# Patient Record
Sex: Male | Born: 1940 | Race: White | Hispanic: No | Smoking: Former smoker
Health system: Southern US, Community
[De-identification: ages and names within clinical notes are randomized; demographics above are authoritative.]

## PROBLEM LIST (undated history)

## (undated) DIAGNOSIS — N183 Chronic kidney disease, stage 3 unspecified: Secondary | ICD-10-CM

## (undated) DIAGNOSIS — I10 Essential (primary) hypertension: Secondary | ICD-10-CM

## (undated) DIAGNOSIS — G4730 Sleep apnea, unspecified: Secondary | ICD-10-CM

## (undated) DIAGNOSIS — R42 Dizziness and giddiness: Secondary | ICD-10-CM

## (undated) DIAGNOSIS — K219 Gastro-esophageal reflux disease without esophagitis: Secondary | ICD-10-CM

## (undated) DIAGNOSIS — E785 Hyperlipidemia, unspecified: Secondary | ICD-10-CM

## (undated) DIAGNOSIS — I82409 Acute embolism and thrombosis of unspecified deep veins of unspecified lower extremity: Secondary | ICD-10-CM

## (undated) DIAGNOSIS — E669 Obesity, unspecified: Secondary | ICD-10-CM

## (undated) DIAGNOSIS — N200 Calculus of kidney: Secondary | ICD-10-CM

## (undated) DIAGNOSIS — R011 Cardiac murmur, unspecified: Secondary | ICD-10-CM

## (undated) DIAGNOSIS — E119 Type 2 diabetes mellitus without complications: Secondary | ICD-10-CM

## (undated) DIAGNOSIS — M720 Palmar fascial fibromatosis [Dupuytren]: Secondary | ICD-10-CM

## (undated) DIAGNOSIS — I6529 Occlusion and stenosis of unspecified carotid artery: Secondary | ICD-10-CM

## (undated) DIAGNOSIS — I2699 Other pulmonary embolism without acute cor pulmonale: Secondary | ICD-10-CM

## (undated) DIAGNOSIS — T4145XA Adverse effect of unspecified anesthetic, initial encounter: Secondary | ICD-10-CM

## (undated) DIAGNOSIS — M1990 Unspecified osteoarthritis, unspecified site: Secondary | ICD-10-CM

## (undated) DIAGNOSIS — I2510 Atherosclerotic heart disease of native coronary artery without angina pectoris: Secondary | ICD-10-CM

## (undated) DIAGNOSIS — T8859XA Other complications of anesthesia, initial encounter: Secondary | ICD-10-CM

## (undated) DIAGNOSIS — I5022 Chronic systolic (congestive) heart failure: Secondary | ICD-10-CM

## (undated) DIAGNOSIS — Z95810 Presence of automatic (implantable) cardiac defibrillator: Secondary | ICD-10-CM

## (undated) DIAGNOSIS — I255 Ischemic cardiomyopathy: Secondary | ICD-10-CM

## (undated) HISTORY — DX: Hyperlipidemia, unspecified: E78.5

## (undated) HISTORY — DX: Essential (primary) hypertension: I10

## (undated) HISTORY — PX: CARDIAC CATHETERIZATION: SHX172

## (undated) HISTORY — DX: Occlusion and stenosis of unspecified carotid artery: I65.29

## (undated) HISTORY — PX: EYE SURGERY: SHX253

## (undated) HISTORY — DX: Obesity, unspecified: E66.9

## (undated) HISTORY — PX: LITHOTRIPSY: SUR834

## (undated) HISTORY — DX: Calculus of kidney: N20.0

## (undated) HISTORY — DX: Sleep apnea, unspecified: G47.30

## (undated) HISTORY — PX: CYSTOSCOPY W/ STONE MANIPULATION: SHX1427

## (undated) HISTORY — PX: CORONARY ARTERY BYPASS GRAFT: SHX141

## (undated) HISTORY — DX: Dizziness and giddiness: R42

## (undated) HISTORY — PX: TONSILLECTOMY: SUR1361

## (undated) HISTORY — DX: Palmar fascial fibromatosis (dupuytren): M72.0

## (undated) HISTORY — DX: Atherosclerotic heart disease of native coronary artery without angina pectoris: I25.10

---

## 2003-09-26 HISTORY — PX: CATARACT EXTRACTION W/ INTRAOCULAR LENS  IMPLANT, BILATERAL: SHX1307

## 2003-09-26 HISTORY — PX: CARDIAC DEFIBRILLATOR PLACEMENT: SHX171

## 2004-07-27 ENCOUNTER — Ambulatory Visit: Payer: Self-pay | Admitting: Internal Medicine

## 2004-08-02 ENCOUNTER — Ambulatory Visit: Payer: Self-pay | Admitting: Internal Medicine

## 2004-08-10 ENCOUNTER — Ambulatory Visit: Payer: Self-pay | Admitting: Internal Medicine

## 2004-08-24 ENCOUNTER — Ambulatory Visit: Payer: Self-pay | Admitting: Internal Medicine

## 2004-08-31 ENCOUNTER — Ambulatory Visit: Payer: Self-pay | Admitting: Internal Medicine

## 2004-09-08 ENCOUNTER — Ambulatory Visit: Payer: Self-pay

## 2004-09-16 ENCOUNTER — Ambulatory Visit: Payer: Self-pay | Admitting: Internal Medicine

## 2004-09-27 ENCOUNTER — Ambulatory Visit: Payer: Self-pay | Admitting: Internal Medicine

## 2004-09-28 ENCOUNTER — Ambulatory Visit: Payer: Self-pay | Admitting: Internal Medicine

## 2004-09-29 ENCOUNTER — Ambulatory Visit: Payer: Self-pay | Admitting: Internal Medicine

## 2004-10-28 ENCOUNTER — Ambulatory Visit: Payer: Self-pay | Admitting: Internal Medicine

## 2004-11-14 ENCOUNTER — Ambulatory Visit: Payer: Self-pay | Admitting: Gastroenterology

## 2004-11-21 ENCOUNTER — Ambulatory Visit: Payer: Self-pay | Admitting: Internal Medicine

## 2004-11-25 ENCOUNTER — Ambulatory Visit (HOSPITAL_COMMUNITY): Admission: RE | Admit: 2004-11-25 | Discharge: 2004-11-25 | Payer: Self-pay | Admitting: Gastroenterology

## 2004-11-25 ENCOUNTER — Ambulatory Visit: Payer: Self-pay | Admitting: Gastroenterology

## 2004-12-05 ENCOUNTER — Ambulatory Visit: Payer: Self-pay | Admitting: Internal Medicine

## 2004-12-12 ENCOUNTER — Ambulatory Visit: Payer: Self-pay | Admitting: Family Medicine

## 2004-12-16 ENCOUNTER — Ambulatory Visit: Payer: Self-pay | Admitting: Gastroenterology

## 2004-12-19 ENCOUNTER — Ambulatory Visit: Payer: Self-pay | Admitting: Internal Medicine

## 2005-01-16 ENCOUNTER — Ambulatory Visit: Payer: Self-pay | Admitting: Internal Medicine

## 2005-01-27 ENCOUNTER — Ambulatory Visit: Payer: Self-pay | Admitting: Internal Medicine

## 2005-02-03 ENCOUNTER — Ambulatory Visit: Payer: Self-pay | Admitting: Family Medicine

## 2005-02-09 ENCOUNTER — Ambulatory Visit: Payer: Self-pay | Admitting: Internal Medicine

## 2005-03-13 ENCOUNTER — Ambulatory Visit: Payer: Self-pay | Admitting: Internal Medicine

## 2005-03-15 ENCOUNTER — Ambulatory Visit: Payer: Self-pay | Admitting: Internal Medicine

## 2005-03-21 ENCOUNTER — Ambulatory Visit: Payer: Self-pay | Admitting: Internal Medicine

## 2005-04-28 ENCOUNTER — Ambulatory Visit: Payer: Self-pay | Admitting: Internal Medicine

## 2005-05-16 ENCOUNTER — Ambulatory Visit: Payer: Self-pay | Admitting: Internal Medicine

## 2005-05-19 ENCOUNTER — Ambulatory Visit: Payer: Self-pay | Admitting: Internal Medicine

## 2005-06-16 ENCOUNTER — Ambulatory Visit: Payer: Self-pay | Admitting: Internal Medicine

## 2005-06-26 ENCOUNTER — Ambulatory Visit: Payer: Self-pay

## 2005-07-04 ENCOUNTER — Ambulatory Visit: Payer: Self-pay | Admitting: Internal Medicine

## 2005-07-12 ENCOUNTER — Ambulatory Visit: Payer: Self-pay | Admitting: Internal Medicine

## 2005-08-10 ENCOUNTER — Ambulatory Visit: Payer: Self-pay | Admitting: Internal Medicine

## 2005-08-29 ENCOUNTER — Ambulatory Visit: Payer: Self-pay | Admitting: Internal Medicine

## 2005-09-05 ENCOUNTER — Ambulatory Visit: Payer: Self-pay | Admitting: Internal Medicine

## 2005-09-28 ENCOUNTER — Ambulatory Visit: Payer: Self-pay | Admitting: Internal Medicine

## 2005-10-26 ENCOUNTER — Ambulatory Visit: Payer: Self-pay | Admitting: Internal Medicine

## 2005-11-07 ENCOUNTER — Ambulatory Visit: Payer: Self-pay | Admitting: Internal Medicine

## 2005-11-27 ENCOUNTER — Ambulatory Visit: Payer: Self-pay | Admitting: Internal Medicine

## 2005-12-14 ENCOUNTER — Ambulatory Visit: Payer: Self-pay | Admitting: Family Medicine

## 2005-12-15 ENCOUNTER — Ambulatory Visit: Payer: Self-pay | Admitting: Family Medicine

## 2005-12-18 ENCOUNTER — Ambulatory Visit: Payer: Self-pay | Admitting: Cardiology

## 2005-12-25 ENCOUNTER — Ambulatory Visit: Payer: Self-pay | Admitting: Internal Medicine

## 2005-12-28 ENCOUNTER — Ambulatory Visit: Payer: Self-pay

## 2005-12-28 ENCOUNTER — Encounter: Payer: Self-pay | Admitting: Internal Medicine

## 2006-01-01 ENCOUNTER — Ambulatory Visit: Payer: Self-pay | Admitting: Internal Medicine

## 2006-01-08 ENCOUNTER — Ambulatory Visit: Payer: Self-pay | Admitting: Internal Medicine

## 2006-01-12 ENCOUNTER — Ambulatory Visit: Payer: Self-pay | Admitting: Internal Medicine

## 2006-01-30 ENCOUNTER — Ambulatory Visit (HOSPITAL_COMMUNITY): Admission: RE | Admit: 2006-01-30 | Discharge: 2006-01-30 | Payer: Self-pay | Admitting: Neurology

## 2006-02-05 ENCOUNTER — Ambulatory Visit: Payer: Self-pay | Admitting: Internal Medicine

## 2006-02-08 ENCOUNTER — Encounter: Payer: Self-pay | Admitting: Internal Medicine

## 2006-02-13 ENCOUNTER — Ambulatory Visit (HOSPITAL_COMMUNITY): Admission: RE | Admit: 2006-02-13 | Discharge: 2006-02-13 | Payer: Self-pay | Admitting: Neurology

## 2006-02-20 ENCOUNTER — Ambulatory Visit: Payer: Self-pay | Admitting: Internal Medicine

## 2006-03-21 ENCOUNTER — Ambulatory Visit: Payer: Self-pay | Admitting: Internal Medicine

## 2006-04-03 ENCOUNTER — Ambulatory Visit: Payer: Self-pay | Admitting: Internal Medicine

## 2006-04-19 ENCOUNTER — Ambulatory Visit: Payer: Self-pay | Admitting: Internal Medicine

## 2006-04-27 ENCOUNTER — Ambulatory Visit: Payer: Self-pay | Admitting: Internal Medicine

## 2006-05-31 ENCOUNTER — Ambulatory Visit: Payer: Self-pay | Admitting: Internal Medicine

## 2006-07-05 ENCOUNTER — Encounter: Payer: Self-pay | Admitting: Internal Medicine

## 2006-08-02 ENCOUNTER — Ambulatory Visit: Payer: Self-pay | Admitting: Internal Medicine

## 2006-08-20 ENCOUNTER — Ambulatory Visit: Payer: Self-pay | Admitting: Internal Medicine

## 2006-08-28 ENCOUNTER — Ambulatory Visit: Payer: Self-pay | Admitting: Internal Medicine

## 2006-08-28 ENCOUNTER — Encounter: Payer: Self-pay | Admitting: Internal Medicine

## 2006-08-28 ENCOUNTER — Ambulatory Visit: Payer: Self-pay

## 2006-09-14 ENCOUNTER — Ambulatory Visit: Payer: Self-pay | Admitting: Internal Medicine

## 2006-10-03 DIAGNOSIS — I2699 Other pulmonary embolism without acute cor pulmonale: Secondary | ICD-10-CM

## 2006-10-03 DIAGNOSIS — M720 Palmar fascial fibromatosis [Dupuytren]: Secondary | ICD-10-CM | POA: Insufficient documentation

## 2006-10-03 DIAGNOSIS — Z87442 Personal history of urinary calculi: Secondary | ICD-10-CM | POA: Insufficient documentation

## 2006-10-04 ENCOUNTER — Ambulatory Visit: Payer: Self-pay | Admitting: Internal Medicine

## 2006-10-22 ENCOUNTER — Ambulatory Visit: Payer: Self-pay | Admitting: Internal Medicine

## 2006-12-31 ENCOUNTER — Ambulatory Visit: Payer: Self-pay | Admitting: Internal Medicine

## 2007-01-03 ENCOUNTER — Ambulatory Visit: Payer: Self-pay | Admitting: Internal Medicine

## 2007-01-11 ENCOUNTER — Ambulatory Visit: Payer: Self-pay | Admitting: Internal Medicine

## 2007-01-11 LAB — CONVERTED CEMR LAB: INR: 2

## 2007-02-06 ENCOUNTER — Ambulatory Visit: Payer: Self-pay | Admitting: Internal Medicine

## 2007-02-08 ENCOUNTER — Ambulatory Visit: Payer: Self-pay | Admitting: Internal Medicine

## 2007-02-08 LAB — CONVERTED CEMR LAB: Prothrombin Time: 27.6 s — ABNORMAL HIGH (ref 11.6–15.2)

## 2007-02-14 ENCOUNTER — Ambulatory Visit: Payer: Self-pay | Admitting: Internal Medicine

## 2007-02-14 ENCOUNTER — Ambulatory Visit: Payer: Self-pay

## 2007-02-14 LAB — CONVERTED CEMR LAB
AST: 19 units/L (ref 0–37)
Albumin: 3.7 g/dL (ref 3.5–5.2)
Alkaline Phosphatase: 52 units/L (ref 39–117)
BUN: 26 mg/dL — ABNORMAL HIGH (ref 6–23)
CO2: 28 meq/L (ref 19–32)
Calcium: 9.6 mg/dL (ref 8.4–10.5)
Chloride: 107 meq/L (ref 96–112)
Potassium: 4.4 meq/L (ref 3.5–5.1)
Sodium: 142 meq/L (ref 135–145)
Total Bilirubin: 0.9 mg/dL (ref 0.3–1.2)
Total CHOL/HDL Ratio: 4.2
Triglycerides: 190 mg/dL — ABNORMAL HIGH (ref 0–149)

## 2007-03-21 ENCOUNTER — Ambulatory Visit: Payer: Self-pay | Admitting: Internal Medicine

## 2007-03-21 LAB — CONVERTED CEMR LAB: INR: 4.5

## 2007-03-25 ENCOUNTER — Encounter: Payer: Self-pay | Admitting: Internal Medicine

## 2007-03-26 ENCOUNTER — Ambulatory Visit: Payer: Self-pay | Admitting: Internal Medicine

## 2007-04-04 ENCOUNTER — Ambulatory Visit: Payer: Self-pay | Admitting: Internal Medicine

## 2007-04-04 LAB — CONVERTED CEMR LAB: INR: 2.8

## 2007-04-23 ENCOUNTER — Ambulatory Visit: Payer: Self-pay | Admitting: Internal Medicine

## 2007-04-25 ENCOUNTER — Ambulatory Visit: Payer: Self-pay | Admitting: Family Medicine

## 2007-06-04 ENCOUNTER — Ambulatory Visit: Payer: Self-pay | Admitting: Internal Medicine

## 2007-06-04 LAB — CONVERTED CEMR LAB: INR: 1.9

## 2007-06-06 ENCOUNTER — Encounter: Payer: Self-pay | Admitting: Internal Medicine

## 2007-07-04 ENCOUNTER — Telehealth (INDEPENDENT_AMBULATORY_CARE_PROVIDER_SITE_OTHER): Payer: Self-pay | Admitting: *Deleted

## 2007-07-04 ENCOUNTER — Ambulatory Visit: Payer: Self-pay | Admitting: Internal Medicine

## 2007-07-16 ENCOUNTER — Ambulatory Visit: Payer: Self-pay | Admitting: Internal Medicine

## 2007-07-17 ENCOUNTER — Encounter: Payer: Self-pay | Admitting: Internal Medicine

## 2007-07-30 ENCOUNTER — Ambulatory Visit: Payer: Self-pay | Admitting: Internal Medicine

## 2007-07-30 LAB — CONVERTED CEMR LAB
ALT: 18 units/L (ref 0–53)
Albumin: 3.9 g/dL (ref 3.5–5.2)
GFR calc non Af Amer: 38 mL/min
HDL: 35.6 mg/dL — ABNORMAL LOW (ref >39.0)
LDL Cholesterol: 47 mg/dL (ref 0–99)
Potassium: 4.3 meq/L (ref 3.5–5.1)
Sodium: 141 meq/L (ref 135–145)
Triglycerides: 149 mg/dL (ref 0–149)
VLDL: 30 mg/dL (ref 0–40)

## 2007-07-31 ENCOUNTER — Ambulatory Visit: Payer: Self-pay | Admitting: Internal Medicine

## 2007-08-12 ENCOUNTER — Ambulatory Visit: Payer: Self-pay | Admitting: Internal Medicine

## 2007-08-23 ENCOUNTER — Telehealth (INDEPENDENT_AMBULATORY_CARE_PROVIDER_SITE_OTHER): Payer: Self-pay | Admitting: *Deleted

## 2007-09-09 ENCOUNTER — Ambulatory Visit: Payer: Self-pay | Admitting: Internal Medicine

## 2007-10-03 ENCOUNTER — Ambulatory Visit: Payer: Self-pay | Admitting: Internal Medicine

## 2007-10-22 ENCOUNTER — Ambulatory Visit: Payer: Self-pay | Admitting: Internal Medicine

## 2007-10-30 ENCOUNTER — Encounter: Payer: Self-pay | Admitting: Internal Medicine

## 2007-11-04 ENCOUNTER — Encounter: Payer: Self-pay | Admitting: Internal Medicine

## 2007-12-02 ENCOUNTER — Ambulatory Visit: Payer: Self-pay | Admitting: Internal Medicine

## 2007-12-02 LAB — CONVERTED CEMR LAB
Albumin: 3.5 g/dL (ref 3.5–5.2)
Alkaline Phosphatase: 35 units/L — ABNORMAL LOW (ref 39–117)
BUN: 23 mg/dL (ref 6–23)
Bilirubin, Direct: 0.1 mg/dL (ref 0.0–0.3)
Cholesterol: 126 mg/dL (ref 0–200)
GFR calc Af Amer: 52 mL/min
HDL: 34.7 mg/dL — ABNORMAL LOW (ref >39.0)
Potassium: 4.5 meq/L (ref 3.5–5.1)
Triglycerides: 200 mg/dL — ABNORMAL HIGH (ref 0–149)
VLDL: 40 mg/dL (ref 0–40)

## 2007-12-03 ENCOUNTER — Ambulatory Visit: Payer: Self-pay | Admitting: Internal Medicine

## 2007-12-13 ENCOUNTER — Telehealth (INDEPENDENT_AMBULATORY_CARE_PROVIDER_SITE_OTHER): Payer: Self-pay | Admitting: *Deleted

## 2007-12-26 ENCOUNTER — Ambulatory Visit: Payer: Self-pay | Admitting: Internal Medicine

## 2007-12-26 DIAGNOSIS — R519 Headache, unspecified: Secondary | ICD-10-CM | POA: Insufficient documentation

## 2007-12-26 DIAGNOSIS — E119 Type 2 diabetes mellitus without complications: Secondary | ICD-10-CM

## 2007-12-26 DIAGNOSIS — K219 Gastro-esophageal reflux disease without esophagitis: Secondary | ICD-10-CM

## 2007-12-26 DIAGNOSIS — N259 Disorder resulting from impaired renal tubular function, unspecified: Secondary | ICD-10-CM | POA: Insufficient documentation

## 2007-12-26 DIAGNOSIS — R42 Dizziness and giddiness: Secondary | ICD-10-CM

## 2007-12-26 DIAGNOSIS — R51 Headache: Secondary | ICD-10-CM

## 2007-12-31 ENCOUNTER — Telehealth (INDEPENDENT_AMBULATORY_CARE_PROVIDER_SITE_OTHER): Payer: Self-pay | Admitting: *Deleted

## 2007-12-31 LAB — CONVERTED CEMR LAB: Hgb A1c MFr Bld: 7.3 % — ABNORMAL HIGH (ref 4.6–6.0)

## 2008-01-02 ENCOUNTER — Ambulatory Visit: Payer: Self-pay | Admitting: Internal Medicine

## 2008-01-06 ENCOUNTER — Ambulatory Visit: Payer: Self-pay | Admitting: Internal Medicine

## 2008-01-06 LAB — CONVERTED CEMR LAB: INR: 4

## 2008-01-16 ENCOUNTER — Telehealth (INDEPENDENT_AMBULATORY_CARE_PROVIDER_SITE_OTHER): Payer: Self-pay | Admitting: *Deleted

## 2008-02-06 ENCOUNTER — Ambulatory Visit: Payer: Self-pay | Admitting: Internal Medicine

## 2008-02-10 ENCOUNTER — Ambulatory Visit: Payer: Self-pay | Admitting: Internal Medicine

## 2008-02-10 LAB — CONVERTED CEMR LAB: INR: 3.3

## 2008-02-11 ENCOUNTER — Encounter: Payer: Self-pay | Admitting: Internal Medicine

## 2008-02-25 ENCOUNTER — Ambulatory Visit: Payer: Self-pay | Admitting: Internal Medicine

## 2008-02-25 LAB — CONVERTED CEMR LAB: INR: 2.1

## 2008-03-03 ENCOUNTER — Encounter: Admission: RE | Admit: 2008-03-03 | Discharge: 2008-03-03 | Payer: Self-pay | Admitting: Internal Medicine

## 2008-03-05 ENCOUNTER — Encounter: Payer: Self-pay | Admitting: Internal Medicine

## 2008-03-24 ENCOUNTER — Ambulatory Visit: Payer: Self-pay | Admitting: Internal Medicine

## 2008-03-24 LAB — CONVERTED CEMR LAB: INR: 1.9

## 2008-03-31 ENCOUNTER — Ambulatory Visit: Payer: Self-pay | Admitting: Internal Medicine

## 2008-04-07 ENCOUNTER — Ambulatory Visit: Payer: Self-pay | Admitting: Internal Medicine

## 2008-04-07 LAB — CONVERTED CEMR LAB: INR: 18.1

## 2008-05-05 ENCOUNTER — Ambulatory Visit: Payer: Self-pay | Admitting: Internal Medicine

## 2008-05-05 LAB — CONVERTED CEMR LAB: INR: 2.9

## 2008-06-08 ENCOUNTER — Ambulatory Visit: Payer: Self-pay | Admitting: Internal Medicine

## 2008-06-08 LAB — CONVERTED CEMR LAB
INR: 20.8
Prothrombin Time: 3 s

## 2008-06-25 ENCOUNTER — Ambulatory Visit: Payer: Self-pay | Admitting: Internal Medicine

## 2008-07-17 ENCOUNTER — Ambulatory Visit: Payer: Self-pay | Admitting: Internal Medicine

## 2008-07-17 LAB — CONVERTED CEMR LAB: Prothrombin Time: 19.7 s

## 2008-08-05 ENCOUNTER — Ambulatory Visit: Payer: Self-pay | Admitting: Internal Medicine

## 2008-08-11 ENCOUNTER — Ambulatory Visit: Payer: Self-pay

## 2008-08-11 ENCOUNTER — Encounter: Payer: Self-pay | Admitting: Internal Medicine

## 2008-08-18 ENCOUNTER — Telehealth: Payer: Self-pay | Admitting: Internal Medicine

## 2008-08-21 ENCOUNTER — Ambulatory Visit: Payer: Self-pay | Admitting: Internal Medicine

## 2008-08-24 ENCOUNTER — Ambulatory Visit: Payer: Self-pay | Admitting: Internal Medicine

## 2008-08-24 LAB — CONVERTED CEMR LAB: Prothrombin Time: 18 s

## 2008-08-28 ENCOUNTER — Ambulatory Visit: Payer: Self-pay

## 2008-08-28 ENCOUNTER — Ambulatory Visit: Payer: Self-pay | Admitting: Internal Medicine

## 2008-08-28 ENCOUNTER — Encounter: Payer: Self-pay | Admitting: Internal Medicine

## 2008-08-28 LAB — CONVERTED CEMR LAB
AST: 21 units/L (ref 0–37)
Albumin: 3.7 g/dL (ref 3.5–5.2)
BUN: 26 mg/dL — ABNORMAL HIGH (ref 6–23)
Calcium: 9.2 mg/dL (ref 8.4–10.5)
Cholesterol: 131 mg/dL (ref 0–200)
Creatinine, Ser: 1.7 mg/dL — ABNORMAL HIGH (ref 0.4–1.5)
Eosinophils Absolute: 0.2 10*3/uL (ref 0.0–0.7)
Eosinophils Relative: 4.6 % (ref 0.0–5.0)
GFR calc Af Amer: 52 mL/min
GFR calc non Af Amer: 43 mL/min
HCT: 40 % (ref 39.0–52.0)
MCV: 91.7 fL (ref 78.0–100.0)
Monocytes Absolute: 0.5 10*3/uL (ref 0.1–1.0)
Monocytes Relative: 9.7 % (ref 3.0–12.0)
Neutro Abs: 3.2 10*3/uL (ref 1.4–7.7)
RBC: 4.37 M/uL (ref 4.22–5.81)
Total Bilirubin: 0.8 mg/dL (ref 0.3–1.2)
WBC: 5.1 10*3/uL (ref 4.5–10.5)

## 2008-10-01 ENCOUNTER — Ambulatory Visit: Payer: Self-pay | Admitting: Internal Medicine

## 2008-10-06 ENCOUNTER — Ambulatory Visit: Payer: Self-pay | Admitting: Internal Medicine

## 2008-10-06 LAB — CONVERTED CEMR LAB: Prothrombin Time: 22 s

## 2008-10-20 ENCOUNTER — Ambulatory Visit: Payer: Self-pay | Admitting: Internal Medicine

## 2008-10-20 LAB — CONVERTED CEMR LAB: Prothrombin Time: 21.8 s

## 2008-11-10 ENCOUNTER — Ambulatory Visit: Payer: Self-pay | Admitting: Internal Medicine

## 2008-11-10 LAB — CONVERTED CEMR LAB: Prothrombin Time: 18.2 s

## 2008-11-17 ENCOUNTER — Ambulatory Visit: Payer: Self-pay | Admitting: Internal Medicine

## 2008-11-17 ENCOUNTER — Encounter: Payer: Self-pay | Admitting: Internal Medicine

## 2008-11-17 DIAGNOSIS — I25810 Atherosclerosis of coronary artery bypass graft(s) without angina pectoris: Secondary | ICD-10-CM

## 2008-11-17 DIAGNOSIS — I5022 Chronic systolic (congestive) heart failure: Secondary | ICD-10-CM

## 2008-11-17 DIAGNOSIS — E785 Hyperlipidemia, unspecified: Secondary | ICD-10-CM | POA: Insufficient documentation

## 2008-11-24 ENCOUNTER — Ambulatory Visit: Payer: Self-pay | Admitting: Internal Medicine

## 2008-11-24 LAB — CONVERTED CEMR LAB
ALT: 38 units/L (ref 0–53)
Alkaline Phosphatase: 33 units/L — ABNORMAL LOW (ref 39–117)
Bilirubin, Direct: 0.1 mg/dL (ref 0.0–0.3)
CO2: 26 meq/L (ref 19–32)
Chloride: 107 meq/L (ref 96–112)
GFR calc Af Amer: 56 mL/min
Pro B Natriuretic peptide (BNP): 82 pg/mL (ref 0.0–100.0)
Sodium: 140 meq/L (ref 135–145)
Total Protein: 7.1 g/dL (ref 6.0–8.3)

## 2008-12-01 ENCOUNTER — Ambulatory Visit: Payer: Self-pay | Admitting: Internal Medicine

## 2008-12-01 LAB — CONVERTED CEMR LAB
BUN: 38 mg/dL — ABNORMAL HIGH (ref 6–23)
Calcium: 9.4 mg/dL (ref 8.4–10.5)
Creatinine, Ser: 1.6 mg/dL — ABNORMAL HIGH (ref 0.4–1.5)
Glucose, Bld: 194 mg/dL — ABNORMAL HIGH (ref 70–99)
Sodium: 138 meq/L (ref 135–145)

## 2008-12-15 ENCOUNTER — Ambulatory Visit: Payer: Self-pay | Admitting: Internal Medicine

## 2008-12-15 LAB — CONVERTED CEMR LAB
BUN: 29 mg/dL — ABNORMAL HIGH (ref 6–23)
Calcium: 9.6 mg/dL (ref 8.4–10.5)
Creatinine, Ser: 1.6 mg/dL — ABNORMAL HIGH (ref 0.4–1.5)
Glucose, Bld: 164 mg/dL — ABNORMAL HIGH (ref 70–99)
Pro B Natriuretic peptide (BNP): 153 pg/mL — ABNORMAL HIGH (ref 0.0–100.0)
Sodium: 141 meq/L (ref 135–145)

## 2008-12-16 ENCOUNTER — Ambulatory Visit: Payer: Self-pay | Admitting: Internal Medicine

## 2008-12-22 ENCOUNTER — Encounter: Payer: Self-pay | Admitting: Internal Medicine

## 2008-12-31 ENCOUNTER — Ambulatory Visit: Payer: Self-pay | Admitting: Internal Medicine

## 2009-01-27 ENCOUNTER — Ambulatory Visit: Payer: Self-pay | Admitting: Internal Medicine

## 2009-01-27 LAB — CONVERTED CEMR LAB: INR: 2.7

## 2009-02-10 ENCOUNTER — Encounter: Payer: Self-pay | Admitting: Internal Medicine

## 2009-02-16 ENCOUNTER — Ambulatory Visit: Payer: Self-pay | Admitting: Internal Medicine

## 2009-02-16 ENCOUNTER — Encounter: Payer: Self-pay | Admitting: Internal Medicine

## 2009-02-18 ENCOUNTER — Ambulatory Visit: Payer: Self-pay | Admitting: Internal Medicine

## 2009-02-18 ENCOUNTER — Encounter: Payer: Self-pay | Admitting: Internal Medicine

## 2009-02-24 ENCOUNTER — Encounter: Payer: Self-pay | Admitting: Internal Medicine

## 2009-03-18 ENCOUNTER — Ambulatory Visit: Payer: Self-pay | Admitting: Family Medicine

## 2009-03-23 ENCOUNTER — Ambulatory Visit: Payer: Self-pay | Admitting: Internal Medicine

## 2009-03-24 ENCOUNTER — Ambulatory Visit: Payer: Self-pay | Admitting: Internal Medicine

## 2009-03-24 LAB — CONVERTED CEMR LAB
INR: 2.4
Prothrombin Time: 18.8 s

## 2009-04-23 ENCOUNTER — Ambulatory Visit: Payer: Self-pay | Admitting: Internal Medicine

## 2009-04-23 LAB — CONVERTED CEMR LAB: Prothrombin Time: 33.1 s

## 2009-04-26 ENCOUNTER — Ambulatory Visit: Payer: Self-pay | Admitting: Internal Medicine

## 2009-04-26 LAB — CONVERTED CEMR LAB
INR: 3.3
Prothrombin Time: 21.8 s

## 2009-05-10 ENCOUNTER — Ambulatory Visit: Payer: Self-pay | Admitting: Internal Medicine

## 2009-05-13 ENCOUNTER — Telehealth: Payer: Self-pay | Admitting: Internal Medicine

## 2009-05-20 ENCOUNTER — Ambulatory Visit: Payer: Self-pay | Admitting: Internal Medicine

## 2009-05-20 LAB — CONVERTED CEMR LAB
INR: 2.3
Prothrombin Time: 18.4 s

## 2009-06-02 ENCOUNTER — Ambulatory Visit: Payer: Self-pay | Admitting: Family Medicine

## 2009-06-02 LAB — CONVERTED CEMR LAB: INR: 2.6

## 2009-06-21 ENCOUNTER — Ambulatory Visit: Payer: Self-pay | Admitting: Internal Medicine

## 2009-06-21 LAB — CONVERTED CEMR LAB: INR: 1.6

## 2009-06-24 ENCOUNTER — Ambulatory Visit: Payer: Self-pay | Admitting: Internal Medicine

## 2009-06-28 ENCOUNTER — Ambulatory Visit: Payer: Self-pay | Admitting: Internal Medicine

## 2009-07-20 ENCOUNTER — Ambulatory Visit: Payer: Self-pay | Admitting: Internal Medicine

## 2009-07-20 LAB — CONVERTED CEMR LAB
INR: 1.7
Prothrombin Time: 16.2 s

## 2009-08-10 ENCOUNTER — Ambulatory Visit: Payer: Self-pay | Admitting: Internal Medicine

## 2009-08-10 LAB — CONVERTED CEMR LAB: Prothrombin Time: 15.6 s

## 2009-08-13 ENCOUNTER — Encounter (INDEPENDENT_AMBULATORY_CARE_PROVIDER_SITE_OTHER): Payer: Self-pay | Admitting: *Deleted

## 2009-08-31 ENCOUNTER — Ambulatory Visit: Payer: Self-pay | Admitting: Internal Medicine

## 2009-08-31 LAB — CONVERTED CEMR LAB
INR: 2
Prothrombin Time: 17.3 s

## 2009-09-16 ENCOUNTER — Telehealth: Payer: Self-pay | Admitting: Internal Medicine

## 2009-09-21 ENCOUNTER — Ambulatory Visit: Payer: Self-pay | Admitting: Internal Medicine

## 2009-09-21 LAB — CONVERTED CEMR LAB: INR: 1.7

## 2009-09-23 ENCOUNTER — Ambulatory Visit: Payer: Self-pay | Admitting: Internal Medicine

## 2009-10-04 ENCOUNTER — Ambulatory Visit: Payer: Self-pay | Admitting: Internal Medicine

## 2009-10-11 ENCOUNTER — Telehealth (INDEPENDENT_AMBULATORY_CARE_PROVIDER_SITE_OTHER): Payer: Self-pay | Admitting: *Deleted

## 2009-10-12 ENCOUNTER — Ambulatory Visit: Payer: Self-pay | Admitting: Internal Medicine

## 2009-10-13 ENCOUNTER — Telehealth (INDEPENDENT_AMBULATORY_CARE_PROVIDER_SITE_OTHER): Payer: Self-pay | Admitting: *Deleted

## 2009-10-18 ENCOUNTER — Ambulatory Visit: Payer: Self-pay | Admitting: Internal Medicine

## 2009-10-18 LAB — CONVERTED CEMR LAB
INR: 16.5
Prothrombin Time: 1.8 s

## 2009-10-22 ENCOUNTER — Encounter: Payer: Self-pay | Admitting: Internal Medicine

## 2009-10-22 ENCOUNTER — Ambulatory Visit: Payer: Self-pay | Admitting: Internal Medicine

## 2009-10-22 ENCOUNTER — Ambulatory Visit (HOSPITAL_COMMUNITY): Admission: RE | Admit: 2009-10-22 | Discharge: 2009-10-22 | Payer: Self-pay | Admitting: Internal Medicine

## 2009-10-22 ENCOUNTER — Ambulatory Visit: Payer: Self-pay

## 2009-10-26 LAB — CONVERTED CEMR LAB
CO2: 28 meq/L (ref 19–32)
Chloride: 108 meq/L (ref 96–112)
Potassium: 5.3 meq/L — ABNORMAL HIGH (ref 3.5–5.1)
Pro B Natriuretic peptide (BNP): 84 pg/mL (ref 0.0–100.0)

## 2009-11-01 ENCOUNTER — Ambulatory Visit: Payer: Self-pay | Admitting: Internal Medicine

## 2009-11-04 LAB — CONVERTED CEMR LAB
INR: 2.02 — ABNORMAL HIGH (ref <1.50)
Prothrombin Time: 22.7 s — ABNORMAL HIGH (ref 11.6–15.2)

## 2009-11-05 ENCOUNTER — Telehealth (INDEPENDENT_AMBULATORY_CARE_PROVIDER_SITE_OTHER): Payer: Self-pay | Admitting: *Deleted

## 2009-11-08 ENCOUNTER — Ambulatory Visit: Payer: Self-pay | Admitting: Internal Medicine

## 2009-11-10 ENCOUNTER — Encounter: Payer: Self-pay | Admitting: Internal Medicine

## 2009-11-11 ENCOUNTER — Ambulatory Visit: Payer: Self-pay | Admitting: Diagnostic Radiology

## 2009-11-11 ENCOUNTER — Ambulatory Visit: Payer: Self-pay | Admitting: Internal Medicine

## 2009-11-11 ENCOUNTER — Ambulatory Visit (HOSPITAL_BASED_OUTPATIENT_CLINIC_OR_DEPARTMENT_OTHER): Admission: RE | Admit: 2009-11-11 | Discharge: 2009-11-11 | Payer: Self-pay | Admitting: Certified Registered"

## 2009-11-19 LAB — CONVERTED CEMR LAB
GFR calc non Af Amer: 37.47 mL/min (ref >60)
Potassium: 5 meq/L (ref 3.5–5.1)
Sodium: 138 meq/L (ref 135–145)

## 2009-11-26 ENCOUNTER — Telehealth: Payer: Self-pay | Admitting: Internal Medicine

## 2009-11-26 ENCOUNTER — Ambulatory Visit: Payer: Self-pay | Admitting: Internal Medicine

## 2009-11-30 LAB — CONVERTED CEMR LAB
Basophils Absolute: 0 10*3/uL (ref 0.0–0.1)
Basophils Relative: 0.3 % (ref 0.0–3.0)
Eosinophils Absolute: 0.2 10*3/uL (ref 0.0–0.7)
Eosinophils Relative: 2.1 % (ref 0.0–5.0)
HCT: 40.9 % (ref 39.0–52.0)
Hemoglobin: 14 g/dL (ref 13.0–17.0)
Hgb A1c MFr Bld: 6.9 % — ABNORMAL HIGH (ref 4.6–6.5)
INR: 1.6 — ABNORMAL HIGH (ref 0.8–1.0)
Lymphocytes Relative: 16.3 % (ref 12.0–46.0)
Lymphs Abs: 1.2 10*3/uL (ref 0.7–4.0)
MCHC: 34.1 g/dL (ref 30.0–36.0)
MCV: 97.9 fL (ref 78.0–100.0)
Monocytes Absolute: 0.7 10*3/uL (ref 0.1–1.0)
Monocytes Relative: 9.7 % (ref 3.0–12.0)
Neutro Abs: 5.4 10*3/uL (ref 1.4–7.7)
Neutrophils Relative %: 71.6 % (ref 43.0–77.0)
PSA: 1.56 ng/mL (ref 0.10–4.00)
Platelets: 121 10*3/uL — ABNORMAL LOW (ref 150.0–400.0)
Prothrombin Time: 16.8 s — ABNORMAL HIGH (ref 9.1–11.7)
RBC: 4.18 M/uL — ABNORMAL LOW (ref 4.22–5.81)
RDW: 13.7 % (ref 11.5–14.6)
TSH: 1.06 microintl units/mL (ref 0.35–5.50)
WBC: 7.5 10*3/uL (ref 4.5–10.5)

## 2009-12-14 ENCOUNTER — Ambulatory Visit: Payer: Self-pay | Admitting: Internal Medicine

## 2009-12-23 ENCOUNTER — Ambulatory Visit: Payer: Self-pay | Admitting: Internal Medicine

## 2009-12-27 ENCOUNTER — Encounter: Payer: Self-pay | Admitting: Internal Medicine

## 2010-01-03 ENCOUNTER — Ambulatory Visit: Payer: Self-pay | Admitting: Internal Medicine

## 2010-01-31 ENCOUNTER — Ambulatory Visit: Payer: Self-pay | Admitting: Internal Medicine

## 2010-01-31 LAB — CONVERTED CEMR LAB: INR: 2.5

## 2010-02-07 ENCOUNTER — Ambulatory Visit: Payer: Self-pay | Admitting: Internal Medicine

## 2010-02-09 LAB — CONVERTED CEMR LAB: Microalb, Ur: 0.7 mg/dL (ref 0.0–1.9)

## 2010-02-16 ENCOUNTER — Encounter: Payer: Self-pay | Admitting: Internal Medicine

## 2010-03-07 ENCOUNTER — Ambulatory Visit: Payer: Self-pay | Admitting: Internal Medicine

## 2010-03-07 LAB — CONVERTED CEMR LAB: INR: 2.6

## 2010-03-16 ENCOUNTER — Encounter: Payer: Self-pay | Admitting: Internal Medicine

## 2010-03-22 ENCOUNTER — Ambulatory Visit: Payer: Self-pay | Admitting: Internal Medicine

## 2010-03-22 DIAGNOSIS — R5383 Other fatigue: Secondary | ICD-10-CM

## 2010-03-22 DIAGNOSIS — G4733 Obstructive sleep apnea (adult) (pediatric): Secondary | ICD-10-CM | POA: Insufficient documentation

## 2010-03-22 DIAGNOSIS — R5381 Other malaise: Secondary | ICD-10-CM | POA: Insufficient documentation

## 2010-03-31 ENCOUNTER — Encounter: Payer: Self-pay | Admitting: Internal Medicine

## 2010-03-31 DIAGNOSIS — G4730 Sleep apnea, unspecified: Secondary | ICD-10-CM

## 2010-03-31 HISTORY — DX: Sleep apnea, unspecified: G47.30

## 2010-04-04 ENCOUNTER — Ambulatory Visit: Payer: Self-pay | Admitting: Internal Medicine

## 2010-04-12 ENCOUNTER — Telehealth: Payer: Self-pay | Admitting: Internal Medicine

## 2010-04-13 ENCOUNTER — Telehealth: Payer: Self-pay | Admitting: Internal Medicine

## 2010-05-06 ENCOUNTER — Ambulatory Visit: Payer: Self-pay | Admitting: Pulmonary Disease

## 2010-05-06 DIAGNOSIS — G4700 Insomnia, unspecified: Secondary | ICD-10-CM | POA: Insufficient documentation

## 2010-05-09 ENCOUNTER — Telehealth: Payer: Self-pay | Admitting: Internal Medicine

## 2010-05-23 ENCOUNTER — Ambulatory Visit: Payer: Self-pay | Admitting: Internal Medicine

## 2010-05-23 LAB — CONVERTED CEMR LAB: INR: 3.3

## 2010-05-25 ENCOUNTER — Ambulatory Visit: Payer: Self-pay | Admitting: Internal Medicine

## 2010-05-27 ENCOUNTER — Encounter: Payer: Self-pay | Admitting: Pulmonary Disease

## 2010-06-01 ENCOUNTER — Ambulatory Visit: Payer: Self-pay | Admitting: Internal Medicine

## 2010-06-01 DIAGNOSIS — I2510 Atherosclerotic heart disease of native coronary artery without angina pectoris: Secondary | ICD-10-CM

## 2010-06-02 ENCOUNTER — Encounter: Payer: Self-pay | Admitting: Pulmonary Disease

## 2010-06-02 LAB — CONVERTED CEMR LAB
BUN: 38 mg/dL — ABNORMAL HIGH (ref 6–23)
CO2: 25 meq/L (ref 19–32)
Glucose, Bld: 140 mg/dL — ABNORMAL HIGH (ref 70–99)
Potassium: 4.4 meq/L (ref 3.5–5.1)
Sodium: 140 meq/L (ref 135–145)

## 2010-06-06 ENCOUNTER — Encounter: Payer: Self-pay | Admitting: Pulmonary Disease

## 2010-06-07 ENCOUNTER — Ambulatory Visit: Payer: Self-pay | Admitting: Internal Medicine

## 2010-06-30 ENCOUNTER — Encounter: Payer: Self-pay | Admitting: Internal Medicine

## 2010-07-05 ENCOUNTER — Ambulatory Visit: Payer: Self-pay | Admitting: Internal Medicine

## 2010-07-06 ENCOUNTER — Encounter: Payer: Self-pay | Admitting: Internal Medicine

## 2010-07-11 ENCOUNTER — Ambulatory Visit: Payer: Self-pay | Admitting: Pulmonary Disease

## 2010-07-20 ENCOUNTER — Telehealth (INDEPENDENT_AMBULATORY_CARE_PROVIDER_SITE_OTHER): Payer: Self-pay | Admitting: *Deleted

## 2010-07-21 ENCOUNTER — Telehealth (INDEPENDENT_AMBULATORY_CARE_PROVIDER_SITE_OTHER): Payer: Self-pay | Admitting: *Deleted

## 2010-07-29 ENCOUNTER — Encounter (INDEPENDENT_AMBULATORY_CARE_PROVIDER_SITE_OTHER): Payer: Self-pay | Admitting: *Deleted

## 2010-08-04 ENCOUNTER — Telehealth (INDEPENDENT_AMBULATORY_CARE_PROVIDER_SITE_OTHER): Payer: Self-pay | Admitting: *Deleted

## 2010-08-09 ENCOUNTER — Ambulatory Visit: Payer: Self-pay | Admitting: Internal Medicine

## 2010-08-09 LAB — CONVERTED CEMR LAB: INR: 2.5

## 2010-08-22 ENCOUNTER — Telehealth (INDEPENDENT_AMBULATORY_CARE_PROVIDER_SITE_OTHER): Payer: Self-pay | Admitting: *Deleted

## 2010-09-01 ENCOUNTER — Ambulatory Visit: Payer: Self-pay | Admitting: Internal Medicine

## 2010-09-05 ENCOUNTER — Telehealth: Payer: Self-pay | Admitting: Internal Medicine

## 2010-09-06 ENCOUNTER — Encounter: Payer: Self-pay | Admitting: Internal Medicine

## 2010-09-06 ENCOUNTER — Ambulatory Visit: Payer: Self-pay | Admitting: Internal Medicine

## 2010-09-07 LAB — CONVERTED CEMR LAB
ALT: 25 units/L (ref 0–53)
AST: 21 units/L (ref 0–37)
BUN: 39 mg/dL — ABNORMAL HIGH (ref 6–23)
CO2: 26 meq/L (ref 19–32)
Calcium: 9.6 mg/dL (ref 8.4–10.5)
Glucose, Bld: 158 mg/dL — ABNORMAL HIGH (ref 70–99)
HDL: 37.9 mg/dL — ABNORMAL LOW (ref >39.00)
Sodium: 142 meq/L (ref 135–145)
Total CHOL/HDL Ratio: 3
VLDL: 34.8 mg/dL (ref 0.0–40.0)

## 2010-09-18 ENCOUNTER — Emergency Department (HOSPITAL_BASED_OUTPATIENT_CLINIC_OR_DEPARTMENT_OTHER)
Admission: EM | Admit: 2010-09-18 | Discharge: 2010-09-18 | Payer: Self-pay | Source: Home / Self Care | Admitting: Emergency Medicine

## 2010-09-28 ENCOUNTER — Ambulatory Visit
Admission: RE | Admit: 2010-09-28 | Discharge: 2010-09-28 | Payer: Self-pay | Source: Home / Self Care | Attending: Internal Medicine | Admitting: Internal Medicine

## 2010-09-28 DIAGNOSIS — S0100XA Unspecified open wound of scalp, initial encounter: Secondary | ICD-10-CM | POA: Insufficient documentation

## 2010-10-04 ENCOUNTER — Ambulatory Visit
Admission: RE | Admit: 2010-10-04 | Discharge: 2010-10-04 | Payer: Self-pay | Source: Home / Self Care | Attending: Internal Medicine | Admitting: Internal Medicine

## 2010-10-06 ENCOUNTER — Encounter: Payer: Self-pay | Admitting: Internal Medicine

## 2010-10-06 ENCOUNTER — Ambulatory Visit
Admission: RE | Admit: 2010-10-06 | Discharge: 2010-10-06 | Payer: Self-pay | Source: Home / Self Care | Attending: Internal Medicine | Admitting: Internal Medicine

## 2010-10-06 ENCOUNTER — Telehealth (INDEPENDENT_AMBULATORY_CARE_PROVIDER_SITE_OTHER): Payer: Self-pay | Admitting: *Deleted

## 2010-10-23 LAB — CONVERTED CEMR LAB
Alkaline Phosphatase: 38 units/L — ABNORMAL LOW (ref 39–117)
BUN: 40 mg/dL — ABNORMAL HIGH (ref 6–23)
Bilirubin, Direct: 0.1 mg/dL (ref 0.0–0.3)
CO2: 23 meq/L (ref 19–32)
CO2: 25 meq/L (ref 19–32)
Calcium: 9.5 mg/dL (ref 8.4–10.5)
Chloride: 107 meq/L (ref 96–112)
Chloride: 109 meq/L (ref 96–112)
Creatinine, Ser: 1.8 mg/dL — ABNORMAL HIGH (ref 0.4–1.5)
Glucose, Bld: 111 mg/dL — ABNORMAL HIGH (ref 70–99)
LDL Cholesterol: 34 mg/dL (ref 0–99)
Pro B Natriuretic peptide (BNP): 62 pg/mL (ref 0.0–100.0)
Sodium: 138 meq/L (ref 135–145)
Sodium: 140 meq/L (ref 135–145)
Total Bilirubin: 1 mg/dL (ref 0.3–1.2)
VLDL: 22.8 mg/dL (ref 0.0–40.0)

## 2010-10-25 NOTE — Cardiovascular Report (Signed)
Summary: Office Visit Remote   Office Visit Remote   Imported By: Roderic Ovens 10/06/2009 15:01:43  _____________________________________________________________________  External Attachment:    Type:   Image     Comment:   External Document

## 2010-10-25 NOTE — Progress Notes (Signed)
Summary: rx  Phone Note Refill Request Message from:  Pharmacy  ketoconazole 2% topical cream  Initial call taken by: Kandice Hams,  October 13, 2009 11:50 AM  Follow-up for Phone Call        rx was removed  by Dr Milas Kocher and pt was referred to dermatology  Follow-up by: Kandice Hams,  October 13, 2009 12:02 PM

## 2010-10-25 NOTE — Assessment & Plan Note (Signed)
Summary: rov ///kp   Copy to:  Dr. Sherryl Manges Primary German Manke/Referring Joseph Hamilton:  Nolon Rod. Paz MD  CC:  Pt here for follow up. Pt states has been gone x 1 month and setting has been changed on last Thursday. Pt c/o mask "flapping" and not able to keep same on all night. Pt has new mask and no relief.  History of Present Illness: 70 yo male with OSA and insomnia.  He has been tried on CPAP.  He has tried several masks.  He is able to fall asleep with the machine.  He wakes up after a few hours, and is unable to go back to sleep unless he takes his mask off.  He feels there is too much pressure, and the mask makes too much noise.  As a result he has not been using his CPAP, and does not wish to use this any further.    Preventive Screening-Counseling & Management  Alcohol-Tobacco     Alcohol drinks/day: 1     Smoking Status: quit     Year Started: 1957     Year Quit: 1994     Pipe use/week: 15-20 pipes daily  Current Medications (verified): 1)  [name] 2 Mg Tabs (Warfarin Sodium) .... As Directed 2)  Furosemide 40 Mg Tabs (Furosemide) .... Take One Tablet By Mouth Daily. 3)  Ambien Cr 12.5 Mg Tbcr (Zolpidem Tartrate) .... Take 1 Tablet By Mouth Every Night For Sleep 4)  Amitriptyline Hcl 25 Mg Tabs (Amitriptyline Hcl) .... Take 1 Tablet By Mouth At Bedtime 5)  Crestor 40 Mg Tabs (Rosuvastatin Calcium) .Marland Kitchen.. 1 Tablet By Mouth Once A Day 6)  Altace 10 Mg  Caps (Ramipril) .Marland Kitchen.. 1 By Mouth Daily 7)  Aspirin 81 Mg Tbec (Aspirin) .... Take One Tablet By Mouth Daily 8)  Carvedilol 12.5 Mg Tabs (Carvedilol) .... Take One Tablet By Mouth Twice A Day 9)  Niaspan 1000 Mg Cr-Tabs (Niacin (Antihyperlipidemic)) .... Take One Tablet By Mouth Once Daily. 10)  Niaspan 500 Mg Cr-Tabs (Niacin (Antihyperlipidemic)) .... Take One Tablet By Mouth Once Daily. 11)  Spironolactone 25 Mg Tabs (Spironolactone) .... Take 1/2 Tab By Mouth Once Daily 12)  Omeprazole 20 Mg Cpdr (Omeprazole) .Marland Kitchen.. 1 By Mouth Two  Times A Day 13)  Cpap .Marland Kitchen.. 13  Allergies (verified): 1)  * Vytorin  Past History:  Past Medical History: AODM  1. Coronary artery disease.      a.     Status post myocardial infarction x2.      b.     Redo coronary artery bypass grafting 2003.      c.     Myvoiew 12/09 EF 28%: extesnive anterior, inferior and lateral infarct. very mild anterior ischemia.  2. Congestive heart failure secondary to ischemic cardiomyopathy.      a.     Ejection fraction 28% by Myoview, 50% by echocardiogram in November 2009. Echo 1/11 EF "mild to moderately reduced"      b.     Status post Guidant implantable cardioverter defibrillator 2006  3. History of pulmonary emboli in February 2005, ICU x 1 week, maintained on   Coumadin per recommendation     of his cardiologist in [place]. Mom has a h/o "clots"  4. Chronic dizziness--- s/p ENT eval, s/p eval at [facility]       Headache: s/p extensive eval; on elavil   5. Hypertension.  6. Hyperlipidemia.  7. Sleep apnea>>Home sleep test AHI 15.6 from 03/31/10, unable to tolerate  CPAP  8. Obesity.  9. Dupuytren contractures.  10.Osteoarthritis. 11. Nephrolithiasis 12. Diverticuli on colonoscopy 11-2004    --repeat due 2016 13. CRI - baseline Cr 1.7---Dr Briant Cedar 14. 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 Pt states has been gone x 1 month and setting has been changed on last Thursday. Pt c/o mask "flapping" and not able to keep same on all night. Pt has new mask and no relief Comments Medications reviewed with patient Verified contact number and pharmacy  with patient Zackery Barefoot CMA  July 11, 2010 1:33 PM    Physical Exam  General:  obese.   Nose:  deviated septum.   Mouth:  MP 3, no exudate Neck:  no JVD.   Lungs:  clear bilaterally to auscultation and percussion Heart:  normal rate, regular rhythm, and no murmur.   Extremities:  no pretibial edema bilaterally  Neurologic:  normal CN II-XII and strength normal.   Cervical Nodes:  no significant adenopathy   Impression & Recommendations:  Problem # 1:  OBSTRUCTIVE SLEEP APNEA (ICD-327.23) He has not been able to tolerate CPAP and is not willing to try CPAP any further.  I explained to him that there are options to get him used to using CPAP, but he does not wish to pursue these options at this time.  He is aware of how untreated sleep apnea can affect his health, and he is willing to accept these risks.    He is going to check with his dentist about getting a mandibular advancement device to treat his sleep apnea.  Advised him to call if he is getting this, and if he needs referral to dentist that can get him fitted for device.  Advised him that he will need f/u sleep test with device in place.  Problem # 2:  INSOMNIA (ICD-780.52) He is to continue on [place].  Medications Added to Medication List This Visit: 1)  [name] 2 Mg Tabs (Warfarin sodium) .... As directed 2)  Cpap  .Marland Kitchen.. 13  Complete Medication List: 1)  [name] 2 Mg Tabs (Warfarin sodium) .... As directed 2)  Furosemide 40 Mg Tabs (Furosemide) .... Take one tablet by mouth daily. 3)  Ambien Cr 12.5 Mg Tbcr (Zolpidem tartrate) .... Take 1 tablet by mouth every night for sleep 4)  Amitriptyline Hcl 25 Mg Tabs (Amitriptyline hcl) .... Take 1 tablet by mouth at bedtime 5)  Crestor 40 Mg Tabs (Rosuvastatin calcium) .Marland Kitchen.. 1 tablet by mouth once a day 6)  Altace 10 Mg Caps (Ramipril) .Marland Kitchen.. 1 by mouth daily 7)  Aspirin 81 Mg Tbec (Aspirin) .... Take one tablet by mouth daily 8)  Carvedilol 12.5 Mg Tabs (Carvedilol)  .... Take one tablet by mouth twice a day 9)  Niaspan 1000 Mg Cr-tabs (Niacin (antihyperlipidemic)) .... Take one tablet by mouth once daily. 10)  Niaspan 500 Mg Cr-tabs (Niacin (antihyperlipidemic)) .... Take one  tablet by mouth once daily. 11)  Spironolactone 25 Mg Tabs (Spironolactone) .... Take 1/2 tab by mouth once daily 12)  Omeprazole 20 Mg Cpdr (Omeprazole) .Marland Kitchen.. 1 by mouth two times a day 13)  Cpap  .Marland Kitchen.. 13  Other Orders: DME Referral (DME) Est. Patient Level III ([address])  Patient Instructions: 1)  check with your dentist about getting a mouth piece (oral appliance) for treating your sleep apnea 2)  Follow up in 4 to 6 months    Not Administered:    Influenza Vaccine not given due to: declined

## 2010-10-25 NOTE — Assessment & Plan Note (Signed)
Summary: cpx/ns/kdc   Vital Signs:  Patient profile:   70 year old male Height:      70 inches Weight:      217 pounds BMI:     31.25 Pulse rate:   85 / minute BP sitting:   116 / 61  Vitals Entered By: Shary Decamp (November 08, 2009 1:50 PM) CC: yearly - not fasting   History of Present Illness: yearly, chart reviewed  R testcle bigger x years, concerned no pain  AODM-- not on diet, saw a nutritionist last year "i don't eat well period" not interestes in improve his diet   Hypertension-- ambulatory BPs 120s/70s  Hyperlipidemia-- good medication compliance   CRI -sees Dr Briant Cedar yearly      Preventive Screening-Counseling & Management  Alcohol-Tobacco     Alcohol drinks/day: 1      Drug Use:  no.    Allergies: 1)  * Vytorin  Past History:  Past Medical History: AODM  1. Coronary artery disease.      a.     Status post myocardial infarction x2.      b.     Redo coronary artery bypass grafting 2003.      c.     Myvoiew 12/09 EF 28%: extesnive anterior, inferior and lateral infarct. very mild anterior ischemia.  2. Congestive heart failure secondary to ischemic cardiomyopathy.      a.     Ejection fraction 28% by Myoview, 50% by echocardiogram in       November 2009.      b.     Status post Guidant implantable cardioverter defibrillator 2006  3. History of pulmonary emboli in February 2005, ICU x 1 week, maintained on   Coumadin per recommendation     of his cardiologist in [place]. Mom has a h/o "clots"  4. Chronic dizziness--- s/p ENT eval, s/p eval at [facility]       Headache: s/p extensive eval; on elavil   5. Hypertension.  6. Hyperlipidemia.  7. Sleep apnea, refusing treatment.  8. Obesity.  9. Dupuytren contractures.  10.Osteoarthritis. 11. Nephrolithiasis 12. Diverticuli on colonoscopy 11-2004    --repeat due 2016 13. CRI - baseline Cr 1.7---Dr Briant Cedar 14. 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 colon ca--no prostate ca--no DM--no stroke--no MI-- +++++  Social History: Reviewed history from 11/16/2008 and no changes required. Divorced lives by himself  3 children (one [place]) Tobacco Use - quit 1995 (pipe) ETOH-- 1 a day exercise-- no diet poor  retired, works PT Drug Use:  no  Review of Systems General:  Denies fatigue and fever. CV:  Denies palpitations and swelling of feet. Resp:  Denies cough and shortness of breath. GI:  Denies bloody stools, diarrhea, nausea, and vomiting. GU:  Denies urinary frequency and urinary hesitancy.  Physical Exam  General:  alert and well-developed.  + central obesity Lungs:  Normal respiratory effort, chest expands symmetrically. Lungs are clear to auscultation, no crackles or wheezes. Heart:  normal rate, regular rhythm, and no murmur.   Abdomen:  soft, non-tender, no distention, and no masses.   Rectal:  ext hemorrhoids noted. Normal sphincter tone. No rectal masses or tenderness. Genitalia:  circumcised.  testicles are approximately the same size right testicle large  distal  epididymis left testicle has a large Proximal, slightly hard epididymis Prostate:  Prostate gland firm and smooth, no enlargement, nodularity, tenderness, mass, asymmetry or induration. Extremities:  no pretibial edema bilaterally    Impression & Recommendations:  Problem # 1:  ? of EPIDIDYMAL CYST (ICD-608.89) Assessment New see physical exam, likely epididymal cysts, check a ultrasound to confirm  Orders: Radiology Referral (Radiology)  Problem # 2:  ROUTINE GENERAL MEDICAL EXAM@HEALTH  CARE FACL (ICD-V70.0) explained benefits of shots:    Tetanus/Td # 1:  refused  (11/08/2009)    Influenza # 1:  refused   (11/08/2009)    Pneumovax # 1:  refused  (11/08/2009)  check a PSA , see instructions  Diverticuli on colonoscopy 11-2004    --repeat due 2016  Problem # 3:  DIABETES MELLITUS, TYPE II (ICD-250.00) treatment and options are limited by renal failure and CHF he does not like to diet better recommend exercise  and recheck at A1c in 3 weeks ?amaryl ?Januvia printed material provided regards diet exercise  His updated medication list for this problem includes:    Altace 10 Mg Caps (Ramipril) .Marland Kitchen... 1 by mouth once daily    Aspirin 81 Mg Tbec (Aspirin) .Marland Kitchen... Take one tablet by mouth daily  Labs Reviewed: Creat: 1.7 (10/22/2009)    Reviewed HgBA1c results: 7.2 (09/21/2009)  7.3 (12/26/2007)  Problem # 4:  SYSTOLIC HEART FAILURE, CHRONIC (ICD-428.22) per cards  His updated medication list for this problem includes:    [name] 2 Mg Tabs (Warfarin sodium) .Marland Kitchen... As directed    Lasix 20 Mg Tabs (Furosemide) .Marland Kitchen... Take 1 tablet by mouth once a day - 2 once daily prn    Altace 10 Mg Caps (Ramipril) .Marland Kitchen... 1 by mouth once daily    Aspirin 81 Mg Tbec (Aspirin) .Marland Kitchen... Take one tablet by mouth daily    Coreg 25 Mg Tabs (Carvedilol) .Marland Kitchen... 1 by mouth bid    Spironolactone 25 Mg Tabs (Spironolactone) .Marland Kitchen... 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 1 tablet by mouth once a day    Niaspan 1000 Mg Cr-tabs (Niacin (antihyperlipidemic)) .Marland Kitchen... Take one tablet by mouth once daily.    Niaspan 500 Mg Cr-tabs (Niacin (antihyperlipidemic)) .Marland Kitchen... Take one tablet by mouth once daily.  Labs Reviewed: SGOT: 22 (02/18/2009)   SGPT: 24 (02/18/2009)  Prior 10 Yr Risk Heart Disease: N/A (12/26/2007)   HDL:38.20 (02/18/2009), 36.5 (08/28/2008)  LDL:34 (02/18/2009), 61 (08/28/2008)  Chol:95 (02/18/2009), 131 (08/28/2008)  Trig:114.0 (02/18/2009), 167  (08/28/2008)  Problem # 6:  RENAL INSUFFICIENCY (ICD-588.9) sees nephrology from time to time, last potassium slightly elevated, to be re- checked by cardiology  Problem # 7:  ENCOUNTER FOR THERAPEUTIC DRUG MONITORING (ICD-V58.83) on Coumadin for life, see HPI. next INR at the end of the month    Problem # 8:  time spent > 25 min due to chart review and > 50% of the time counseling   Complete Medication List: 1)  [name] 2 Mg Tabs (Warfarin sodium) .... As directed 2)  Lasix 20 Mg Tabs (Furosemide) .... Take 1 tablet by mouth once a day - 2 once daily prn 3)  Ambien Cr 12.5 Mg Tbcr (Zolpidem tartrate) .... Take 1 tablet by mouth every night 4)  Amitriptyline Hcl 25 Mg Tabs (Amitriptyline hcl) .... Take 1 tablet by mouth at bedtime 5)  Crestor  40 Mg Tabs (Rosuvastatin calcium) .Marland Kitchen.. 1 tablet by mouth once a day 6)  Altace 10 Mg Caps (Ramipril) .Marland Kitchen.. 1 by mouth once daily 7)  Aspirin 81 Mg Tbec (Aspirin) .... Take one tablet by mouth daily 8)  Coreg 25 Mg Tabs (Carvedilol) .Marland Kitchen.. 1 by mouth bid 9)  Niaspan 1000 Mg Cr-tabs (Niacin (antihyperlipidemic)) .... Take one tablet by mouth once daily. 10)  Niaspan 500 Mg Cr-tabs (Niacin (antihyperlipidemic)) .... Take one tablet by mouth once daily. 11)  Spironolactone 25 Mg Tabs (Spironolactone) .... Take 1/2 tab by mouth once daily  Patient Instructions: 1)  come back in two weeks for your Coumadin check 2)  also will do live following labs 3)  PSA ---dx screening for prostate cancer 4)  CBC and TSH---Dx CHF 5)  A1C --- dx DM 6)  Please schedule a follow-up appointment in 3 months .    Risk Factors:  Drug use:  no    Drinks per day:  1    Tetanus/Td Immunization History:    Tetanus/Td # 1:  refused  (11/08/2009)  Influenza Immunization History:    Influenza # 1:  refused  (11/08/2009)  Pneumovax Immunization History:    Pneumovax # 1:  refused  (11/08/2009)

## 2010-10-25 NOTE — Procedures (Signed)
Summary: Respiration Report   Respiration Report   Imported By: Roderic Ovens 04/20/2010 14:36:47  _____________________________________________________________________  External Attachment:    Type:   Image     Comment:   External Document  Appended Document: Respiration Report  Appt Dr Craige Cotta 8-12 to review

## 2010-10-25 NOTE — Consult Note (Signed)
Summary: [facility]   Imported By: Lanelle Bal 11/17/2009 10:23:20  _____________________________________________________________________  External Attachment:    Type:   Image     Comment:   External Document

## 2010-10-25 NOTE — Progress Notes (Signed)
Summary: Beverley Fiedler  Phone Note Refill Request Message from:  Pharmacy on [facility] [phone]  Refills Requested: Medication #1:  AMBIEN CR 12.5 MG TBCR Take 1 tablet by mouth every night Initial call taken by: Kandice Hams,  October 11, 2009 2:38 PM  Follow-up for Phone Call        last refill #30 x 1 on 08/13/09, last ov 03/18/09 Follow-up by: Kandice Hams,  October 11, 2009 4:34 PM  Additional Follow-up for Phone Call Additional follow up Details #1::        ok 30 and 6 RF Additional Follow-up by: Jose E. Paz MD,  October 12, 2009 8:21 AM    Prescriptions: AMBIEN CR 12.5 MG TBCR (ZOLPIDEM TARTRATE) Take 1 tablet by mouth every night  #30 x 6   Entered by:   Kandice Hams   Authorized by:   Nolon Rod. Paz MD   Signed by:   Kandice Hams on 10/12/2009   Method used:   Printed then faxed to ...       [facility]* (retail)       [address]       [place], [place]  [address]       Ph: [phone]       Fax: [phone]   RxID:   [id number]

## 2010-10-25 NOTE — Medication Information (Signed)
Summary: Letter Regarding [facility]  Letter Regarding [facility]   Imported By: Lanelle Bal 04/04/2010 12:40:17  _____________________________________________________________________  External Attachment:    Type:   Image     Comment:   External Document

## 2010-10-25 NOTE — Assessment & Plan Note (Signed)
Summary: PT check/drb   Nurse Visit   Vital Signs:  Patient profile:   70 year old male Weight:      221.50 pounds Pulse rate:   82 / minute Pulse rhythm:   regular BP sitting:   122 / 70  (left arm) Cuff size:   regular  Vitals Entered By: Army Fossa CMA (June 07, 2010 3:39 PM) CC: Pt/INR check   Allergies: 1)  * Vytorin Laboratory Results   Blood Tests      INR: 2.0   (Normal Range: 0.88-1.12   Therap INR: 2.0-3.5) Comments: takes 2 mg tabs  1 1/2 tab daily for 2 days this weekend only took 1/2 a tab had a sore in his mouth that was bleeding. Will be on vacation for 1 month, will come in when he returns.  ----------------------------- no change, recommend good compliance. Recheck in 4 weeks Jose E. Paz MD  June 08, 2010 2:08 PM   Pt is aware. Army Fossa CMA  June 08, 2010 2:08 PM     Orders Added: 1)  Est. Patient Level I [99211] 2)  Protime [[id number]]

## 2010-10-25 NOTE — Assessment & Plan Note (Signed)
Summary: coumidin check///sph   Nurse Visit   Vital Signs:  Patient profile:   70 year old male Height:      70 inches (177.80 cm) Weight:      220.38 pounds (100.17 kg) BMI:     31.74 Temp:     98.5 degrees F (36.94 degrees C) oral BP sitting:   104 / 50  (left arm) Cuff size:   regular  Vitals Entered By: Lucious Groves CMA (August 09, 2010 2:43 PM) CC: PT check/kb   Allergies: 1)  * Vytorin Laboratory Results   Blood Tests   Date/Time Received: Lucious Groves [facility]  August 09, 2010 2:44 PM  Date/Time Reported: Lucious Groves CMA  August 09, 2010 2:44 PM    INR: 2.5   (Normal Range: 0.88-1.12   Therap INR: 2.0-3.5) Comments: Patient notes that he has 2mg  tabs at home and takes 1.5 tabs once daily (no alternating) Patient was advised to continue the same and re-check in 4 weeks. He is aware I will call him at home if MD has other recommendations. Lucious Groves CMA  August 09, 2010 2:46 PM  ----------------- no change, recheck in 4 weeks Jose E. Paz MD  August 09, 2010 3:53 PM     Orders Added: 1)  Est. Patient Level I [99211] 2)  Protime [[id number]]

## 2010-10-25 NOTE — Letter (Signed)
Summary: Remote Device Check  [facility], Main Office  [address]   [place], [place] [address]   Phone: [phone]  Fax: [phone]     December 27, 2009 MRN: [id number]   Joseph Hamilton [address] [place], [place]  [address]   Dear Mr. TOMPSON,   Your remote transmission was recieved and reviewed by your physician.  All diagnostics were within normal limits for you.    ___X___Your next office visit is scheduled for:    JUNE 2011 WITH DR Graciela Husbands. Please call our office to schedule an appointment.    Sincerely,  Proofreader

## 2010-10-25 NOTE — Progress Notes (Signed)
Summary: fyi  Phone Note Call from Patient   Summary of Call: Patient wants to know why omeprazole was changed to zantac 12/10.  Explained that it was changed due to the rash.  Zantac is a H2 blocker & could help with the rash.  Patient would like to restart omeprazole... pt is being treated for the rash by derm. Shary Decamp  November 26, 2009 1:07 PM   Follow-up for Phone Call        ok [place] E. Kerissa Coia MD  November 26, 2009 1:43 PM     New/Updated Medications: OMEPRAZOLE 20 MG CPDR (OMEPRAZOLE) 1 by mouth two times a day

## 2010-10-25 NOTE — Letter (Signed)
Summary: Remote Device Check  [facility], Main Office  [address]   [place], [place] [address]   Phone: [phone]  Fax: [phone]     July 29, 2010 MRN: [id number]   Joseph Hamilton [address] [place], [place]  [address]   Dear [name],   Your remote transmission was recieved and reviewed by your physician.  All diagnostics were within normal limits for you.  __X___Your next transmission is scheduled for: 10/06/2010.   Please transmit at any time this day.  If you have a wireless device your transmission will be sent automatically.  ______Your next office visit is scheduled for:                              . Please call our office to schedule an appointment.    Sincerely,  Altha Harm, LPN

## 2010-10-25 NOTE — Letter (Signed)
Summary: [facility]   Imported By: Lester Bellingham 06/09/2010 11:09:26  _____________________________________________________________________  External Attachment:    Type:   Image     Comment:   External Document

## 2010-10-25 NOTE — Cardiovascular Report (Signed)
Summary: Office Visit Remote   Office Visit Remote   Imported By: Roderic Ovens 01/04/2010 16:39:14  _____________________________________________________________________  External Attachment:    Type:   Image     Comment:   External Document

## 2010-10-25 NOTE — Progress Notes (Signed)
Summary: Omeprazole refill  Phone Note Refill Request Message from:  Fax from Pharmacy on August 04, 2010 2:40 PM  Refills Requested: Medication #1:  OMEPRAZOLE 20 MG CPDR 1 by mouth two times a day   Last Refilled: 06/10/2010 [facility],  [address], [place], [place]   fax  [phone]  Initial call taken by: Jerolyn Shin,  August 04, 2010 2:41 PM    Prescriptions: OMEPRAZOLE 20 MG CPDR (OMEPRAZOLE) 1 by mouth two times a day  #60 x 1   Entered by:   Army Fossa CMA   Authorized by:   Nolon Rod. Paz MD   Signed by:   Army Fossa CMA on 08/04/2010   Method used:   Electronically to        [facility]* (retail)       [address]       [place], [place]  [address]       Ph: [phone]       Fax: [phone]   RxID:   [id number]

## 2010-10-25 NOTE — Letter (Signed)
Summary: Cancer Screening/[facility] Personalized Risk Profile  Cancer Screening/[facility] Risk Profile   Imported By: Lanelle Bal 11/11/2009 12:14:12  _____________________________________________________________________  External Attachment:    Type:   Image     Comment:   External Document

## 2010-10-25 NOTE — Assessment & Plan Note (Signed)
Summary: defib check.guidant.amber  Medications Added AMBIEN CR 12.5 MG TBCR (ZOLPIDEM TARTRATE) Take 1 tablet by mouth every night for sleep ALTACE 10 MG  CAPS (RAMIPRIL) Take 1 capsule by mouth at bedtime CARVEDILOL 12.5 MG TABS (CARVEDILOL) Take one tablet by mouth twice a day      Allergies Added:   Primary Provider:  Nolon Rod. Paz MD   History of Present Illness: Mr. Mcclane is seen in followup for ischemic heart disease with a previously implanted ICD for primary prevention.  He has a history of coronary artery disease, status post previous myocardial infarction, as well as redo coronary bypass grafting in 2003.  He also has congestive heart failure related to an ischemic cardiomyopathy with an EF of 28% by Myoview 12/09 and 50% by echocardiogram.  He is status post ICD.   his major complaint is fatigue. He has sleep disordered breathing and he sleeps with the hands trying to take care of his presumed sleep apnea.  He also has a history of lightheadedness occurring only while standing.  A variety of his physicians recently have commented on the low blood pressure.  Current Medications (verified): 1)  [name] 2 Mg Tabs (Warfarin Sodium) .... As Directed 2)  Lasix 20 Mg Tabs (Furosemide) .... Take 1 Tablet By Mouth Once A Day - 2 Once Daily Prn 3)  Ambien Cr 12.5 Mg Tbcr (Zolpidem Tartrate) .... Take 1 Tablet By Mouth Every Night For Sleep 4)  Amitriptyline Hcl 25 Mg Tabs (Amitriptyline Hcl) .... Take 1 Tablet By Mouth At Bedtime 5)  Crestor 40 Mg Tabs (Rosuvastatin Calcium) .Marland Kitchen.. 1 Tablet By Mouth Once A Day 6)  Altace 10 Mg  Caps (Ramipril) .Marland Kitchen.. 1 By Mouth Once Daily 7)  Aspirin 81 Mg Tbec (Aspirin) .... Take One Tablet By Mouth Daily 8)  Coreg 25 Mg  Tabs (Carvedilol) .Marland Kitchen.. 1 By Mouth Bid 9)  Niaspan 1000 Mg Cr-Tabs (Niacin (Antihyperlipidemic)) .... Take One Tablet By Mouth Once Daily. 10)  Niaspan 500 Mg Cr-Tabs (Niacin (Antihyperlipidemic)) .... Take One Tablet By Mouth Once  Daily. 11)  Spironolactone 25 Mg Tabs (Spironolactone) .... Take 1/2 Tab By Mouth Once Daily 12)  Omeprazole 20 Mg Cpdr (Omeprazole) .Marland Kitchen.. 1 By Mouth Two Times A Day  Allergies (verified): 1)  * Vytorin  Past History:  Past Medical History: Last updated: 11/08/2009 AODM  1. Coronary artery disease.      a.     Status post myocardial infarction x2.      b.     Redo coronary artery bypass grafting 2003.      c.     Myvoiew 12/09 EF 28%: extesnive anterior, inferior and lateral infarct. very mild anterior ischemia.  2. Congestive heart failure secondary to ischemic cardiomyopathy.      a.     Ejection fraction 28% by Myoview, 50% by echocardiogram in       November 2009.      b.     Status post Guidant implantable cardioverter defibrillator 2006  3. History of pulmonary emboli in February 2005, ICU x 1 week, maintained on   Coumadin per recommendation     of his cardiologist in [place]. Mom has a h/o "clots"  4. Chronic dizziness--- s/p ENT eval, s/p eval at [facility]       Headache: s/p extensive eval; on elavil   5. Hypertension.  6. Hyperlipidemia.  7. Sleep apnea, refusing treatment.  8. Obesity.  9. Dupuytren contractures.  10.Osteoarthritis. 11. Nephrolithiasis 12.  Diverticuli on colonoscopy 11-2004    --repeat due 2016 13. CRI - baseline Cr 1.7---Dr Briant Cedar 14. 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  Nephrolithiasis, hx of  Past Surgical History: Last updated: 11/08/2009 Coronary artery bypass graft (1994) Coronary artery bypass graft (2003) Cataract extraction tonsilectomy as a child   Family History: Last updated: 11/08/2009 colon ca--no prostate ca--no DM--no stroke--no MI-- +++++  Social History: Last updated: 11/08/2009 Divorced lives by himself  3 children (one [place]) Tobacco Use - quit 1995 (pipe) ETOH-- 1 a day exercise-- no diet poor  retired, works PT  Vital Signs:  Patient profile:   70 year old male Height:       70 inches Weight:      220 pounds BMI:     31.68 Pulse rhythm:   regular BP sitting:   118 / 62  (left arm) Cuff size:   large  Vitals Entered By: Judithe Modest CMA (March 22, 2010 9:04 AM)  Physical Exam  General:  The patient was alert and oriented in no acute distress. HEENT Normal.  Neck veins were flat, carotids were brisk.  Lungs were clear.  Heart sounds were regular without murmurs or gallops.  Abdomen was soft with active bowel sounds. There is no clubbing cyanosis or edema. Skin Warm and dry neurological exam is grossly normal    ICD Specifications Following MD:  Sherryl Manges, MD     Referring MD:  [facility] ICD Vendor:  [facility] [employer]     ICD Model Number:  T127     ICD Serial Number:  [id number] ICD DOI:  03/09/2004     ICD Implanting MD:  Sherryl Manges, MD  Lead 1:    Location: RA     DOI: 03/09/2004     Model #: [id number]     Serial #: [id number]     Status: active Lead 2:    Location: RV     DOI: 03/09/2004     Model #: [id number]     Serial #: [id number]     Status: active  Indications::  ICM   ICD Follow Up Battery Voltage:  2.60 V     Charge Time:  13.1 seconds     Underlying rhythm:  SR ICD Dependent:  No       ICD Device Measurements Atrium:  Amplitude: 6.4 mV, Impedance: 489 ohms, Threshold: 0.4 V at 0.4 msec Right Ventricle:  Amplitude: >25 mV, Impedance: 746 ohms, Threshold: 1.0 V at 0.4 msec Shock Impedance: 46 ohms   Episodes MS Episodes:  0     Coumadin:  Yes Shock:  0     ATP:  0     Nonsustained:  0     Atrial Therapies:  0 Atrial Pacing:  2%     Ventricular Pacing:  1%  Brady Parameters Mode DDD     Lower Rate Limit:  50     Upper Rate Limit 120 PAV 260     Sensed AV Delay:  300  Tachy Zones VF:  185     Next Remote Date:  06/23/2010     Next Cardiology Appt Due:  02/24/2011 Tech Comments:  NORMAL DEVICE FUNCTION.  CHANGED AV DELAY TO 260-356ms.  LATITUDE CHECK 06-23-10. ROV 1 YR W/SK.  Vella Kohler  March 22, 2010 9:11 AM  Impression &  Recommendations:  Problem # 1:  FATIGUE (ICD-780.79) The patient had significant fatigue and snoring. I wonder to what degree he had sleep apnea.  He has not been able to undertake sleep lab evaluation. We will arrange for him to have a Night Watch study for home assessment of sleep apnea  Problem # 2:  SNORING (ICD-786.09) aabove His updated medication list for this problem includes:    Lasix 20 Mg Tabs (Furosemide) .Marland Kitchen... Take 1 tablet by mouth once a day - 2 once daily prn    Altace 10 Mg Caps (Ramipril) .Marland Kitchen... Take 1 capsule by mouth at bedtime    Aspirin 81 Mg Tbec (Aspirin) .Marland Kitchen... Take one tablet by mouth daily    Carvedilol 12.5 Mg Tabs (Carvedilol) .Marland Kitchen... Take one tablet by mouth twice a day    Spironolactone 25 Mg Tabs (Spironolactone) .Marland Kitchen... Take 1/2 tab by mouth once daily  Orders: Misc. Referral (Misc. Ref)  Problem # 3:  SYSTOLIC HEART FAILURE, CHRONIC (ICD-428.22) heart failure remained stable.  ECG from January demonstrated a not broad QRS. This is not a candidate for CRT.  Given his history of lightheadedness and hypotension, as well as fatigue, I'm going to undertake a two-week experiment to decrease his carvedilol from 25-12.5 twice a day he is to let us know how it is he feels His updated medication list for this problem includes:    [name] 2 Mg Tabs (Warfarin sodium) .Marland Kitchen... As directed    Lasix 20 Mg Tabs (Furosemide) .Marland Kitchen... Take 1 tablet by mouth once a day - 2 once daily prn    Altace 10 Mg Caps (Ramipril) .Marland Kitchen... Take 1 capsule by mouth at bedtime    Aspirin 81 Mg Tbec (Aspirin) .Marland Kitchen... Take one tablet by mouth daily    Carvedilol 12.5 Mg Tabs (Carvedilol) .Marland Kitchen... Take one tablet by mouth twice a day    Spironolactone 25 Mg Tabs (Spironolactone) .Marland Kitchen... Take 1/2 tab by mouth once daily  Problem # 4:  DIZZINESS (ICD-780.4) see above  Problem # 5:  IMPLANTABLE  DEFIBRILLATOR, DDD GDT (ICD-V45.02) Device parameters and data were reviewed and no changes were made  Problem # 6:   CAD, AUTOLOGOUS BYPASS GRAFT (ICD-414.02) stable His updated medication list for this problem includes:    [name] 2 Mg Tabs (Warfarin sodium) .Marland Kitchen... As directed    Altace 10 Mg Caps (Ramipril) .Marland Kitchen... Take 1 capsule by mouth at bedtime    Aspirin 81 Mg Tbec (Aspirin) .Marland Kitchen... Take one tablet by mouth daily    Carvedilol 12.5 Mg Tabs (Carvedilol) .Marland Kitchen... Take one tablet by mouth twice a day  Patient Instructions: 1)  Your physician has recommended you make the following change in your medication: Decrease Carvedilol to 12.5mg  1 tablet twice daily for 2 weeks then call and let us know how you are doing. 2)  Your physician has recommended that you have a sleep study.  This test records several body functions during sleep, including:  brain activity, eye movement, oxygen and carbon dioxide blood levels, heart rate and rhythm, breathing rate and rhythm, the flow of air through your mouth and nose, snoring, body muscle movements, and chest and belly movement. 3)  Your physician wants you to follow-up in: 12 months with Dr Graciela Husbands. You will receive a reminder letter in the mail two months in advance. If you don't receive a letter, please call our office to schedule the follow-up appointment. Prescriptions: CARVEDILOL 12.5 MG TABS (CARVEDILOL) Take one tablet by mouth twice a day  #60 x 11   Entered by:   Proofreader   Authorized by:   Nathen May, MD, [facility]  Signed by:   Gypsy Balsam RN BSN on 03/22/2010   Method used:   Electronically to        [facility]* (retail)       [address]       [place], [place]  [address]       Ph: [phone]       Fax: [phone]   RxID:   [id number]

## 2010-10-25 NOTE — Assessment & Plan Note (Signed)
Summary: pt//kn   Nurse Visit   Vital Signs:  Patient profile:   70 year old male Weight:      221.13 pounds Pulse rate:   90 / minute Pulse rhythm:   regular BP sitting:   126 / 82  (left arm) Cuff size:   large  Vitals Entered By: Army Fossa CMA (April 04, 2010 3:46 PM)  Allergies: 1)  * Vytorin Laboratory Results   Blood Tests     PT: 2.5 s   (Normal Range: 10.6-13.4)  Comments: Pt has 2.5 mg tabs. Takes 1 1/2 once daily. Army Fossa CMA  April 04, 2010 3:47 PM notify patient: same coumadin, 4 weeks Chanon Loney E. Megan Hayduk MD  April 06, 2010 12:00 PM   left detailed message for pt. Army Fossa CMA  April 06, 2010 12:01 PM     Orders Added: 1)  Est. Patient Level I [99211] 2)  Protime [16606TK]

## 2010-10-25 NOTE — Assessment & Plan Note (Signed)
Summary: 6 month rov  Medications Added FUROSEMIDE 40 MG TABS (FUROSEMIDE) Take one tablet by mouth daily.      Allergies Added:   Referring Provider:  Dr. Sherryl Manges Primary Provider:  Nolon Rod. Paz MD  CC:  6 month rov.  Pt states his legs and chest hurt.  .  History of Present Illness: [name] is a very pleasant 70 year old male with a history of coronary artery disease, status post previous myocardial infarction, as well as redo coronary bypass grafting in 2003.  He also has congestive heart failure related to an ischemic cardiomyopathy with an EF of 28% by Myoview 12/09 and 50% by echocardiogram.  Echo in 1/11 with EF read as "mild to moderately depressed"  He is status post ICD.  The remainder of his medical history is notable for a history of pulmonary emboli in 2005, CRI baseline 1.7-1.9 hypertension, hyperlipidemia, and chronic dizziness/headaches, which has been recently evaluated by Dr. Nigel Berthold at [facility].   He returns today for routine followup.    Saw Dr. Graciela Husbands several months ago and referrred for repeat sleep study. Failed sleep study with mild-mod sleep apnea. Saw Dr. Craige Cotta in f/u and started on CPAP. last week. having trouble tolerating the mask.  Also Coreg cut down to 12.5 two times a day due to fatigue. Did not make a difference. Now back on 25 two times a day and feels the same. Breathing feels ok but he does fell that he is getting a little more DOE. Took an extra dose of lasix today and has had a brisk diuresis.  Chest scar seems to be irritrating him more than previously. No angina.  Feels that he is getting depressed because he is bored and not doing anything.     Current Medications (verified): 1)  [name] 2 Mg Tabs (Warfarin Sodium) .... As Directed 2)  Lasix 20 Mg Tabs (Furosemide) .... Take 1 Tablet By Mouth Once A Day - 2 Once Daily Prn 3)  Ambien Cr 12.5 Mg Tbcr (Zolpidem Tartrate) .... Take 1 Tablet By Mouth Every Night For Sleep 4)  Amitriptyline Hcl 25  Mg Tabs (Amitriptyline Hcl) .... Take 1 Tablet By Mouth At Bedtime 5)  Crestor 40 Mg Tabs (Rosuvastatin Calcium) .Marland Kitchen.. 1 Tablet By Mouth Once A Day 6)  Altace 10 Mg  Caps (Ramipril) .Marland Kitchen.. 1 By Mouth Daily 7)  Aspirin 81 Mg Tbec (Aspirin) .... Take One Tablet By Mouth Daily 8)  Carvedilol 12.5 Mg Tabs (Carvedilol) .... Take One Tablet By Mouth Twice A Day 9)  Niaspan 1000 Mg Cr-Tabs (Niacin (Antihyperlipidemic)) .... Take One Tablet By Mouth Once Daily. 10)  Niaspan 500 Mg Cr-Tabs (Niacin (Antihyperlipidemic)) .... Take One Tablet By Mouth Once Daily. 11)  Spironolactone 25 Mg Tabs (Spironolactone) .... Take 1/2 Tab By Mouth Once Daily 12)  Omeprazole 20 Mg Cpdr (Omeprazole) .Marland Kitchen.. 1 By Mouth Two Times A Day  Allergies (verified): 1)  * Vytorin  Past History:  Past Medical History: AODM  1. Coronary artery disease.      a.     Status post myocardial infarction x2.      b.     Redo coronary artery bypass grafting 2003.      c.     Myvoiew 12/09 EF 28%: extesnive anterior, inferior and lateral infarct. very mild anterior ischemia.  2. Congestive heart failure secondary to ischemic cardiomyopathy.      a.     Ejection fraction 28% by Myoview, 50% by echocardiogram in  November 2009. Echo 1/11 EF "mild to moderately reduced"      b.     Status post Guidant implantable cardioverter defibrillator 2006  3. History of pulmonary emboli in February 2005, ICU x 1 week, maintained on   Coumadin per recommendation     of his cardiologist in [place]. Mom has a h/o "clots"  4. Chronic dizziness--- s/p ENT eval, s/p eval at [facility]       Headache: s/p extensive eval; on elavil   5. Hypertension.  6. Hyperlipidemia.  7. Sleep apnea>>Home sleep test AHI 15.6 from 03/31/10  8. Obesity.  9. Dupuytren contractures.  10.Osteoarthritis. 11. Nephrolithiasis 12. Diverticuli on colonoscopy 11-2004    --repeat due 2016 13. CRI - baseline Cr 1.7---Dr Briant Cedar 14. Carotid Stenosis  --0-39% bilaterally with chronically  occluded L vertebral  Review of Systems       As per HPI and past medical history; otherwise all systems negative.   Vital Signs:  Patient profile:   70 year old male Height:      70 inches Weight:      222 pounds BMI:     31.97 Pulse rate:   70 / minute Pulse rhythm:   regular BP sitting:   124 / 76  (left arm) Cuff size:   regular  Vitals Entered By: Judithe Modest CMA (May 25, 2010 1:39 PM)  Physical Exam  General:  Well appearing. no resp difficulty HEENT: normal Neck: supple. JVP 8. Carotids 2+ bilat; no bruits. Chest wall: +keloid. no instabilitiy Cor: PMI laterally displace. Bradycardic and regular. No rubs, gallops, murmur. Lungs: clear Abdomen: obese. soft, nontender, nondistended. No hepatosplenomegaly. No bruits or masses. Good bowel sounds. Extremities: no cyanosis, clubbing, rash. 2-3+ edema Neuro: alert & orientedx3, cranial nerves grossly intact. moves all 4 extremities w/o difficulty. affect pleasant    ICD Specifications Following MD:  Sherryl Manges, MD     Referring MD:  [facility] ICD Vendor:  [facility] [employer]     ICD Model Number:  T127     ICD Serial Number:  [id number] ICD DOI:  03/09/2004     ICD Implanting MD:  Sherryl Manges, MD  Lead 1:    Location: RA     DOI: 03/09/2004     Model #: [id number]     Serial #: [id number]     Status: active Lead 2:    Location: RV     DOI: 03/09/2004     Model #: [id number]     Serial #: [id number]     Status: active  Indications::  ICM   ICD Follow Up ICD Dependent:  No      Episodes Coumadin:  Yes  Brady Parameters Mode DDD     Lower Rate Limit:  50     Upper Rate Limit 120 PAV 260     Sensed AV Delay:  300  Tachy Zones VF:  185     Impression & Recommendations:  Problem # 1:  CAD, AUTOLOGOUS BYPASS GRAFT (ICD-414.02) Stable. No evidence of ischemia. Continue current regimen.  Problem # 2:  SYSTOLIC HEART FAILURE, CHRONIC (ICD-428.22) NYHA II-III. Signficant volume overload on exam. Given CRI will increase lasix to 40  once daily. Check BMET and BNP today and in 1 week.   Problem # 3:  EMBOLISM/INFARCTION, PULMONARY NEC (ICD-415.19) He is now 6 years out from previous PE. He is not aware of any known hypercoag disorder. We had long discussion about pros/cons of discontinuing coumadin. Will continue for  now.  Problem # 4:  OBSTRUCTIVE SLEEP APNEA (ICD-327.23) We discussed need for compliance with mask and continued attempts at weight loss.   Other Orders: EKG w/ Interpretation (93000) TLB-BMP (Basic Metabolic Panel-BMET) (80048-METABOL) TLB-BNP (B-Natriuretic Peptide) (83880-BNPR)  Patient Instructions: 1)  Increase lasix to 40mg  once daily. 2)  Your physician recommends that you have lab work today: bmet/bnp (414.01;428.22) and in 1 week: bmet/bnp (414.01;428.22). 3)  Your physician recommends that you schedule a follow-up appointment in: 3 months. Prescriptions: FUROSEMIDE 40 MG TABS (FUROSEMIDE) Take one tablet by mouth daily.  #30 x 6   Entered by:   Sherri Rad, RN, BSN   Authorized by:   Dolores Patty, MD, [facility]   Signed by:   Sherri Rad, RN, BSN on 05/25/2010   Method used:   Electronically to        [facility]* (retail)       [address]       [place], [place]  [address]       Ph: [phone]       Fax: [phone]   RxID:   [id number]

## 2010-10-25 NOTE — Progress Notes (Signed)
Summary: resend -refill  Phone Note Refill Request Message from:  Fax from Pharmacy on July 21, 2010 8:14 AM  Refills Requested: Medication #1:  [name] 2 MG TABS as directed refill was sent to [facility] wrong provider please resend - fax [phone]  Initial call taken by: Okey Regal Spring,  July 21, 2010 8:18 AM    Prescriptions: [name] 2 MG TABS (WARFARIN SODIUM) as directed  #30 x 1   Entered by:   Army Fossa CMA   Authorized by:   Nolon Rod. Paz MD   Signed by:   Army Fossa CMA on 07/21/2010   Method used:   Electronically to        [facility]* (retail)       [address]       [place], [place]  [address]       Ph: [phone]       Fax: [phone]   RxID:   [id number]

## 2010-10-25 NOTE — Progress Notes (Signed)
Summary: refill  Phone Note Refill Request Message from:  Fax from Pharmacy on August 22, 2010 8:49 AM  Refills Requested: Medication #1:  CRESTOR 40 MG TABS 1 tablet by mouth once a day [facility] - fax [phone] - tel [phone]  Initial call taken by: Okey Regal Spring,  August 22, 2010 8:50 AM    New/Updated Medications: CRESTOR 40 MG TABS (ROSUVASTATIN CALCIUM) 1 tablet by mouth once a day. DUE FOR AN OFFICE VISIT BEFORE ADDITIONAL REFILLS. Prescriptions: CRESTOR 40 MG TABS (ROSUVASTATIN CALCIUM) 1 tablet by mouth once a day. DUE FOR AN OFFICE VISIT BEFORE ADDITIONAL REFILLS.  #30 x 0   Entered by:   Army Fossa CMA   Authorized by:   Nolon Rod. Paz MD   Signed by:   Army Fossa CMA on 08/22/2010   Method used:   Electronically to        [facility]* (retail)       [address]       [place], [place]  [address]       Ph: [phone]       Fax: [phone]   RxID:   [id number]

## 2010-10-25 NOTE — Progress Notes (Signed)
Summary: Refill Request  Phone Note Refill Request Message from:  Patient on July 20, 2010 3:55 PM  Refills Requested: Medication #1:  [name] 2 MG TABS as directed   Supply Requested: 1 month   Last Refilled: 06/07/2010  Method Requested: Telephone to Pharmacy Initial call taken by: Barnie Mort,  July 20, 2010 3:56 PM    Prescriptions: [name] 2 MG TABS (WARFARIN SODIUM) as directed  #30 x 1   Entered and Authorized by:   Barnie Mort   Signed by:   Barnie Mort on 07/20/2010   Method used:   Telephoned to ...       [facility]* (retail)       [address]       [place], [place]  [address]       Ph: [phone]       Fax: [phone]   RxID:   [id number]

## 2010-10-25 NOTE — Assessment & Plan Note (Signed)
Summary: abn. sleep study/apc   Visit Type:  Initial Consult Copy to:  Dr. Sherryl Manges Primary Provider/Referring Provider:  Nolon Rod. Paz MD  CC:  Sleep consult.  Epworth score is 4..  History of Present Illness: 70 yo male for evaluation of sleep apnea.  He has noticed troube with his sleep since his first heart attack in 1994.  This happened during the night, and he has a fear that it could happen like this again.  As a result he does have trouble falling asleep.  He does also snore, and has several family members with sleep apnea.  He thinks his snoring may be related to his sinus problems, and he had sinus surgery 45 years ago with some improvement.  He still has trouble with sinus congestion, and seasonal allergies.  He has a history of cardiovascular disease, and during recent evaluation concerned was raised whether he may have sleep apnea.  As a result he had a home sleep test.  This showed moderate sleep apnea.  As a result sleep consultation was requested.  He goes to bed at 11pm after taking ambien at 10 pm.  He has been using [place] for years.  He will some times have a glass of wine before sleeping also.  With this combination he can fall asleep quickly.  He wakes up once to use the bathroom, but otherwise sleeps through the night.  He will occasionally wake up hearing himself snore.  He is not aware of sleep walking, or sleep talking.  He lives alone.  His dentist has told him he grinds his teeth.  He has trouble sleeping on his back.  He gets out of bed at 8am, and feels okay.  He denies morning headache.  He will occasional take a nap in the afternoon.  He denies restless legs.  There is no history of sleep hallucinations, sleep paralysis, or cataplexy.  His weight has been steady.  There is no history of thyroid disease.  He feels his mood is okay.  He has a history of reflux, and thinks that some of his nocturnal awakenings are related to reflux.  He has been using anti-reflux  therapy, and he does not wake up as much as before.      Preventive Screening-Counseling & Management  Alcohol-Tobacco     Smoking Status: quit     Year Started: 1957     Year Quit: 1994     Pipe use/week: 15-20 pipes daily  Current Medications (verified): 1)  [name] 2 Mg Tabs (Warfarin Sodium) .... As Directed 2)  Lasix 20 Mg Tabs (Furosemide) .... Take 1 Tablet By Mouth Once A Day - 2 Once Daily Prn 3)  Ambien Cr 12.5 Mg Tbcr (Zolpidem Tartrate) .... Take 1 Tablet By Mouth Every Night For Sleep 4)  Amitriptyline Hcl 25 Mg Tabs (Amitriptyline Hcl) .... Take 1 Tablet By Mouth At Bedtime 5)  Crestor 40 Mg Tabs (Rosuvastatin Calcium) .Marland Kitchen.. 1 Tablet By Mouth Once A Day 6)  Altace 10 Mg  Caps (Ramipril) .Marland Kitchen.. 1 By Mouth Daily 7)  Aspirin 81 Mg Tbec (Aspirin) .... Take One Tablet By Mouth Daily 8)  Carvedilol 12.5 Mg Tabs (Carvedilol) .... Take One Tablet By Mouth Twice A Day 9)  Niaspan 1000 Mg Cr-Tabs (Niacin (Antihyperlipidemic)) .... Take One Tablet By Mouth Once Daily. 10)  Niaspan 500 Mg Cr-Tabs (Niacin (Antihyperlipidemic)) .... Take One Tablet By Mouth Once Daily. 11)  Spironolactone 25 Mg Tabs (Spironolactone) .... Take 1/2 Tab By  Mouth Once Daily 12)  Omeprazole 20 Mg Cpdr (Omeprazole) .Marland Kitchen.. 1 By Mouth Two Times A Day  Allergies (verified): 1)  * Vytorin  Past History:  Past Medical History: AODM  1. Coronary artery disease.      a.     Status post myocardial infarction x2.      b.     Redo coronary artery bypass grafting 2003.      c.     Myvoiew 12/09 EF 28%: extesnive anterior, inferior and lateral infarct. very mild anterior ischemia.  2. Congestive heart failure secondary to ischemic cardiomyopathy.      a.     Ejection fraction 28% by Myoview, 50% by echocardiogram in November 2009.      b.     Status post Guidant implantable cardioverter defibrillator 2006  3. History of pulmonary emboli in February 2005, ICU x 1 week, maintained on   Coumadin per recommendation      of his cardiologist in [place]. Mom has a h/o "clots"  4. Chronic dizziness--- s/p ENT eval, s/p eval at [facility]       Headache: s/p extensive eval; on elavil   5. Hypertension.  6. Hyperlipidemia.  7. Sleep apnea>>Home sleep test AHI 15.6 from 03/31/10  8. Obesity.  9. Dupuytren contractures.  10.Osteoarthritis. 11. Nephrolithiasis 12. Diverticuli on colonoscopy 11-2004    --repeat due 2016 13. CRI - baseline Cr 1.7---Dr Briant Cedar 14. 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 Coronary artery bypass graft (1994) Coronary artery bypass graft (2003) Cataract extraction tonsilectomy as a child   Family History: colon ca--no prostate ca--no DM--no stroke--no MI-- +++++ Family History MI/Heart Attack---father Embolism----mother  Social History: Smoking Status:  quit Pipe use/week:  15-20 pipes daily  Review of Systems       The patient complains of shortness of breath with activity, nasal congestion/difficulty breathing through nose, and joint stiffness or pain.  The patient denies shortness of breath at rest, productive cough, non-productive cough, coughing up blood, chest pain, irregular heartbeats, acid heartburn, indigestion, loss of appetite, weight change, abdominal pain, difficulty swallowing, sore throat, tooth/dental problems, headaches, sneezing, itching, ear ache, anxiety, depression, hand/feet swelling, rash, change in color of mucus, and fever.    Vital Signs:  Patient profile:   70 year old male Height:      70 inches (177.80 cm) Weight:      224.50 pounds (102.05 kg) BMI:     32.33 O2 Sat:      97 % on Room air Temp:     97.5 degrees F (36.39 degrees C) oral Pulse rate:   73 / minute BP sitting:   122 / 62  (left arm) Cuff size:   regular  Vitals Entered By: Michel Bickers CMA (May 06, 2010 3:48 PM)  O2 Sat at Rest %:  97 O2 Flow:  Room  air CC: Sleep consult.  Epworth score is 4. Comments Medications reviewed. Daytime phone verified. Michel Bickers [facility]  May 06, 2010 4:02 PM   Physical Exam  General:  obese.   Eyes:  PERRLA and EOMI.   Nose:  deviated septum.   Mouth:  MP 3, no exudate Neck:  no JVD.   Chest Wall:  no deformities noted Lungs:  clear bilaterally to auscultation and percussion Heart:  normal rate, regular rhythm, and no murmur.   Abdomen:  soft, non-tender, no distention, and no masses.  Pulses:  normal pedal pulses bilaterally  Extremities:  no pretibial edema bilaterally  Neurologic:  normal CN II-XII and strength normal.   Cervical Nodes:  no significant adenopathy Psych:  alert and cooperative; normal mood and affect; normal attention span and concentration   Impression & Recommendations:  Problem # 1:  OBSTRUCTIVE SLEEP APNEA (ICD-327.23) He has mild to moderate sleep apnea.  He has a history of cardiovascular disease.  I reviewed his sleep test results with him.  I explained how sleep apnea can affect his health.  Driving precautions, and need for weight loss were discussed.  Treatment options were reviewed.  Will proceed with setting up autoCPAP.  I advised him that if this set up is unsuccessful, then he would need to have an in-lab titration study.  Problem # 2:  INSOMNIA (ICD-780.52) He has sleep onset insomnia and has been using [place] for years.  This is likely multifactorial.  He may have some degree of post-traumatic stress related to his original heart attack in 1994.  I have advised him to discuss this further with his primary care physician.  Some of his sleep onset insomnia could also be related to sleep apnea.  Will re-assess after he is established on therapy for sleep apnea.    Medications Added to Medication List This Visit: 1)  Altace 10 Mg Caps (Ramipril) .Marland Kitchen.. 1 by mouth daily  Complete Medication List: 1)  [name] 2 Mg Tabs (Warfarin sodium) .... As directed 2)   Lasix 20 Mg Tabs (Furosemide) .... Take 1 tablet by mouth once a day - 2 once daily prn 3)  Ambien Cr 12.5 Mg Tbcr (Zolpidem tartrate) .... Take 1 tablet by mouth every night for sleep 4)  Amitriptyline Hcl 25 Mg Tabs (Amitriptyline hcl) .... Take 1 tablet by mouth at bedtime 5)  Crestor 40 Mg Tabs (Rosuvastatin calcium) .Marland Kitchen.. 1 tablet by mouth once a day 6)  Altace 10 Mg Caps (Ramipril) .Marland Kitchen.. 1 by mouth daily 7)  Aspirin 81 Mg Tbec (Aspirin) .... Take one tablet by mouth daily 8)  Carvedilol 12.5 Mg Tabs (Carvedilol) .... Take one tablet by mouth twice a day 9)  Niaspan 1000 Mg Cr-tabs (Niacin (antihyperlipidemic)) .... Take one tablet by mouth once daily. 10)  Niaspan 500 Mg Cr-tabs (Niacin (antihyperlipidemic)) .... Take one tablet by mouth once daily. 11)  Spironolactone 25 Mg Tabs (Spironolactone) .... Take 1/2 tab by mouth once daily 12)  Omeprazole 20 Mg Cpdr (Omeprazole) .Marland Kitchen.. 1 by mouth two times a day  Other Orders: Consultation Level IV (91478) DME Referral (DME)  Patient Instructions: 1)  Will set up CPAP machine at home 2)  Follow up in 6 to 8 weeks

## 2010-10-25 NOTE — Progress Notes (Signed)
Summary: Phone  Phone Note Outgoing Call Call back at [phone] [phone]   Call placed by: Barb Merino,  November 05, 2009 8:49 AM Details for Reason: dx code Summary of Call: Zane Herald is requesting a dx code on date of service 11/01/09. She stated that medicare rejected v58.69 on pt. Please advise Initial call taken by: Barb Merino,  November 05, 2009 8:50 AM  Follow-up for Phone Call        d/w [facility] E. Paz MD  November 05, 2009 3:25 PM   can try...v13.91, 415.19 Shary Decamp  November 05, 2009 3:28 PM     Additional Follow-up for Phone Call Additional follow up Details #2::    called osirisr back took the dx code 415.19 Follow-up by: Barb Merino,  November 08, 2009 11:57 AM

## 2010-10-25 NOTE — Cardiovascular Report (Signed)
Summary: Office Visit   Office Visit   Imported By: Roderic Ovens 03/23/2010 12:27:24  _____________________________________________________________________  External Attachment:    Type:   Image     Comment:   External Document

## 2010-10-25 NOTE — Progress Notes (Signed)
Summary: rx need today  Phone Note Call from Patient Call back at Home Phone [phone]   Caller: Patient Summary of Call: Pt walk in needing a refill on his zolpidem tartrate 12.5. [facility] [phone] Initial call taken by: Freddy Jaksch,  May 09, 2010 3:06 PM  Follow-up for Phone Call        ok 30 and 6 [facility] Halsey Hammen E. Chikita Dogan MD  May 09, 2010 4:26 PM     Prescriptions: AMBIEN CR 12.5 MG TBCR (ZOLPIDEM TARTRATE) Take 1 tablet by mouth every night for sleep  #30 x 6   Entered by:   Army Fossa CMA   Authorized by:   Nolon Rod. Venie Montesinos MD   Signed by:   Army Fossa CMA on 05/09/2010   Method used:   Printed then faxed to ...       [facility]* (retail)       [address]       [place], [place]  [address]       Ph: [phone]       Fax: [phone]   RxID:   [id number]

## 2010-10-25 NOTE — Miscellaneous (Signed)
Summary: Orders Update  Clinical Lists Changes  Problems: Added new problem of CHEST PAIN UNSPECIFIED (ICD-786.50) Orders: Added new Test order of T-2 View CXR (71020TC) - Signed 

## 2010-10-25 NOTE — Miscellaneous (Signed)
Summary: Orders / [employer]  Orders / [employer]   Imported By: Lennie Odor 06/06/2010 09:24:39  _____________________________________________________________________  External Attachment:    Type:   Image     Comment:   External Document

## 2010-10-25 NOTE — Cardiovascular Report (Signed)
Summary: Office Visit Remote   Office Visit Remote   Imported By: Roderic Ovens 08/02/2010 15:28:00  _____________________________________________________________________  External Attachment:    Type:   Image     Comment:   External Document

## 2010-10-25 NOTE — Progress Notes (Signed)
Summary: sleep study results   Phone Note Outgoing Call Call back at [facility] Phone [phone]   Call placed by: Gypsy Balsam RN BSN,  April 12, 2010 6:16 PM Summary of Call: Called patient and left message on machine to call about sleep study results-- shows mild to moderate obstructive sleep apnea.  Per Dr Graciela Husbands, should be referred to Dr Vassie Loll or Dr Craige Cotta in pulmonary for evaluation.  Also need to see how patient is feeling after adjusting Coreg dose. Gypsy Balsam RN BSN  April 12, 2010 6:17 PM

## 2010-10-25 NOTE — Assessment & Plan Note (Signed)
Summary: 3 mth fu/kdc   Vital Signs:  Patient profile:   70 year old male Height:      70 inches Weight:      225.2 pounds BMI:     32.43 Pulse rate:   60 / minute BP sitting:   110 / 64  Vitals Entered By: Shary Decamp (Feb 07, 2010 3:07 PM) CC: rov   History of Present Illness: routine office visit for diabetes Since the last time I saw the patient, his diet has not changed He is not exercising more or more frequent He does not take ambulatory CBGs   Current Medications (verified): 1)  [name] 2 Mg Tabs (Warfarin Sodium) .... As Directed 2)  Lasix 20 Mg Tabs (Furosemide) .... Take 1 Tablet By Mouth Once A Day - 2 Once Daily Prn 3)  Ambien Cr 12.5 Mg Tbcr (Zolpidem Tartrate) .... Take 1 Tablet By Mouth Every Night 4)  Amitriptyline Hcl 25 Mg Tabs (Amitriptyline Hcl) .... Take 1 Tablet By Mouth At Bedtime 5)  Crestor 40 Mg Tabs (Rosuvastatin Calcium) .Marland Kitchen.. 1 Tablet By Mouth Once A Day 6)  Altace 10 Mg  Caps (Ramipril) .Marland Kitchen.. 1 By Mouth Once Daily 7)  Aspirin 81 Mg Tbec (Aspirin) .... Take One Tablet By Mouth Daily 8)  Coreg 25 Mg  Tabs (Carvedilol) .Marland Kitchen.. 1 By Mouth Bid 9)  Niaspan 1000 Mg Cr-Tabs (Niacin (Antihyperlipidemic)) .... Take One Tablet By Mouth Once Daily. 10)  Niaspan 500 Mg Cr-Tabs (Niacin (Antihyperlipidemic)) .... Take One Tablet By Mouth Once Daily. 11)  Spironolactone 25 Mg Tabs (Spironolactone) .... Take 1/2 Tab By Mouth Once Daily 12)  Omeprazole 20 Mg Cpdr (Omeprazole) .Marland Kitchen.. 1 By Mouth Two Times A Day  Allergies (verified): 1)  * Vytorin  Past History:  Past Medical History: Reviewed history from 11/08/2009 and no changes required. AODM  1. Coronary artery disease.      a.     Status post myocardial infarction x2.      b.     Redo coronary artery bypass grafting 2003.      c.     Myvoiew 12/09 EF 28%: extesnive anterior, inferior and lateral infarct. very mild anterior ischemia.  2. Congestive heart failure secondary to ischemic cardiomyopathy.  a.     Ejection fraction 28% by Myoview, 50% by echocardiogram in       November 2009.      b.     Status post Guidant implantable cardioverter defibrillator 2006  3. History of pulmonary emboli in February 2005, ICU x 1 week, maintained on   Coumadin per recommendation     of his cardiologist in [place]. Mom has a h/o "clots"  4. Chronic dizziness--- s/p ENT eval, s/p eval at [facility]       Headache: s/p extensive eval; on elavil   5. Hypertension.  6. Hyperlipidemia.  7. Sleep apnea, refusing treatment.  8. Obesity.  9. Dupuytren contractures.  10.Osteoarthritis. 11. Nephrolithiasis 12. Diverticuli on colonoscopy 11-2004    --repeat due 2016 13. CRI - baseline Cr 1.7---Dr Briant Cedar 14. 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 Coronary artery bypass graft (1994) Coronary artery bypass graft (2003) Cataract extraction tonsilectomy as a child   Social History: Reviewed history from 11/08/2009 and no changes required. Divorced lives by himself  3 children (one [place]) Tobacco Use - quit 1995 (pipe) ETOH-- 1 a day  exercise-- no diet poor  retired, works PT  Review of Systems       denies chest pain or shortness of breath No lower extremity edema Ambulatory BPs in the 110/70 range occ.  he has numbness in the feet, this is going  on for years and not getting worse. At some point he was told he has Dupuytren's as the cause of the numbness   Physical Exam  General:  alert and well-developed.   Lungs:  Normal respiratory effort, chest expands symmetrically. Lungs are clear to auscultation, no crackles or wheezes. Heart:  normal rate, regular rhythm, and no murmur.   Pulses:  normal pedal pulses bilaterally  Extremities:  no pretibial edema bilaterally   Diabetes Management Exam:    Foot Exam (with socks and/or shoes not present):        Sensory-Pinprick/Light touch:          Left medial foot (L-4): normal          Left dorsal foot (L-5): normal          Left lateral foot (S-1): normal          Right medial foot (L-4): normal          Right dorsal foot (L-5): normal          Right lateral foot (S-1): normal       Sensory-Monofilament:          Left foot: normal          Right foot: normal       Inspection:          Left foot: normal          Right foot: normal       Nails:          Left foot: normal          Right foot: normal   Impression & Recommendations:  Problem # 1:  DIABETES MELLITUS, TYPE II (ICD-250.00) treatment and options are limited by renal failure and CHF labs  feet  care discussed His updated medication list for this problem includes:    Altace 10 Mg Caps (Ramipril) .Marland Kitchen... 1 by mouth once daily    Aspirin 81 Mg Tbec (Aspirin) .Marland Kitchen... Take one tablet by mouth daily  Labs Reviewed: Creat: 1.9 (11/11/2009)    Reviewed HgBA1c results: 6.9 (11/26/2009)  7.2 (09/21/2009)  Orders: TLB-A1C / Hgb A1C (Glycohemoglobin) (83036-A1C) TLB-Microalbumin/Creat Ratio, Urine (82043-MALB)  Problem # 2:  HYPERLIPIDEMIA-MIXED (ICD-272.4) at goal  His updated medication list for this problem includes:    Crestor 40 Mg Tabs (Rosuvastatin calcium) .Marland Kitchen... 1 tablet by mouth once a day    Niaspan 1000 Mg Cr-tabs (Niacin (antihyperlipidemic)) .Marland Kitchen... Take one tablet by mouth once daily.    Niaspan 500 Mg Cr-tabs (Niacin (antihyperlipidemic)) .Marland Kitchen... Take one tablet by mouth once daily.  Orders: Venipuncture (30865) TLB-ALT (SGPT) (84460-ALT) TLB-AST (SGOT) (84450-SGOT)  Labs Reviewed: SGOT: 22 (02/18/2009)   SGPT: 24 (02/18/2009)  Prior 10 Yr Risk Heart Disease: N/A (12/26/2007)   HDL:38.20 (02/18/2009), 36.5 (08/28/2008)  LDL:34 (02/18/2009), 61 (08/28/2008)  Chol:95 (02/18/2009), 131 (08/28/2008)  Trig:114.0 (02/18/2009), 167 (08/28/2008)  Complete Medication List: 1)  [name] 2 Mg Tabs (Warfarin sodium) .... As  directed 2)  Lasix 20 Mg Tabs (Furosemide) .... Take 1 tablet by mouth once a day - 2 once daily prn 3)  Ambien Cr 12.5 Mg Tbcr (Zolpidem tartrate) .... Take 1 tablet by mouth every night 4)  Amitriptyline Hcl 25  Mg Tabs (Amitriptyline hcl) .... Take 1 tablet by mouth at bedtime 5)  Crestor 40 Mg Tabs (Rosuvastatin calcium) .Marland Kitchen.. 1 tablet by mouth once a day 6)  Altace 10 Mg Caps (Ramipril) .Marland Kitchen.. 1 by mouth once daily 7)  Aspirin 81 Mg Tbec (Aspirin) .... Take one tablet by mouth daily 8)  Coreg 25 Mg Tabs (Carvedilol) .Marland Kitchen.. 1 by mouth bid 9)  Niaspan 1000 Mg Cr-tabs (Niacin (antihyperlipidemic)) .... Take one tablet by mouth once daily. 10)  Niaspan 500 Mg Cr-tabs (Niacin (antihyperlipidemic)) .... Take one tablet by mouth once daily. 11)  Spironolactone 25 Mg Tabs (Spironolactone) .... Take 1/2 tab by mouth once daily 12)  Omeprazole 20 Mg Cpdr (Omeprazole) .Marland Kitchen.. 1 by mouth two times a day  Patient Instructions: 1)  Please schedule a follow-up appointment in 3 months .

## 2010-10-25 NOTE — Assessment & Plan Note (Signed)
Summary: pt--ph   Nurse Visit   Vital Signs:  Patient profile:   70 year old male Height:      70 inches Weight:      220 pounds O2 Sat:      97 % on Room air Pulse rate:   60 / minute BP sitting:   120 / 62  (left arm)  Vitals Entered By: Jeremy Johann CMA (March 07, 2010 4:09 PM)  O2 Flow:  Room air CC: pt check Comments REVIEWED MED LIST, PATIENT AGREED DOSE AND INSTRUCTION CORRECT    Allergies: 1)  * Vytorin Laboratory Results   Blood Tests    Date/Time Reported: March 07, 2010 4:10 PM   INR: 2.6   (Normal Range: 0.88-1.12   Therap INR: 2.0-3.5) Comments: current dose. 2mg  1 1/2 tab once daily ..................Marland KitchenFelecia Deloach CMA  March 07, 2010 4:10 PM  per dr Drue Novel continue current dose rto in 4 weeks.Felecia Deloach CMA  March 07, 2010 4:53 PM  [place] E. Paz MD  March 09, 2010 1:31 PM     Orders Added: 1)  Protime [11914NW] 2)  Est. Patient Level I [29562]

## 2010-10-25 NOTE — Assessment & Plan Note (Signed)
Summary: pt will walk in today for pt/inr/swh   Nurse Visit   Vital Signs:  Patient profile:   70 year old male Height:      70 inches Weight:      225 pounds BP sitting:   110 / 60  Vitals Entered By: Shary Decamp (October 04, 2009 9:58 AM)   Allergies: 1)  * Vytorin Laboratory Results   Blood Tests     PT: 19.8 s   (Normal Range: 10.6-13.4)  INR: 2.7   (Normal Range: 0.88-1.12   Therap INR: 2.0-3.5) Comments: CURRENT DOSE: 2 MG TABLETS - 1 by mouth once daily except 1 1/2  on tues, thurs, sat NO CHANGE Recheck 2 weeks Shary Decamp  October 04, 2009 9:59 AM     Orders Added: 1)  Est. Patient Level I [[address]] 2)  Protime [[id number]]

## 2010-10-25 NOTE — Letter (Signed)
Summary: Device-Delinquent Phone Journalist, newspaper, Main Office  [address]   [place], [place] [address]   Phone: [phone]  Fax: [phone]     June 30, 2010 MRN: [id number]   NIKODEM LEADBETTER [address] [place], [place]  [address]   Dear Mr. KRETSCHMER,  According to our records, you were scheduled for a device phone transmission on 06-23-10.     We did not receive any results from this check.  If you transmitted on your scheduled day, please call us to help troubleshoot your system.  If you forgot to send your transmission, please send one upon receipt of this letter.  Thank you,   Architectural technologist [facility]

## 2010-10-25 NOTE — Assessment & Plan Note (Signed)
Summary: PT CHECK/KN   Nurse Visit   Vital Signs:  Patient profile:   70 year old male Weight:      221 pounds Pulse rate:   73 / minute Pulse rhythm:   regular BP sitting:   122 / 74  (left arm) Cuff size:   regular  Vitals Entered By: Army Fossa CMA (May 23, 2010 3:36 PM) CC: PT/INR   Allergies: 1)  * Vytorin Laboratory Results   Blood Tests      INR: 3.3   (Normal Range: 0.88-1.12   Therap INR: 2.0-3.5) Comments: 2mg  Tabs Takes 1 1/2 tabs daily.  ----------------------------------------------------------- INR only slightly high compared to previous ones Advise patient: Continue with the same Coumadin Recheck INR in 3 weeks instead of 4 Jose E. Paz MD  May 24, 2010 8:30 AM   left message for pt to call back. Army Fossa CMA  May 24, 2010 9:34 AM  Spoke with pt he is aware, appt made. Army Fossa CMA  May 24, 2010 1:04 PM     Orders Added: 1)  Est. Patient Level I [99211] 2)  Protime [[id number]]

## 2010-10-25 NOTE — Assessment & Plan Note (Signed)
Summary: PT CHECK--PH   Nurse Visit   Vital Signs:  Patient profile:   70 year old male Weight:      220 pounds BP sitting:   122 / 60  Vitals Entered By: Shary Decamp (January 03, 2010 3:17 PM)  Allergies: 1)  * Vytorin Laboratory Results   Blood Tests      INR: 2.2   (Normal Range: 0.88-1.12   Therap INR: 2.0-3.5) Comments: CURRENT DOSE: 2mg  tablets - 1 1/2 by mouth once daily (21mg /wk) no change, recheck in 3 weeks Shary Decamp  January 03, 2010 3:17 PM     Orders Added: 1)  Est. Patient Level I [00938] 2)  Protime [[id number]]
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
## 2010-10-25 NOTE — Assessment & Plan Note (Signed)
Summary: PT CHECK///DR Wrenna Saks////[facility]--MOVED PER [facility]////[facility]   Nurse Visit   Vital Signs:  Patient profile:   70 year old male Height:      70 inches Weight:      220 pounds BP sitting:   90 / 62  Vitals Entered By: Shary Decamp (Jan 31, 2010 3:10 PM)   Allergies: 1)  * Vytorin Laboratory Results   Blood Tests      INR: 2.5   (Normal Range: 0.88-1.12   Therap INR: 2.0-3.5) Comments: CURRENT DOSE: 2mg  tablets - 1 1/2 by mouth once daily (21mg /wk) No change, recheck in 4 weeks Shary Decamp  Jan 31, 2010 3:11 PM     Orders Added: 1)  Est. Patient Level I [16109] 2)  Protime [[id number]]

## 2010-10-25 NOTE — Assessment & Plan Note (Signed)
Summary: pt check/kdc   Nurse Visit   Vital Signs:  Patient profile:   70 year old male Height:      70 inches Weight:      218 pounds BP sitting:   130 / 80  Vitals Entered By: Shary Decamp (October 18, 2009 10:56 AM) CC: PT/INR   Allergies: 1)  * Vytorin Laboratory Results   Blood Tests     PT: 1.8 s   (Normal Range: 10.6-13.4)  INR: 16.5   (Normal Range: 0.88-1.12   Therap INR: 2.0-3.5) Comments: CURRENT DOSE: 2mg  1 tablet daily except 1 1/2 on thurs & sat.  I thought pt was taking 1 1/2 tablet on tues, thurs, & sat.  Advised pt to start the 1 1/2 tablet on tues, thurs, sat & recheck in 2 weeks Shary Decamp  October 18, 2009 11:14 AM     Orders Added: 1)  Est. Patient Level I [41660] 2)  Protime [[id number]]

## 2010-10-25 NOTE — Miscellaneous (Signed)
Summary: autoCPAP download 05/21/10 to 06/06/10   Clinical Lists Changes Used on 14 of 17 nights with average 4hrs 21 min.  Optimal pressure 13 cm H2O with average AHI 5.4.  Attempted to call pt to discuss results.  Will have my nurse call to inform pt that report shows sleep apnea is controlled with CPAP.  Will set his pressure at 13 cm H2O.  Will discuss further at next ROV. Orders: Added new Referral order of DME Referral (DME) - Signed  Appended Document: autoCPAP download 05/21/10 to 06/06/10 LMOMTCB.  Appended Document: autoCPAP download 05/21/10 to 06/06/10 LMOMTCB.  Appended Document: autoCPAP download 05/21/10 to 06/06/10 Spoke with pt and notified of results/recs per VS.  Pt verbalized understanding and states will keep planned rov for 07/11/10 to discuss further.
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
## 2010-10-25 NOTE — Letter (Signed)
Summary: [facility]  [facility]   Imported By: Lanelle Bal 03/10/2010 09:44:26  _____________________________________________________________________  External Attachment:    Type:   Image     Comment:   External Document

## 2010-10-27 NOTE — Assessment & Plan Note (Signed)
Summary: 3 MOBNTH/D.MILLER  Medications Added CRESTOR 40 MG TABS (ROSUVASTATIN CALCIUM) 1 tablet by mouth once a day.      Allergies Added:   Visit Type:  Follow-up Referring Provider:  Dr. Sherryl Manges Primary Provider:  Nolon Rod. Paz MD  CC:  no complaints.  History of Present Illness: [name] is a very pleasant 70 year old male with a history of coronary artery disease, status post previous myocardial infarction, as well as redo coronary bypass grafting in 2003.  He also has congestive heart failure related to an ischemic cardiomyopathy with an EF of 28% by Myoview 12/09 and 50% by echocardiogram.  Echo in 1/11 with EF read as "mild to moderately depressed"  He is status post ICD.  The remainder of his medical history is notable for a history of pulmonary emboli in 2005, CRI baseline 1.7-1.9 hypertension, hyperlipidemia, OSA, and chronic dizziness/headaches, which has been recently evaluated extensively.   At last visit we increased lasix to 40 once daily due to volume overload.   He returns today for routine followup.  Doing fairly well. No CP. Mild chronic dyspnea with walking upstairs or hurrying.  No CP except from scar. BP well controlled. Compliant with all medication. Mild LE edema. No orthopnea or PND.   K 4.4 CR 1.8 (stable) TC 114 HDL 38 TG 171 LDL 41   Current Medications (verified): 1)  [name] 2 Mg Tabs (Warfarin Sodium) .... As Directed 2)  Ambien Cr 12.5 Mg Tbcr (Zolpidem Tartrate) .... Take 1 Tablet By Mouth Every Night For Sleep 3)  Amitriptyline Hcl 25 Mg Tabs (Amitriptyline Hcl) .... Take 1 Tablet By Mouth At Bedtime 4)  Crestor 40 Mg Tabs (Rosuvastatin Calcium) .Marland Kitchen.. 1 Tablet By Mouth Once A Day. 5)  Niaspan 1000 Mg Cr-Tabs (Niacin (Antihyperlipidemic)) .... Take One Tablet By Mouth Once Daily. 6)  Niaspan 500 Mg Cr-Tabs (Niacin (Antihyperlipidemic)) .... Take One Tablet By Mouth Once Daily. 7)  Altace 10 Mg  Caps (Ramipril) .Marland Kitchen.. 1 By Mouth Daily 8)   Spironolactone 25 Mg Tabs (Spironolactone) .... Take 1/2 Tab By Mouth Once Daily 9)  Furosemide 40 Mg Tabs (Furosemide) .... Take One Tablet By Mouth Daily. 10)  Carvedilol 12.5 Mg Tabs (Carvedilol) .... Take One Tablet By Mouth Twice A Day 11)  Aspirin 81 Mg Tbec (Aspirin) .... Take One Tablet By Mouth Daily 12)  Omeprazole 20 Mg Cpdr (Omeprazole) .Marland Kitchen.. 1 By Mouth Two Times A Day  Allergies (verified): 1)  * Vytorin  Review of Systems       As per HPI and past medical history; otherwise all systems negative.   Vital Signs:  Patient profile:   70 year old male Height:      70 inches Weight:      224 pounds BMI:     32.26 Pulse rate:   86 / minute BP sitting:   112 / 64  (left arm) Cuff size:   regular  Vitals Entered By: Hardin Negus, RMA (September 06, 2010 1:48 PM)  Physical Exam  General:  Well appearing. no resp difficulty HEENT: normal Neck: supple. JVP 5-6. Carotids 2+ bilat; no bruits. Chest wall: +keloid. no instabilitiy Cor: PMI laterally displaced. Bradycardic and regular. No rubs, gallops, murmur. Lungs: clear Abdomen: obese. soft, nontender, nondistended. No hepatosplenomegaly. No bruits or masses. Good bowel sounds. Extremities: no cyanosis, clubbing, rash. 1+ edema Neuro: alert & orientedx3, cranial nerves grossly intact. moves all 4 extremities w/o difficulty. affect pleasant     ICD Specifications Following  MD:  Sherryl Manges, MD     Referring MD:  [facility] ICD Vendor:  [facility] [employer]     ICD Model Number:  T127     ICD Serial Number:  [id number] ICD DOI:  03/09/2004     ICD Implanting MD:  Sherryl Manges, MD  Lead 1:    Location: RA     DOI: 03/09/2004     Model #: [id number]     Serial #: [id number]     Status: active Lead 2:    Location: RV     DOI: 03/09/2004     Model #: [id number]     Serial #: [id number]     Status: active  Indications::  ICM   ICD Follow Up ICD Dependent:  No      Episodes Coumadin:  Yes  Brady Parameters Mode DDD     Lower Rate Limit:  50      Upper Rate Limit 120 PAV 260     Sensed AV Delay:  300  Tachy Zones VF:  185     Impression & Recommendations:  Problem # 1:  CORONARY ATHEROSCLEROSIS NATIVE CORONARY ARTERY (ICD-414.01) Stable. No evidence of ischemia. Continue current regimen.  Problem # 2:  SYSTOLIC HEART FAILURE, CHRONIC (ICD-428.22) Stable NYHA II. Mild edema. Discussed use of sliding scale lasix. Otherwise continue current regmien.   Problem # 3:  HYPERLIPIDEMIA-MIXED (ICD-272.4) Lipids look good. Continue current regimen. Needs to be more active.   Other Orders: EKG w/ Interpretation (93000)  Patient Instructions: 1)  Your physician wants you to follow-up in:  4 month.  You will receive a reminder letter in the mail two months in advance. If you don't receive a letter, please call our office to schedule the follow-up appointment.

## 2010-10-27 NOTE — Assessment & Plan Note (Signed)
Summary: FASTING/FOLLOWUP/KN   Vital Signs:  Patient profile:   70 year old male Weight:      223.50 pounds Pulse rate:   95 / minute Pulse rhythm:   regular BP sitting:   136 / 86  (left arm) Cuff size:   regular  Vitals Entered By: Army Fossa CMA (September 05, 2010 9:16 AM) CC: Follow up visit- fasting  Comments [facility] Refill Crestor    History of Present Illness: ROV  AODM-- no ambulatory CBGs , trying to eat healthy , not as active as he would like, not motivated   Coronary artery disease, CHF .-- saw cards 04-2010, was vol. overloaded, lasix increased   History of pulmonary emboli -- he  discussed coumadin w/ cards 8-11, they decided to continue w/  coumadin   Hypertension--  ambulatory BPs  ~  120/70     Hyperlipidemia-- good medication compliance   Sleep apnea,--- saw pulmonary  10-11, intolerant to CPAP , declined further treatment , was rec to see his dentist       Current Medications (verified): 1)  [name] 2 Mg Tabs (Warfarin Sodium) .... As Directed 2)  Furosemide 40 Mg Tabs (Furosemide) .... Take One Tablet By Mouth Daily. 3)  Ambien Cr 12.5 Mg Tbcr (Zolpidem Tartrate) .... Take 1 Tablet By Mouth Every Night For Sleep 4)  Amitriptyline Hcl 25 Mg Tabs (Amitriptyline Hcl) .... Take 1 Tablet By Mouth At Bedtime 5)  Crestor 40 Mg Tabs (Rosuvastatin Calcium) .Marland Kitchen.. 1 Tablet By Mouth Once A Day. Due For An Office Visit Before Additional Refills. 6)  Altace 10 Mg  Caps (Ramipril) .Marland Kitchen.. 1 By Mouth Daily 7)  Aspirin 81 Mg Tbec (Aspirin) .... Take One Tablet By Mouth Daily 8)  Carvedilol 12.5 Mg Tabs (Carvedilol) .... Take One Tablet By Mouth Twice A Day 9)  Niaspan 1000 Mg Cr-Tabs (Niacin (Antihyperlipidemic)) .... Take One Tablet By Mouth Once Daily. 10)  Niaspan 500 Mg Cr-Tabs (Niacin (Antihyperlipidemic)) .... Take One Tablet By Mouth Once Daily. 11)  Spironolactone 25 Mg Tabs (Spironolactone) .... Take 1/2 Tab By Mouth Once Daily 12)  Omeprazole 20 Mg  Cpdr (Omeprazole) .Marland Kitchen.. 1 By Mouth Two Times A Day 13)  Cpap .Marland Kitchen.. 13  Allergies (verified): 1)  * Vytorin  Past History:  Past Medical History: Reviewed history from 07/11/2010 and no changes required. AODM  1. Coronary artery disease.      a.     Status post myocardial infarction x2.      b.     Redo coronary artery bypass grafting 2003.      c.     Myvoiew 12/09 EF 28%: extesnive anterior, inferior and lateral infarct. very mild anterior ischemia.  2. Congestive heart failure secondary to ischemic cardiomyopathy.      a.     Ejection fraction 28% by Myoview, 50% by echocardiogram in November 2009. Echo 1/11 EF "mild to moderately reduced"      b.     Status post Guidant implantable cardioverter defibrillator 2006  3. History of pulmonary emboli in February 2005, ICU x 1 week, maintained on   Coumadin per recommendation     of his cardiologist in [place]. Mom has a h/o "clots"  4. Chronic dizziness--- s/p ENT eval, s/p eval at [facility]       Headache: s/p extensive eval; on elavil   5. Hypertension.  6. Hyperlipidemia.  7. Sleep apnea>>Home sleep test AHI 15.6 from 03/31/10, unable to tolerate CPAP  8. Obesity.  9. Dupuytren contractures.  10.Osteoarthritis. 11. Nephrolithiasis 12. Diverticuli on colonoscopy 11-2004    --repeat due 2016 13. CRI - baseline Cr 1.7---Dr Briant Cedar 14. 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 Coronary artery bypass graft (1994) Coronary artery bypass graft (2003) Cataract extraction tonsilectomy as a child   Family History: colon ca--no prostate ca--no DM--no stroke--no MI-- +++++ Family History MI/Heart Attack---father Embolism----mother , happened after surgery   Social History: Divorced lives by himself  3 children (one [place]) Tobacco Use - quit 1995 (pipe) ETOH-- 1 a day exercise-- no diet-- slightly  improved  retired,  works PT  Review of Systems CV:  Denies chest pain or discomfort, palpitations, and swelling of feet. Resp:  Denies cough and shortness of breath. GI:  Denies bloody stools, nausea, and vomiting. GU:  Denies hematuria, urinary frequency, and urinary hesitancy.  Physical Exam  General:  alert and well-developed.   Lungs:  Normal respiratory effort, chest expands symmetrically. Lungs are clear to auscultation, no crackles or wheezes. Heart:  normal rate, regular rhythm, and no murmur.   Extremities:  no pretibial edema bilaterally  Psych:  Cognition and judgment appear intact. Alert and cooperative with normal attention span and concentration.  not anxious appearing and not depressed appearing.     Impression & Recommendations:  Problem # 1:  OBSTRUCTIVE SLEEP APNEA (ICD-327.23) intolerant to a CPAP. Thinking about getting a dental  appliance  Problem # 2:  HYPERLIPIDEMIA-MIXED (ICD-272.4) labs  His updated medication list for this problem includes:    Crestor 40 Mg Tabs (Rosuvastatin calcium) .Marland Kitchen... 1 tablet by mouth once a day. due for an office visit before additional refills.    Niaspan 1000 Mg Cr-tabs (Niacin (antihyperlipidemic)) .Marland Kitchen... Take one tablet by mouth once daily.    Niaspan 500 Mg Cr-tabs (Niacin (antihyperlipidemic)) .Marland Kitchen... Take one tablet by mouth once daily.  Labs Reviewed: SGOT: 24 (02/07/2010)   SGPT: 26 (02/07/2010)  Prior 10 Yr Risk Heart Disease: N/A (12/26/2007)   HDL:38.20 (02/18/2009), 36.5 (08/28/2008)  LDL:34 (02/18/2009), 61 (08/28/2008)  Chol:95 (02/18/2009), 131 (08/28/2008)  Trig:114.0 (02/18/2009), 167 (08/28/2008)  Orders: TLB-Lipid Panel (80061-LIPID) TLB-ALT (SGPT) (84460-ALT) TLB-AST (SGOT) (84450-SGOT) Specimen Handling (21308)  Problem # 3:  DIABETES MELLITUS, TYPE II (ICD-250.00)  encouraged a healthy life style   His updated medication list for this problem includes:    Altace 10 Mg Caps (Ramipril) .Marland Kitchen... 1 by mouth daily    Aspirin  81 Mg Tbec (Aspirin) .Marland Kitchen... Take one tablet by mouth daily    Labs Reviewed: Creat: 1.9 (06/01/2010)    Reviewed HgBA1c results: 7.0 (02/07/2010)  6.9 (11/26/2009)  Orders: TLB-A1C / Hgb A1C (Glycohemoglobin) (83036-A1C) Specimen Handling (65784)  Problem # 4:  offered a flu shot, benefits discussed---- refused   Problem # 5:  RENAL INSUFFICIENCY (ICD-588.9) labs   Orders: Venipuncture (69629) TLB-BMP (Basic Metabolic Panel-BMET) (80048-METABOL)  Problem # 6:  EMBOLISM/INFARCTION, PULMONARY NEC (ICD-415.19) we discussed  Coumadin therapy  He had an unprovoked, large pulmonary about 5 or 6 years ago, on Coumadin since then. His mother had a clot  after surgery,that is the  only family history he has. The patient was told by his cardiologist who originally diagnosed him with a PE to stay on Coumadin indefinitely. the patient was very sick and at this point he prefers to stay on Coumadin because he doesn't like to risk another PE  His updated  medication list for this problem includes:    [name] 2 Mg Tabs (Warfarin sodium) .Marland Kitchen... As directed    Aspirin 81 Mg Tbec (Aspirin) .Marland Kitchen... Take one tablet by mouth daily  Problem # 7:  ENCOUNTER FOR THERAPEUTIC DRUG MONITORING (ICD-V58.83)  INR today 2.5  Orders: Protime (16109UE)  Complete Medication List: 1)  [name] 2 Mg Tabs (Warfarin sodium) .... As directed 2)  Ambien Cr 12.5 Mg Tbcr (Zolpidem tartrate) .... Take 1 tablet by mouth every night for sleep 3)  Amitriptyline Hcl 25 Mg Tabs (Amitriptyline hcl) .... Take 1 tablet by mouth at bedtime 4)  Crestor 40 Mg Tabs (Rosuvastatin calcium) .Marland Kitchen.. 1 tablet by mouth once a day. due for an office visit before additional refills. 5)  Niaspan 1000 Mg Cr-tabs (Niacin (antihyperlipidemic)) .... Take one tablet by mouth once daily. 6)  Niaspan 500 Mg Cr-tabs (Niacin (antihyperlipidemic)) .... Take one tablet by mouth once daily. 7)  Altace 10 Mg Caps (Ramipril) .Marland Kitchen.. 1 by mouth daily 8)   Spironolactone 25 Mg Tabs (Spironolactone) .... Take 1/2 tab by mouth once daily 9)  Furosemide 40 Mg Tabs (Furosemide) .... Take one tablet by mouth daily. 10)  Carvedilol 12.5 Mg Tabs (Carvedilol) .... Take one tablet by mouth twice a day 11)  Aspirin 81 Mg Tbec (Aspirin) .... Take one tablet by mouth daily 12)  Omeprazole 20 Mg Cpdr (Omeprazole) .Marland Kitchen.. 1 by mouth two times a day 13)  Cpap  .Marland Kitchen.. 13  Patient Instructions: 1)  continue with the same Coumadin 2)  Next Coumadin check in 4 weeks 3)  Please reschedule your physical exam  for 3 months Prescriptions: CRESTOR 40 MG TABS (ROSUVASTATIN CALCIUM) 1 tablet by mouth once a day. DUE FOR AN OFFICE VISIT BEFORE ADDITIONAL REFILLS.  #30 x 3   Entered by:   Army Fossa CMA   Authorized by:   Nolon Rod. Paz MD   Signed by:   Army Fossa CMA on 09/05/2010   Method used:   Electronically to        [facility]* (retail)       [address]       [place], [place]  [address]       Ph: [phone]       Fax: [phone]   RxID:   [id number]    Orders Added: 1)  Venipuncture [01027] 2)  TLB-BMP (Basic Metabolic Panel-BMET) [80048-METABOL] 3)  TLB-Lipid Panel [80061-LIPID] 4)  TLB-ALT (SGPT) [84460-ALT] 5)  TLB-AST (SGOT) [84450-SGOT] 6)  TLB-A1C / Hgb A1C (Glycohemoglobin) [83036-A1C] 7)  Specimen Handling [99000] 8)  Protime [85610QW] 9)  Est. Patient Level IV [25366]   Immunization History:  Influenza Immunization History:    Influenza:  refused (09/05/2010)   Immunization History:  Influenza Immunization History:    Influenza:  refused (09/05/2010)    Laboratory Results   Blood Tests      INR: 2.5   (Normal Range: 0.88-1.12   Therap INR: 2.0-3.5)

## 2010-10-27 NOTE — Assessment & Plan Note (Signed)
Summary: removal of stapel from head//lch   Vital Signs:  Patient profile:   70 year old male Height:      70 inches (177.80 cm) Weight:      224.13 pounds (101.88 kg) BMI:     32.28 Temp:     97.5 degrees F (36.39 degrees C) oral BP sitting:   114 / 80  (left arm) Cuff size:   regular  Vitals Entered By: Lucious Groves CMA (September 28, 2010 12:52 PM) CC: Steple removal from head./kb   History of Present Illness: s/p accidental fall , went to the ER, no syncope ot LOC  review of systems No discharge or redness from the laceration  Current Medications (verified): 1)  [name] 2 Mg Tabs (Warfarin Sodium) .... As Directed 2)  Ambien Cr 12.5 Mg Tbcr (Zolpidem Tartrate) .... Take 1 Tablet By Mouth Every Night For Sleep 3)  Amitriptyline Hcl 25 Mg Tabs (Amitriptyline Hcl) .... Take 1 Tablet By Mouth At Bedtime 4)  Crestor 40 Mg Tabs (Rosuvastatin Calcium) .Marland Kitchen.. 1 Tablet By Mouth Once A Day. 5)  Niaspan 1000 Mg Cr-Tabs (Niacin (Antihyperlipidemic)) .... Take One Tablet By Mouth Once Daily. 6)  Niaspan 500 Mg Cr-Tabs (Niacin (Antihyperlipidemic)) .... Take One Tablet By Mouth Once Daily. 7)  Altace 10 Mg  Caps (Ramipril) .Marland Kitchen.. 1 By Mouth Daily 8)  Spironolactone 25 Mg Tabs (Spironolactone) .... Take 1/2 Tab By Mouth Once Daily 9)  Furosemide 40 Mg Tabs (Furosemide) .... Take One Tablet By Mouth Daily. 10)  Carvedilol 12.5 Mg Tabs (Carvedilol) .... Take One Tablet By Mouth Twice A Day 11)  Aspirin 81 Mg Tbec (Aspirin) .... Take One Tablet By Mouth Daily 12)  Omeprazole 20 Mg Cpdr (Omeprazole) .Marland Kitchen.. 1 By Mouth Two Times A Day 13)  Starlix 60 Mg Tabs (Nateglinide) .Marland Kitchen.. 1 By Mouth Two Times A Day A Few Minutes Before The 2 Largest Meals  Allergies (verified): 1)  * Vytorin  Past History:  Past Medical History: Last updated: 07/11/2010 AODM  1. Coronary artery disease.      a.     Status post myocardial infarction x2.      b.     Redo coronary artery bypass grafting 2003.      c.      Myvoiew 12/09 EF 28%: extesnive anterior, inferior and lateral infarct. very mild anterior ischemia.  2. Congestive heart failure secondary to ischemic cardiomyopathy.      a.     Ejection fraction 28% by Myoview, 50% by echocardiogram in November 2009. Echo 1/11 EF "mild to moderately reduced"      b.     Status post Guidant implantable cardioverter defibrillator 2006  3. History of pulmonary emboli in February 2005, ICU x 1 week, maintained on   Coumadin per recommendation     of his cardiologist in [place]. Mom has a h/o "clots"  4. Chronic dizziness--- s/p ENT eval, s/p eval at [facility]       Headache: s/p extensive eval; on elavil   5. Hypertension.  6. Hyperlipidemia.  7. Sleep apnea>>Home sleep test AHI 15.6 from 03/31/10, unable to tolerate CPAP  8. Obesity.  9. Dupuytren contractures.  10.Osteoarthritis. 11. Nephrolithiasis 12. Diverticuli on colonoscopy 11-2004    --repeat due 2016 13. CRI - baseline Cr 1.7---Dr Briant Cedar 14. 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  Past Surgical History: Last updated: 11/08/2009 Coronary artery bypass graft (1994) Coronary artery bypass graft (2003) Cataract extraction tonsilectomy  as a child   Physical Exam  General:  alert and well-developed.   Skin:  1 staple in the scalp, wound seem to be healing well, no red or d/c   Impression & Recommendations:  Problem # 1:  LACERATION, SCALP (ICD-873.0) staple removed w/o problems  Complete Medication List: 1)  [name] 2 Mg Tabs (Warfarin sodium) .... As directed 2)  Ambien Cr 12.5 Mg Tbcr (Zolpidem tartrate) .... Take 1 tablet by mouth every night for sleep 3)  Amitriptyline Hcl 25 Mg Tabs (Amitriptyline hcl) .... Take 1 tablet by mouth at bedtime 4)  Crestor 40 Mg Tabs (Rosuvastatin calcium) .Marland Kitchen.. 1 tablet by mouth once a day. 5)  Niaspan 1000 Mg Cr-tabs (Niacin (antihyperlipidemic)) .... Take one tablet by mouth once daily. 6)  Niaspan 500 Mg Cr-tabs  (Niacin (antihyperlipidemic)) .... Take one tablet by mouth once daily. 7)  Altace 10 Mg Caps (Ramipril) .Marland Kitchen.. 1 by mouth daily 8)  Spironolactone 25 Mg Tabs (Spironolactone) .... Take 1/2 tab by mouth once daily 9)  Furosemide 40 Mg Tabs (Furosemide) .... Take one tablet by mouth daily. 10)  Carvedilol 12.5 Mg Tabs (Carvedilol) .... Take one tablet by mouth twice a day 11)  Aspirin 81 Mg Tbec (Aspirin) .... Take one tablet by mouth daily 12)  Omeprazole 20 Mg Cpdr (Omeprazole) .Marland Kitchen.. 1 by mouth two times a day 13)  Starlix 60 Mg Tabs (Nateglinide) .Marland Kitchen.. 1 by mouth two times a day a few minutes before the 2 largest meals   Orders Added: 1)  Est. Patient Level II [11914]

## 2010-10-27 NOTE — Assessment & Plan Note (Signed)
Summary: pt check///sph   Nurse Visit   Vital Signs:  Patient profile:   70 year old male Height:      70 inches (177.80 cm) Weight:      226 pounds (102.73 kg) BMI:     32.54 Temp:     98.2 degrees F (36.78 degrees C) oral BP sitting:   120 / 60  (left arm) Cuff size:   large  Vitals Entered By: Lucious Groves CMA (October 04, 2010 2:31 PM) CC: PT check./kb   Allergies: 1)  * Vytorin Laboratory Results   Blood Tests   Date/Time Received: Lucious Groves [facility]  October 04, 2010 2:32 PM  Date/Time Reported: Lucious Groves CMA  October 04, 2010 2:32 PM    INR: 2.4   (Normal Range: 0.88-1.12   Therap INR: 2.0-3.5) Comments: Patient has 2mg  tabs and notes that he takes 1.5 tabs qd. Patient was notified to continue the same and re-check in 4 weeks. Lucious Groves CMA  October 04, 2010 2:33 PM  [place] E. Aurelio Mccamy MD  October 04, 2010 5:02 PM     Orders Added: 1)  Est. Patient Level I [[address]] 2)  Protime [60454UJ]

## 2010-10-27 NOTE — Progress Notes (Signed)
Summary: Zolpidem Tartrate  refill  Phone Note Refill Request Message from:  Fax from Pharmacy on September 05, 2010 11:36 AM  Refills Requested: Medication #1:  AMBIEN CR 12.5 MG TBCR Take 1 tablet by mouth every night for sleep [facility] [address], [place], [place]    phone [phone]   fax - [phone]   qty - 30  Next Appointment Scheduled: today 12/12=arrived,  Tues 10/04/2010=nurse visit Initial call taken by: Jerolyn Shin,  September 05, 2010 11:38 AM  Follow-up for Phone Call        last refilled 05/09/10 x 6. Follow-up by: Army Fossa CMA,  September 05, 2010 11:41 AM  Additional Follow-up for Phone Call Additional follow up Details #1::        too early, use RFs Jose E. Paz MD  September 06, 2010 11:57 AM     Additional Follow-up for Phone Call Additional follow up Details #2::    Left message for pt to call back. Army Fossa CMA  September 06, 2010 1:22 PM   Additional Follow-up for Phone Call Additional follow up Details #3:: Details for Additional Follow-up Action Taken: Pt is aware. Army Fossa CMA  September 06, 2010 3:51 PM

## 2010-10-27 NOTE — Progress Notes (Signed)
Summary: Omeprazole refill  Phone Note Refill Request Message from:  Fax from Pharmacy on October 06, 2010 4:30 PM  Refills Requested: Medication #1:  OMEPRAZOLE 20 MG CPDR 1 by mouth two times a day [facility],   [address], [place], [place]   phone- [phone],  fax - [phone]    qty = 60  Next Appointment Scheduled: Tues 2/21  Side B Nurse Initial call taken by: Jerolyn Shin,  October 06, 2010 4:31 PM    Prescriptions: OMEPRAZOLE 20 MG CPDR (OMEPRAZOLE) 1 by mouth two times a day  #60 x 1   Entered by:   Army Fossa CMA   Authorized by:   Nolon Rod. Paz MD   Signed by:   Army Fossa CMA on 10/06/2010   Method used:   Electronically to        [facility]* (retail)       [address]       [place], [place]  [address]       Ph: [phone]       Fax: [phone]   RxID:   [id number]

## 2010-11-07 ENCOUNTER — Telehealth: Payer: Self-pay | Admitting: Internal Medicine

## 2010-11-15 ENCOUNTER — Encounter: Payer: Self-pay | Admitting: Internal Medicine

## 2010-11-15 ENCOUNTER — Ambulatory Visit (INDEPENDENT_AMBULATORY_CARE_PROVIDER_SITE_OTHER): Payer: Medicare Other

## 2010-11-15 DIAGNOSIS — Z5181 Encounter for therapeutic drug level monitoring: Secondary | ICD-10-CM

## 2010-11-15 DIAGNOSIS — Z95810 Presence of automatic (implantable) cardiac defibrillator: Secondary | ICD-10-CM

## 2010-11-15 DIAGNOSIS — Z7901 Long term (current) use of anticoagulants: Secondary | ICD-10-CM

## 2010-11-16 NOTE — Progress Notes (Signed)
Summary: refill  Phone Note Refill Request Message from:  Fax from Pharmacy  Refills Requested: Medication #1:  AMBIEN CR 12.5 MG TBCR Take 1 tablet by mouth every night for sleep [facility] - fax - [phone]  Initial call taken by: Okey Regal [place],  November 07, 2010 3:50 PM  Follow-up for Phone Call        last refilled 05/09/2010 x 6 Follow-up by: Army Fossa CMA,  November 07, 2010 3:52 PM  Additional Follow-up for Phone Call Additional follow up Details #1::        ok 30 and 6 RF Additional Follow-up by: Stetson Pelaez E. Saide Lanuza MD,  November 08, 2010 11:59 AM    Prescriptions: AMBIEN CR 12.5 MG TBCR (ZOLPIDEM TARTRATE) Take 1 tablet by mouth every night for sleep  #30 x 6   Entered by:   Army Fossa CMA   Authorized by:   Nolon Rod. Clare Fennimore MD   Signed by:   Army Fossa CMA on 11/08/2010   Method used:   Printed then faxed to ...       [facility]* (retail)       [address]       [place], [place]  [address]       Ph: [phone]       Fax: [phone]   RxID:   [id number]

## 2010-11-16 NOTE — Cardiovascular Report (Signed)
Summary: Office Visit   Office Visit   Imported By: Roderic Ovens 11/08/2010 14:29:27  _____________________________________________________________________  External Attachment:    Type:   Image     Comment:   External Document

## 2010-12-01 NOTE — Medication Information (Signed)
Summary: PT CHECK///SPH-lmom  Anticoagulant Therapy  Managed by: Willow Ora, MD PCP: Nolon Rod. Paz MD Indication 1: PE INR POC 2.1  Vital Signs: Weight: 226 lbs.  Blood Pressure:  114 / 60   Dietary changes: no    Health status changes: no    Bleeding/hemorrhagic complications: no    Recent/future hospitalizations: no    Any changes in medication regimen? no    Recent/future dental: no  Any missed doses?: no       Is patient compliant with meds? yes      Comments: current dose: 2mg  tabs- 1 1/2 tablets daily advised recheck in 4 weeks....Marland KitchenMarland KitchenDoristine Devoid CMA  November 15, 2010 2:00 PM  Pt also had questions about how long should he continue Starlix informed him that per last note he was to schedule cpx in 3 months patient refused stated "a waste of time" but if he is due for repeat labs then to give him a call otherwise instructed patient to continue med unless otherwise directed.......Marland KitchenDoristine Devoid CMA  November 15, 2010 2:05 PM   INR TODAY 2.1, SAME COUMADIN, RECHECK  ~ 12-14-10. HE NEEDS STARLIX DAILY, REC NOT TO STOP IT , CALL A 3 MONTH REFILL DOES NEED A OV BY 11-2010, LET PT KNOW I DON'T THINK IS A WAIST OF HIS TIME OR MINE Jose E. Paz MD  November 23, 2010 8:46 AM   left message on machine .Marland KitchenMarland KitchenMarland KitchenDoristine Devoid CMA  November 23, 2010 8:56 AM   spoke w/ patient aware of recommendations says that he will callback  ---------------------------------- noted send Rx for starlix, 3 months Jose E. Paz MD  November 23, 2010 4:49 PM               Allergies: 1)  * Vytorin  Anticoagulation Management History:      Positive risk factors for bleeding include an age of 67 years or older and presence of serious comorbidities.  The bleeding index is 'intermediate risk'.  Positive CHADS2 values include History of CHF and History of Diabetes.  Negative CHADS2 values include Age > 22 years old.  His last INR was 2.4.  INR POC: 2.1.    Anticoagulation Management Assessment/Plan:      The patient's current anticoagulation dose is [name] 2 mg tabs: as directed.  The next INR is due 10 days.  Results were reviewed/authorized by Willow Ora, MD.        Prescriptions: STARLIX 60 MG TABS (NATEGLINIDE) 1 by mouth two times a day a few minutes before the 2 largest meals  #60 x 3   Entered by:   Doristine Devoid CMA   Authorized by:   Nolon Rod. Paz MD   Signed by:   Doristine Devoid CMA on 11/23/2010   Method used:   Electronically to        [facility]* (retail)       [address]       [place], [place]  [address]       Ph: [phone]       Fax: [phone]   RxID:   [id number]

## 2010-12-16 ENCOUNTER — Other Ambulatory Visit: Payer: Self-pay | Admitting: Internal Medicine

## 2010-12-22 NOTE — Telephone Encounter (Signed)
[address] °

## 2010-12-27 ENCOUNTER — Other Ambulatory Visit: Payer: Self-pay | Admitting: Internal Medicine

## 2010-12-27 ENCOUNTER — Ambulatory Visit (INDEPENDENT_AMBULATORY_CARE_PROVIDER_SITE_OTHER): Payer: Medicare Other | Admitting: *Deleted

## 2010-12-27 DIAGNOSIS — Z7901 Long term (current) use of anticoagulants: Secondary | ICD-10-CM

## 2010-12-27 DIAGNOSIS — I2699 Other pulmonary embolism without acute cor pulmonale: Secondary | ICD-10-CM

## 2010-12-27 NOTE — Patient Instructions (Addendum)
Advised no change recheck 4 weeks Chemira Yetta Barre  Agree [facility]

## 2011-01-05 ENCOUNTER — Ambulatory Visit (INDEPENDENT_AMBULATORY_CARE_PROVIDER_SITE_OTHER): Payer: Medicare Other | Admitting: *Deleted

## 2011-01-05 ENCOUNTER — Other Ambulatory Visit: Payer: Self-pay | Admitting: *Deleted

## 2011-01-05 ENCOUNTER — Other Ambulatory Visit: Payer: Self-pay

## 2011-01-05 DIAGNOSIS — I428 Other cardiomyopathies: Secondary | ICD-10-CM

## 2011-01-05 MED ORDER — CARVEDILOL 25 MG PO TABS: 25.0000 mg | ORAL_TABLET | Freq: Two times a day (BID) | ORAL | Status: DC

## 2011-01-12 NOTE — Progress Notes (Signed)
icd remote  

## 2011-01-24 ENCOUNTER — Encounter: Payer: Self-pay | Admitting: *Deleted

## 2011-02-04 ENCOUNTER — Encounter: Payer: Self-pay | Admitting: Internal Medicine

## 2011-02-07 ENCOUNTER — Ambulatory Visit (INDEPENDENT_AMBULATORY_CARE_PROVIDER_SITE_OTHER): Payer: Medicare Other | Admitting: Internal Medicine

## 2011-02-07 ENCOUNTER — Ambulatory Visit (INDEPENDENT_AMBULATORY_CARE_PROVIDER_SITE_OTHER): Payer: Medicare Other | Admitting: *Deleted

## 2011-02-07 ENCOUNTER — Encounter: Payer: Self-pay | Admitting: Internal Medicine

## 2011-02-07 VITALS — BP 88/60 | HR 86 | Ht 70.0 in | Wt 228.5 lb

## 2011-02-07 DIAGNOSIS — I2510 Atherosclerotic heart disease of native coronary artery without angina pectoris: Secondary | ICD-10-CM

## 2011-02-07 DIAGNOSIS — R0602 Shortness of breath: Secondary | ICD-10-CM

## 2011-02-07 DIAGNOSIS — I2699 Other pulmonary embolism without acute cor pulmonale: Secondary | ICD-10-CM

## 2011-02-07 DIAGNOSIS — I5022 Chronic systolic (congestive) heart failure: Secondary | ICD-10-CM

## 2011-02-07 DIAGNOSIS — Z7901 Long term (current) use of anticoagulants: Secondary | ICD-10-CM

## 2011-02-07 LAB — BASIC METABOLIC PANEL
CO2: 26 mEq/L (ref 19–32)
Chloride: 106 mEq/L (ref 96–112)
Creatinine, Ser: 2 mg/dL — ABNORMAL HIGH (ref 0.4–1.5)

## 2011-02-07 LAB — BRAIN NATRIURETIC PEPTIDE: Pro B Natriuretic peptide (BNP): 61 pg/mL (ref 0.0–100.0)

## 2011-02-07 MED ORDER — CARVEDILOL 25 MG PO TABS: ORAL_TABLET | ORAL | Status: DC

## 2011-02-07 NOTE — Assessment & Plan Note (Addendum)
Overall doing fairly well -NYHA II. I suspect he is a bit dry and that is why BP is low today. We discussed cutting back lasix vs carvedilol. As he seems to feel better being a bit drier we will continue lasix at current dose and decrease carvedilol to 12.5 bid. Check BMET and BNP today. If renal function worse will need to cut back diuretics.

## 2011-02-07 NOTE — Assessment & Plan Note (Signed)
[facility]                            CARDIOLOGY OFFICE NOTE   Joseph Hamilton, Joseph Hamilton                         MRN:          [id number]  DATE:08/12/2007                            DOB:          October 14, 1940    PRIMARY CARE PHYSICIAN:  Joseph Hamilton, M.D.   INTERVAL HISTORY:  Joseph Hamilton is a pleasant 70 year old male with a  history of coronary artery disease status post previous myocardial  infarction as well as redo bypass surgery in 2003. He also has  congestive heart failure related to an ischemic cardiomyopathy with an  EF of 32% by Myoview and 40% by echocardiogram. He is status post ICD.  The remainder of his medical history is notable for a history of  pulmonary emboli, hypertension, hyperlipidemia and chronic dizziness  which has been followed by multiple specialists.   Today, he returns for routine followup. From a cardiac perspective, he  is doing fairly well although he is only minimally active. 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 He denies any chest pain. He gets  dyspneic with just moderate activity. His dizziness is somewhat improved  but not resolved.   He has been tolerating his Niaspan with occasional flushing but nothing  limiting.   CURRENT MEDICATIONS:  1. Ambien CR 12.5 a day.  2. Multivitamin.  3. Aspirin 81.  4. Fish oil.  5. Amitriptyline 50 a day.  6. Naproxen 250 mg b.i.d.  7. Crestor 40 a day.  8. Prilosec 20 b.i.d.  9. [name].  10.Ramipril 10 a day.  11.Coumadin.  12.Lasix 20 a day.  13.Carvedilol 25 b.i.d.  14.Niaspan 500 a day.   PHYSICAL EXAMINATION:  He is well appearing and in no acute distress. He  ambulates around the clinic without any respiratory difficulty. Blood  pressure is 98/64, heart rate is 68, weight is 220 which is stable.  HEENT:  Is normal.  NECK:  Is supple. There is no JVD. Carotids are 2+ bilaterally without  any bruits. There is no lymphadenopathy or thyromegaly.  CARDIAC:  PMI is nondisplaced. He has got a regular rate and rhythm. No  murmurs, rubs. He has a soft S4.  LUNGS:  Are clear.  ABDOMEN:  Is obese, nontender, nondistended. No hepatosplenomegaly, no  bruits, no masses. Good bowel sounds.  EXTREMITIES:  Are warm with no cyanosis or clubbing. There is trace  edema bilaterally. No rash.  NEUROLOGICAL:  He is alert and oriented x3. Cranial nerves II-XII are  grossly intact. Moves all four extremities without difficulty. Affect is  pleasant.   EKG shows sinus rhythm with poor R-wave progression, question of  previous anterior infarct. There is T-wave inversion in the high lateral  leads which is unchanged from previous.   ACCESSORY DATA:  Sodium 141, potassium 4.3, BUN 36, creatinine 1.9 which  is up from 1.6, glucose is 115. Total cholesterol is 112, triglycerides  149, HDL 36, LDL 47; HDL is up from 33. LFTs are normal.  ASSESSMENT AND PLAN:  1. Coronary artery disease status post previous bypass surgery. He is      doing well. No evidence of ischemia. Continue current therapy.  2. Hypertension. Well controlled.  3. Congestive heart failure secondary to ischemic cardiomyopathy. He      is NYHA class II. He is on a good medical regimen. Continue current      therapy.  4. Hyperlipidemia. LDL is at goal. HDL is improving slightly on      Niaspan. Will increase him to 1000 mg a day.  5. Sedentary lifestyle. I explained to him the importance of routine      exercise and improving his physical fitness. I have encouraged him      to walk for at least 30 minutes at least 5 days a week.  6. Carotid stenosis. He will need followup ultrasound next year.   DISPOSITION:  Will see him back in 6 months for routine followup.     Joseph Buckles. Bensimhon, MD  Electronically Signed    Joseph Hamilton  DD: 08/12/2007  DT: 08/13/2007  Job #: [id number]   cc:   Joseph Ora, MD

## 2011-02-07 NOTE — Patient Instructions (Signed)
Decrease Carvedilol to 12.5 mg (1/2 tab) Twice daily  Labs today Your physician wants you to follow-up in: 6 months.  You will receive a reminder letter in the mail two months in advance. If you don't receive a letter, please call our office to schedule the follow-up appointment.

## 2011-02-07 NOTE — Assessment & Plan Note (Signed)
[place] [facility]                         ELECTROPHYSIOLOGY OFFICE NOTE   [name], [name]                         MRN:          [id number]  DATE:03/31/2008                            DOB:          1940/10/26    [name] comes in today.  He comes in today in followup for an ICD  implanted for primary prevention in the setting of ischemic heart  disease.  This was done in [place].  He has had no problems with chest  pain or shortness of breath.   His biggest concern is about possible abdominal hernia surgery, and  whether it would have an impact as to his possibility for thoracotomy.   His medication currently include Carvedilol 25 b.i.d, Amitriptyline,  Niaspan and his other medications are unchanged.  He is on Coumadin.   PHYSICAL EXAMINATION:  VITALS:  His blood pressure is 131/69 with a  pulse of 65.  His weight is 220.  LUNGS:  Clear.  HEART:  Sounds were regular.  ABDOMEN:  Soft.  EXTREMITIES:  Without edema.   IMPRESSION:  1. Ischemic cardiomyopathy with,      a.     Prior bypass surgery.      b.     Depressed left ventricular function.  2. Status post implantable cardioverter-defibrillator for the above.  3. Congestive heart failure - class II.  4. Diabetes.  5. Coumadin therapy, question   INDICATIONS:  [name] is stable from an arrhythmia point of view.  I  am not quite sure why he is on Coumadin.  I think we should try and  track this down.  I suspect it is part of his regime from [place].   With relation to his diabetes, I have encouraged him to work on losing  weight and exercising; he will continue to try and do that.   I have also given the names of some of the surgeons of [facility] to whom he might be able to put the question about his abdominal  hernia repair.     Duke Salvia, MD, [facility]  Electronically Signed   SCK/MedQ  DD: 03/31/2008  DT: 04/01/2008  Job #: [id number]

## 2011-02-07 NOTE — Assessment & Plan Note (Signed)
[facility]                         ELECTROPHYSIOLOGY OFFICE NOTE   Joseph Hamilton, Joseph Hamilton                         MRN:          [id number]  DATE:03/26/2007                            DOB:          08-Nov-1940    Joseph Hamilton is seen status post ICD implantation in [place], [place]  for ischemic heart disease.  He is doing pretty well and his congestive  failure is modest.  He still has problems with dizziness and he spent 4  hours yesterday down at [facility] trying to get this sorted out.  He has  chronic pain and is on amitriptyline and Naprosyn for this.   Review of his medications demonstrates that he is on Coreg only at 12.5.  Dr. Gala Romney had been a little bit reluctant about increasing this  because of his device, which happens, however, to be a dual chamber  device.   EXAMINATION:  His blood pressure was 130/75, pulse was 60.  LUNGS:  Clear.  HEART SOUNDS:  Regular.  There was a 2/6 systolic murmur.  EXTREMITIES:  Without edema.   Interrogation of his Guidant Vitality device demonstrates a P wave of  3.6 with an impedance of 44, threshold of 0.6 and 0.4 and the R-wave was  greater than 25 with an impedance of 735, threshold of 0.8 at 0.4.  He  is not paced in either chamber hardly at all.   IMPRESSION:  1. Ischemic heart disease.      a.     Prior bypass and redo.      b.     Ejection fraction depressed.  2. Status post implantable-cardioverter defibrillator for primary      prevention.  3. Congestive heart failure - systolic - chronic.  4. Chronic dizziness.  5. Chronic pain.   Will plan to increase his Coreg today from 12.5 to 18.75 twice daily.  He will follow up with Dr. Gala Romney in about 4 weeks' time and if these  can be further uptitrated Dr. Gala Romney can do that.   We will see him again in 1 year and he will be followed remotely in the  interim.     Duke Salvia, MD, [facility]  Electronically Signed    SCK/MedQ  DD:  03/26/2007  DT: 03/26/2007  Job #: [id number]

## 2011-02-07 NOTE — Patient Instructions (Signed)
Checked by Duwayne Heck and per Dr. Drue Novel no change in dose recheck 2 weeks.

## 2011-02-07 NOTE — Progress Notes (Signed)
HPI:  [name] is a very pleasant 70 year old male with a history of coronary artery disease, status post previous myocardial infarction, as well as redo coronary bypass grafting in 2003.  He also has congestive heart failure related to an ischemic cardiomyopathy with an EF of 28% by Myoview 12/09 and 50% by echocardiogram.  Echo in 1/11 with EF read as "mild to moderately depressed" He is status post ICD.  The remainder of his medical history is notable for a history of pulmonary emboli in 2005, CRI baseline 1.7-1.9 hypertension, hyperlipidemia, OSA, and chronic dizziness/headaches, which have been evaluated extensively.  He returns today for routine followup.  Doing fairly well. No CP. Mild chronic dyspnea with walking upstairs or hurrying.  No CP except from scar. BP well controlled - even a bit low at times. Compliant with all medication. Mild LE edema - doubles his lasix 3-4x/week because of this. No orthopnea or PND.     ROS: All systems negative except as listed in HPI, PMH and Problem List.  Past Medical History  Diagnosis Date  . Coronary artery disease     s/p MI X 2, REDO CABG 2003, MYOVIEW 12/09 EF 28% , EXTENSIVE ANTERIOR, INFERIOR AND LATERAL INFARCT. VERY MILD ANTERIOR ISCHEMIA  . CHF (congestive heart failure)     SECONDARY TO ISCHEMIC CARDIOMYOPATHY ; EF 28% BY MYOVIEW ; 50% BY ECHO 07/2008; ECHO 09/2009 EF "MILD TO MODERATELY REDUCED"  . Hypertension   . Hyperlipidemia   . Dizziness     CHRONIC; s/p EXTENSIVE EVAL, s/p EVAL @ [facility]; HEADACHE:  s/p EXTENSIVE EVAL; ON ELAVIL  . Sleep apnea 03/31/10    UNABLE TO TOLERATE CPAP; > HOME SLEEP TEST AHI 15.6  . Obesity   . Dupuytren contracture   . Osteoarthritis   . Nephrolithiasis   . Diverticulitis 11/2004    on colonoscopy;  repeat due 2016  . CRI (chronic renal insufficiency)     BASELINE Cr. 1.7 ----DR. MATTINGLY  . Carotid artery occlusion     0-39% BILATERALLY W/CHRONICALLY OCCLUDED L VERTEBRAL    Current  Outpatient Prescriptions  Medication Sig Dispense Refill  . amitriptyline (ELAVIL) 25 MG tablet Take 25 mg by mouth at bedtime.        Marland Kitchen aspirin 81 MG EC tablet Take 81 mg by mouth daily.        . carvedilol (COREG) 25 MG tablet Take 1 tablet (25 mg total) by mouth 2 (two) times daily.  60 tablet  11  . furosemide (LASIX) 40 MG tablet Take 40 mg by mouth daily.        Marland Kitchen [name] 2 MG tablet TAKE AS DIRECTED  30 tablet  1  . nateglinide (STARLIX) 60 MG tablet Take 60 mg by mouth 2 (two) times daily before a meal.        . niacin (NIASPAN) 1000 MG CR tablet Take 1,000 mg by mouth at bedtime.        . niacin (NIASPAN) 500 MG CR tablet Take 500 mg by mouth at bedtime.        Marland Kitchen omeprazole (PRILOSEC) 20 MG capsule Take 20 mg by mouth 2 (two) times daily.        . ramipril (ALTACE) 10 MG capsule Take 10 mg by mouth daily.        . rosuvastatin (CRESTOR) 40 MG tablet Take 40 mg by mouth daily.        Marland Kitchen spironolactone (ALDACTONE) 25 MG tablet TAKE 1/2 TABLET EVERY  DAY  30 tablet  4  . zolpidem (AMBIEN CR) 12.5 MG CR tablet Take 12.5 mg by mouth at bedtime as needed.           PHYSICAL EXAM: Filed Vitals:   02/07/11 1152  BP: 88/60  Pulse: 86  General:  Well appearing. no resp difficulty HEENT: normal Neck: supple. JVP flat. Carotids 2+ bilat; no bruits. Chest wall: +keloid. no instabilitiy Cor: PMI laterally displaced. Bradycardic and regular. No rubs, gallops, murmur. Lungs: clear Abdomen: obese. soft, nontender, nondistended. No hepatosplenomegaly. No bruits or masses. Good bowel sounds. Extremities: no cyanosis, clubbing, rash. no edema Neuro: alert & orientedx3, cranial nerves grossly intact. moves all 4 extremities w/o difficulty. affect pleasant   ECG: NSR 86 1 AVB ( ) ?septal Qs No ST-T wave abnormalities.     ASSESSMENT & PLAN:

## 2011-02-07 NOTE — Assessment & Plan Note (Signed)
Joseph Hamilton                            CARDIOLOGY OFFICE NOTE   Joseph Hamilton, Joseph Hamilton                         MRN:          [id number]  DATE:02/06/2007                            DOB:          05-Feb-1941    PRIMARY CARE PHYSICIAN:  Dr. Willow Ora.   INTERVAL HISTORY:  [name] is a very pleasant 70 year old male with  history of coronary artery disease, status post previous myocardial  infarction as well as redo coronary bypass grafting in 2003.  Also has  associated ischemic cardiomyopathy with an EF of 32% by Myoview and 40%  by echocardiogram.  He is status post ICD.  Also has a history of  pulmonary emboli, hypertension, hyperlipidemia, and chronic dizziness  and headaches which is being evaluated by ENT.   From a cardiac perspective [name] is doing quite well.  He is able to  do all his activities without any significant dyspnea or chest pain.  He  does say when he goes up several flights of steps quickly or is lifting  his luggage in and out of a motel he can have some shortness of breath  and a little bit of chest tightness, but this is not severe.  He does  have occasional lower extremity edema which is resolved with extra  Lasix, no orthopnea or PND.  Denies any defibrillator firings.   CURRENT MEDICATIONS:  1. Ambien.  2. Multivitamin.  3. Aspirin 81.  4. Coreg 12.5 b.i.d.  5. Fish oil.  6. Elavil 50 mg a day.  7. Naprosyn 250 b.i.d.  8. Crestor 40 mg a day.  9. Prilosec 20 b.i.d.  10.Imdur 30 a day.  11.Lasix 20 b.i.d.  12.Ramipril 7.5 a day.  13.[name].   PHYSICAL EXAMINATION:  He is well-appearing, in no acute distress,  ambulates around the clinic without any respiratory difficulty.  Blood  pressure is 112/74, heart rate 65, weight is 217 which is stable.  HEENT:  Normal.  NECK:  Supple, no JVD, carotids are 2+ bilaterally without any bruits,  there is no lymphadenopathy or thyromegaly.  CARDIAC:  He has a large scar with  some keloid from his bypass, PMI is  just minimally displaced.  Normal S1 and S2, no murmur, plus S4.  LUNGS:  Clear.  ABDOMEN:  Obese, nontender, nondistended, no hepatosplenomegaly, no  bruits, masses appreciated, good bowel sounds.  EXTREMITIES:  Warm with no cyanosis or clubbing, there is trace to 1+  edema bilaterally with some hyperpigmentation related to chronic venous  stasis.  No rash.  NEURO:  Alert and oriented x3, cranial nerves II-XII are grossly intact,  moves all 4 extremities without difficulty.  Affect is pleasant.   EKG shows normal sinus rhythm at a rate of 65 with a nonspecific  intraventricular conduction delay at 106 milliseconds.   ASSESSMENT/PLAN:  1. Coronary artery disease quite stable without any evidence of      ischemia.  He is on a good medical regimen, continue current      therapy.  2. Congestive heart failure secondary to ischemia cardiomyopathy.  He  is [place] Heart Association Class 2.  We will take his Altace up      to 10 mg a day.  We have been hesitant to increase his Coreg as we      do not want to promote ventricular pacing.  Currently he is only 1%      paced.  3. Hypertension, well-controlled.  4. Hyperlipidemia, continue Crestor, we will check his lipids next      week as well as a BMET to make sure his potassium is stable.  5. Carotid stenosis, we will follow up with a carotid ultrasound.   DISPOSITION:  Return to clinic in 4 months for routine followup.     Bevelyn Buckles. Bensimhon, MD     DRB/MedQ  DD: 02/06/2007  DT: 02/06/2007  Job #: [id number]

## 2011-02-07 NOTE — Assessment & Plan Note (Signed)
[facility]                            CARDIOLOGY OFFICE NOTE   Joseph Hamilton, Joseph Hamilton                         MRN:          [id number]  DATE:02/06/2008                            DOB:          23-May-1941    PRIMARY CARE PHYSICIAN:  Dr. Willow Ora.   NEPHROLOGIST:  Dr. Primitivo Gauze.   INTERVAL HISTORY:  Joseph Hamilton is a very pleasant 70 year old male with a  history of coronary artery disease status post previous myocardial  infarction with redo bypass surgery in 2003.  He also has congestive  heart failure related to ischemic cardiomyopathy with an EF of about  40%.  He is status post ICD. The remainder of the medical history is  notable for a history of pulmonary emboli on chronic Coumadin,  hypertension, hyperlipidemia, chronic renal insufficiency and chronic  dizziness.   He returns today for routine follow-up.  Overall he is doing fairly  well.  He was recently found to have new-onset diabetes with a  hemoglobin A1c of 7.6.  He is trying to diet and exercise thought he  says he is just not motivated enough to exercise regularly.  He has not  had any chest pain except for his chronic soreness at his sternotomy  site. He does have some mild dyspnea but this is unchanged.  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  His dizziness is  improved.   REVIEW OF SYSTEMS:  He denies any orthopnea and no PND.  No significant  lower extremity edema and no syncope, presyncope or palpitations.  The  remainder of his review of systems is negative except for HPI.   CURRENT MEDICATIONS:  1. Carvedilol 25 b.i.d.  2. Niaspan 1500.  3. Lasix 20 a day.  4. Coumadin.  5. Ramipril 10 a day.  6. Omeprazole 20 a day.  7. Crestor 40 a day.  8. Amitriptyline 50 a day.  9. Fish oil.  10.Aspirin 81.  11.Multivitamin.  12.Ambien CR 12.5 a day.   PHYSICAL EXAMINATION:  He is well-appearing in no acute distress. He  ambulates around the clinic without  any respiratory difficulty.  Blood  pressure is 115/70, heart rate 82, weights 216 which is down 4 pounds.  HEENT:  Normal.  NECK:  Supple.  No JVD.  Carotids are 2+ bilaterally without bruits.  There is no lymphadenopathy or thyromegaly.  CARDIAC:  PMI is nondisplaced.  Regular rate and rhythm.  No murmurs or  rubs. There is a soft S4.  He has a very prominent sternotomy scar with  keloid.  LUNGS:  Clear.  ABDOMEN:  Obese, nontender, nondistended, no hepatosplenomegaly, no  bruits, no masses.  Good bowel sounds.  EXTREMITIES:  Warm with no cyanosis or clubbing.  There is trace edema  bilaterally.  No rash.  NEURO:  Alert and oriented x3.  Cranial nerves II-XII are intact.  Moves  all four extremities without difficulty.  Affect is pleasant.   EKG shows sinus rhythm with left atrial enlargement and previous  anterior infarct.  No acute ST-T wave changes.  There is  a T-wave and  nonspecific ST changes in the high lateral leads, this is unchanged.   ASSESSMENT/PLAN:  1. Coronary artery disease status post previous bypass surgery. He is      doing well.  No evidence of ischemia.  Continue current therapy.  2. Hypertension well-controlled.  3. Congestive heart failure secondary to ischemic cardiomyopathy, [place]      [place] Heart Association class II. His volume status looks okay.      Continue current therapy. Can increase Lasix on a sliding scale as      needed.  4. Hyperlipidemia.  This is followed by Dr. Drue Novel.  Goal LDL is less      than 70.  5. New onset diabetes. I have explained to him the importance of      routine exercise and weight loss.  He will also follow with Dr.      Drue Novel.  6. Carotid stenosis. He will need follow ultrasounds in 1 year.   DISPOSITION:  Will see him back for routine follow-up in 6 months.  Will  get a carotid ultrasound and a 2-D echo.     Bevelyn Buckles. Bensimhon, MD  Electronically Signed    DRB/MedQ  DD: 02/06/2008  DT: 02/06/2008  Job #: [id number]   cc:    Willow Ora, MD  Dyke Maes, M.D.

## 2011-02-07 NOTE — Assessment & Plan Note (Signed)
HEALTHCARE                            CARDIOLOGY OFFICE NOTE   HAGOP, MCCOLLAM                         MRN:          [id number]  DATE:04/23/2007                            DOB:          07/23/1941    PRIMARY CARE PHYSICIAN:  Willow Ora, MD.   INTERVAL HISTORY:  [name] is a very pleasant 70 year old male with a  history of coronary artery disease, status post previous myocardial  infarction, as well as redo coronary bypass grafting in 2003.  He also  has congestive heart failure related to an ischemic cardiomyopathy with  an EF of 32% by Myoview and 40% by echocardiogram.  He is status post  ICD.  The remainder of his medical history is notable for a history of  pulmonary emboli, hypertension, hyperlipidemia, and chronic  dizziness/headaches, which has been recently evaluated by Dr. Nigel Berthold at  [facility].   He returns today for routine followup.  From a cardiac perspective he is  doing quite well.  He denies chest pain or any significant shortness of  breath.  He does get a little dyspneic at the end of the day or with  moderate-to-vigorous activities such as walking up a flight of stairs  briskly.  He also notes that he occasionally gets some right shoulder  pain if he walks too fast, but this resolves instantly.  He has not had  any prolonged episodes.  This is unchanged.   He did see Dr. Nigel Berthold who was unable to come up with a cause for his  dizziness.  His symptoms have been well controlled with Naprosyn and  amitriptyline.   CURRENT MEDICATIONS:  1. Ambien CR 12.5 a day.  2. Multivitamin.  3. Aspirin 81.  4. Coreg 18.75 b.i.d.  5. Fish oil.  6. Amitriptyline 50 a day.  7. Naproxen 250 b.i.d.  8. Crestor 40 a day.  9. Prilosec 20 a day.  10.Lasix 20 once a day.  11.[name].  12.Ramipril 10 a day.  13.Coumadin.   PHYSICAL EXAMINATION:  He is well-appearing in no acute distress.  Ambulates in the clinic without any respiratory difficulty.   Blood  pressure is 118/68; heart rate is 88; weight is 220.  HEENT:  Normal.  NECK:  Supple.  No JVD.  Carotids are 2+ bilaterally without any bruits.  There is no lymphadenopathy or thyromegaly.  CARDIAC:  He has a large scar with some keloid from previous bypass.  PMI is just minimally displaced.  Normal S1 S2.  No murmur.  Plus S4.  LUNGS:  Clear.  ABDOMEN:  Obese, nontender, nondistended, no hepatosplenomegaly, no  bruits, no masses.  Good bowel sounds.  EXTREMITIES:  Warm with no cyanosis or clubbing.  There is a trace edema  bilaterally.  No rash.  NEUROLOGIC:  Alert and oriented x3.  Cranial nerves II-XII grossly  intact.  Moves all four extremities without difficulty.  Affect is  pleasant.   ASSESSMENT AND PLAN:  1. Coronary artery disease, this is quite stable.  He does have some      exertional right shoulder pain, which  I am unsure if it is ischemia      or not.  Regardless, this is stable; and he is on a good medical      regimen.  He did have some very minimal  peri-infarct ischemia on      previous Myoview.  2. Congestive heart failure secondary to ischemic cardiomyopathy.  He      continues as a NYHA Class II.  Will increase his Coreg to 25 b.i.d.      We have been slightly reluctant to do this in the past, as we did      not want to stimulate RV pacing.  If he does get a significant      burden of the RV pacing, we may need to consider going up to a BiV      device.  3. Hypertension, well controlled.  4. Hyperlipidemia.  LDL is well controlled at 71; HDL remains low at      34.  We will start him on Niaspan 500 mg a day.  I have to him      about the flushing.  5. Carotid artery stenosis.  This is mild, just need routine follow      up.   DISPOSITION:  Return to clinic in several months for routine followup.     Bevelyn Buckles. Bensimhon, MD  Electronically Signed    DRB/MedQ  DD: 04/23/2007  DT: 04/24/2007  Job #: [id number]

## 2011-02-07 NOTE — Assessment & Plan Note (Signed)
[facility]                            CARDIOLOGY OFFICE NOTE   ELIGE, SHOUSE                         MRN:          [id number]  DATE:08/21/2008                            DOB:          [birth year]    PRIMARY CARE PHYSICIAN:  Joseph Ora, MD   NEPHROLOGIST:  Joseph Maes, MD   INTERVAL HISTORY:  [name] is a very pleasant 70 year old male with a  history of coronary artery disease, status post previous myocardial  infarction with redo bypass surgery in 2003.  He also has a congestive  heart failure related to ischemic cardiomyopathy, EF was previously  about 40% range, but most recently noted to be 50%.  He has status post  single chamber ICD.  Remainder of medical history is notable for history  of pulmonary emboli, now on chronic Coumadin, hypertension,  hyperlipidemia, chronic renal insufficiency, and chronic dizziness.  He  also has recently diagnosed diabetes.   He returns today for routine followup.  Overall, he states he is doing  so-so.  He has had a mild increase in his dyspnea on exertion, and  sometimes his upper back and neck feel tired, this is the sort of  symptoms he had with his previous angina.  However, he does think it is  severe.  He denies any orthopnea.  No PND.  No lower extremity edema.  He does have chronic soreness at the sternotomy site.   MEDICATIONS:  1. Carvedilol 25 b.i.d.  2. Niaspan 1500 a day.  3. Lasix 20 mg a day.  4. Amitriptyline.  5. Ambien.  6. Multivitamin.  7. Aspirin 81 a day.  8. Fish oil.  9. Crestor 40 a day.  10.Omeprazole 20 b.i.d.  11.Ramipril 10 daily.  12.Coumadin.   PHYSICAL EXAMINATION:  GENERAL:  He is well appearing in no acute  distress, ambulates around the clinic, without respiratory difficulty.  VITAL SIGNS:  Blood pressure is 12/68, heart rate 77, and weight is 221,  which is up 5 pounds.  HEENT:  Normal.  NECK:  Supple.  No JVD.  Carotids are 2+ bilaterally without  any bruits.  There is no lymphadenopathy or thyromegaly.  CARDIAC:  PMI is nondisplaced.  Regular rate and rhythm.  No murmurs or  rubs.  There is a soft S4.  There is a prominent sternotomy scar with  keloid.  LUNGS:  Clear.  ABDOMEN:  Obese, nontender, and nondistended.  No hepatosplenomegaly.  No bruits.  No masses.  Good bowel sounds.  EXTREMITIES:  Warm with no cyanosis, clubbing, or edema.  NEURO:  Alert and oriented x3.  Cranial nerves II-XII are intact.  Moves  all 4 extremities without difficulty.  Affect is pleasant.   EKG:  Normal sinus rhythm with incomplete left bundle-branch block and a  first-degree AV block.  No significant ST-T wave abnormalities, and  questioned septal Q-waves.   ASSESSMENT AND PLAN:  1. Coronary artery disease, status post previous bypass surgery.  He      is now complaining of slight increase in his dyspnea on exertion.  I am not sure what to make of this.  I think given his history, it      is very reasonable to  pursue adenosine Myoview to further      evaluate.  2. Hypertension, well controlled.  3. Congestive heart failure, secondary to ischemic cardiomyopathy.      Volume status looks okay.  He is New York Heart Association class      II-III.  4. Hyperlipidemia.  This was previously followed by Dr. Drue Hamilton, but he      has not seen him in about a year.  We will get a lipid panel.  Goal      LDL is less than 70.  5. Carotid stenoses.  Recent ultrasound shows 0-39% bilaterally, which      is minimal.  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   DISPOSITION:  We will see him back in several months.  We will await  results of his Myoview.     Joseph Buckles. Bensimhon, MD  Electronically Signed    DRB/MedQ  DD: 08/21/2008  DT: 08/21/2008  Job #: [id number]

## 2011-02-07 NOTE — Assessment & Plan Note (Signed)
No evidence of ischemia. Continue current regimen.   

## 2011-02-09 ENCOUNTER — Other Ambulatory Visit: Payer: Self-pay | Admitting: Internal Medicine

## 2011-02-10 NOTE — Telephone Encounter (Signed)
Are you the prescribing doctor?

## 2011-02-10 NOTE — Telephone Encounter (Signed)
appt scheduled 02/28/11

## 2011-02-10 NOTE — Assessment & Plan Note (Signed)
[facility]                           ELECTROPHYSIOLOGY OFFICE NOTE   Joseph, Hamilton                         MRN:          [id number]  DATE:04/03/2006                            DOB:          Jun 09, 1941    REFERRING PHYSICIAN:  Duke Salvia   [name] is seen following ICD implantation in [place], [place], for  primary prevention in the setting of ischemic heart disease.  He has had no  intercurrent episodes.  He listed a litany of complaints that are being  evaluated by neurology, dental, etc., as he tries to understand why he has  headaches, back, neck aches and toothaches.   PHYSICAL EXAMINATION:  VITAL SIGNS:  On examination, his blood pressure is  141/79, pulse of 62.  LUNGS:  Clear.  CARDIAC:  Heart sounds were regular.   Interrogation of his Guidant defibrillator demonstrates a P wave of 3.0, an  impedance of 495 and a threshold of 0.6 at 0.5.  The R wave is greater than  25 with an impedance of 693 and a threshold of 0.8 at 0.5.   Battery voltage was 3.21.   IMPRESSION:  1.  Ischemic heart disease.  2.  Status post implantable cardioverter-defibrillator for primary      prevention.  3.  [name] is stable.  He will be begun on __________ and we will see him      again in 12 months' time.                                   Duke Salvia, MD, [facility]   SCK/MedQ  DD:  04/03/2006  DT:  04/03/2006  Job #:  [id number]

## 2011-02-10 NOTE — Telephone Encounter (Signed)
Okay one month supply and 1 RF. Patient is due for a physical. Please arrange if not to schedule already

## 2011-02-10 NOTE — Assessment & Plan Note (Signed)
[facility]                            CARDIOLOGY OFFICE NOTE   Joseph Hamilton, Hamilton                         MRN:          [id number]  DATE:08/20/2006                            DOB:          11-18-1940    PRIMARY CARE PHYSICIAN:  Willow Ora, M.D.   PATIENT IDENTIFICATION:  Joseph Hamilton Hamilton is a 70 year old male who returns for  routine follow-up.   PROBLEM LIST:  1. Coronary artery disease.      a.     Status post myocardial infarction x2.      b.     Redo coronary artery bypass grafting 2003.      c.     Stress test February 2005;  ejection fraction of 36%,       minimal reversibility in the inferior wall.  2. Congestive heart failure secondary to ischemic cardiomyopathy.      a.     Ejection fraction 36% by Myoview, 45% by echocardiogram in       February 2006.      b.     Status post Guidant implantable cardioverter defibrillator.  3. History of pulmonary emboli in February 2005, maintained on      Coumadin.  4. Chronic dizziness and headaches.  5. Hypertension.  6. Hyperlipidemia.  7. Sleep apnea, refusing treatment.  8. Obesity.  9. Dupuytren contractures.  10.Osteoarthritis.   CURRENT MEDICATIONS:  1. Coumadin as directed.  2. Altace 5 mg a day.  3. Lasix 20 mg a day.  4. Aspirin 81 a day.  5. Coreg 12.5 b.i.d.  6. Crestor 30 a day.  7. Fish oil.  8. Flonase.  9. Amitriptyline 50 q.h.s.  10.Naprosyn 250 b.i.d.  11.Ambien p.r.n.   INTERVAL HISTORY:  Joseph Hamilton Hamilton returns today for routine follow-up.  He  has had quite an extensive workup of his headaches and his dizziness.  He was seen by neurology and then referred to ENT as well as the Pain  Clinic.  Apparently his workup so far has been unremarkable.  He has  been started on amitriptyline and Naprosyn with some minor release.  Dizziness has now decreased as well as his headaches.  From a cardiac  point of view, he is doing quite well.  He does get short of breath on  moderate to heavy  exertion but otherwise denies any significant heart  failure symptoms.  Has not had any significant chest pain, although he  does have episodes of right shoulder pain.  He also tells me that he  snores quite heavily and his whole family has sleep apnea but he has  treated this by keeping the fan on himself and taking Ambien.  He is  adamantly against workup for sleep apnea or wearing the mask saying he  is claustrophobic.  He does have some mild lower extremity edema on  occasion which he manages with Lasix.   PHYSICAL EXAMINATION:  He is well-appearing in no acute distress.  Ambulatory in the clinic without any respiratory difficulty.  Blood  pressure is 100/70, heart rate is 62.  His weight is  218.  HEENT:  Sclerae anicteric.  EOMI.  There are scattered xanthelasma.  Mucus membranes are moist.  Oropharynx is clear.  Neck is supple.  There  is no obvious JVD.  Carotids are 2+ bilaterally without any bruits.  There is no lymphadenopathy or thyromegaly.  CARDIAC:  He is bradycardic and regular with an S4.  There is no  significant murmur or S3.  LUNGS:  Clear.  ABDOMEN:  Obese with a large ventral hernia.  He has a significant  amount of keloid on his chest scar.  His abdomen is nontender,  nondistended.  No hepatosplenomegaly, no bruits, no masses appreciated.  He has good bowel sounds.  EXTREMITIES:  Warm with no cyanosis or clubbing.  He has trace to 1+  edema left greater than right.  NEUROLOGICAL:  He is alert and oriented x3.  Cranial nerves II through  XII are grossly intact.  Affect is bright.   ASSESSMENT:  1. Coronary artery disease:  This is stable.  I do not think his      shoulder pain represents ischemia; however, I do think it is      reasonable to pursue a Myoview to rule this out.  We will continue      his current therapy.  2. Congestive heart failure secondary to ischemic cardiomyopathy:  He      is doing well.  Currently New York Heart Association class 2.  I       will not titrate his Coreg any further as I am afraid this will      lead to right ventricular pacing.  We will increase his Altace to      7.5 once a day.  3. Hyperlipidemia:  This is followed by Dr. Drue Novel.  We need to be very      aggressive and keep his LDL below 70.  4. Hypertension:  This is well controlled.  5. Obstructive sleep apnea:  Currently refusing workup or treatment,      though I have clearly stressed the risks especially the      cardiovascular risks to him.  6. Dizziness as per neurology and ears/nose/throat:  I did mention to      him that if at all possible that I would like to decrease his dose      of Naprosyn.  This is not good for his fluid, blood pressure or      kidneys or gastrointestinal tract.  He will mention this to the      Pain Clinic.   DISPOSITION:  He will follow up in six months, sooner if he has any  abnormalities on his tests.    Bevelyn Buckles. Bensimhon, MD  Electronically Signed   DRB/MedQ  DD: 08/20/2006  DT: 08/21/2006  Job #: [id number]   cc:   Willow Ora, MD

## 2011-02-21 ENCOUNTER — Other Ambulatory Visit: Payer: Self-pay | Admitting: Internal Medicine

## 2011-02-21 MED ORDER — RAMIPRIL 10 MG PO CAPS: 10.0000 mg | ORAL_CAPSULE | Freq: Every day | ORAL | Status: DC

## 2011-02-28 ENCOUNTER — Ambulatory Visit (INDEPENDENT_AMBULATORY_CARE_PROVIDER_SITE_OTHER): Payer: Medicare Other | Admitting: *Deleted

## 2011-02-28 DIAGNOSIS — Z7901 Long term (current) use of anticoagulants: Secondary | ICD-10-CM

## 2011-02-28 NOTE — Patient Instructions (Addendum)
Continue same medicines, advise patient to take the lower coumadin dose on the same days every week. Next INR----- 4 weeks  Pt is aware, will call for appt.

## 2011-03-01 ENCOUNTER — Other Ambulatory Visit: Payer: Self-pay | Admitting: Internal Medicine

## 2011-03-13 ENCOUNTER — Other Ambulatory Visit: Payer: Self-pay | Admitting: Internal Medicine

## 2011-03-14 ENCOUNTER — Other Ambulatory Visit: Payer: Self-pay | Admitting: *Deleted

## 2011-03-14 MED ORDER — OMEPRAZOLE 20 MG PO CPDR: 20.0000 mg | DELAYED_RELEASE_CAPSULE | Freq: Two times a day (BID) | ORAL | Status: DC

## 2011-04-09 ENCOUNTER — Telehealth: Payer: Self-pay | Admitting: Internal Medicine

## 2011-04-09 NOTE — Telephone Encounter (Signed)
Advise patient: Due inr  

## 2011-04-10 NOTE — Telephone Encounter (Signed)
Pt has appt 04/11/11

## 2011-04-11 ENCOUNTER — Ambulatory Visit (INDEPENDENT_AMBULATORY_CARE_PROVIDER_SITE_OTHER): Payer: Medicare Other | Admitting: Internal Medicine

## 2011-04-11 ENCOUNTER — Encounter: Payer: Self-pay | Admitting: Internal Medicine

## 2011-04-11 ENCOUNTER — Ambulatory Visit (INDEPENDENT_AMBULATORY_CARE_PROVIDER_SITE_OTHER): Payer: Medicare Other | Admitting: *Deleted

## 2011-04-11 DIAGNOSIS — Z95810 Presence of automatic (implantable) cardiac defibrillator: Secondary | ICD-10-CM

## 2011-04-11 DIAGNOSIS — I428 Other cardiomyopathies: Secondary | ICD-10-CM

## 2011-04-11 DIAGNOSIS — Z7901 Long term (current) use of anticoagulants: Secondary | ICD-10-CM

## 2011-04-11 DIAGNOSIS — I25810 Atherosclerosis of coronary artery bypass graft(s) without angina pectoris: Secondary | ICD-10-CM

## 2011-04-11 DIAGNOSIS — I5022 Chronic systolic (congestive) heart failure: Secondary | ICD-10-CM

## 2011-04-11 DIAGNOSIS — I2699 Other pulmonary embolism without acute cor pulmonale: Secondary | ICD-10-CM

## 2011-04-11 LAB — ICD DEVICE OBSERVATION
AL AMPLITUDE: 4.6 mv
AL THRESHOLD: 0.4 V
AL THRESHOLD: 0.8 V
ATRIAL PACING ICD: 0 pct
BAMS-0001: 170 {beats}/min
BAMS-0002: 8 ms
DEVICE MODEL ICD: 102669
FVT: 0
HV IMPEDENCE: 51 Ohm
LV LEAD IMPEDENCE ICD: 0 Ohm
RV LEAD IMPEDENCE ICD: 746 Ohm
RV LEAD THRESHOLD: 0.8 V
RV LEAD THRESHOLD: 1 V
RV LEAD THRESHOLD: 1 V
TOT-0001: 4
TOT-0006: 20110628000000
VF: 0

## 2011-04-11 NOTE — Assessment & Plan Note (Signed)
The patient's device was interrogated.  The information was reviewed. No changes were made in the programming.   He has a status MOL2

## 2011-04-11 NOTE — Assessment & Plan Note (Signed)
Stable

## 2011-04-11 NOTE — Patient Instructions (Signed)
Remote monitoring is used to monitor your Pacemaker of ICD from home. This monitoring reduces the number of office visits required to check your device to one time per year. It allows [place] to keep an eye on the functioning of your device to ensure it is working properly. You are scheduled for a device check from home on 07/13/11. You may send your transmission at any time that day. If you have a wireless device, the transmission will be sent automatically. After your physician reviews your transmission, you will receive a postcard with your next transmission date.  Your physician wants you to follow-up in: 1 year. You will receive a reminder letter in the mail two months in advance. If you don't receive a letter, please call our office to schedule the follow-up appointment.

## 2011-04-11 NOTE — Progress Notes (Signed)
HPI  Joseph Hamilton is a 70 y.o. male Seen in followup for ischemic heart disease with a previously implanted ICD for primary prevention.   He has a history of coronary artery disease, status post previous myocardial infarction, as well as redo coronary bypass grafting in 2003. He also has congestive heart failure related to an ischemic cardiomyopathy with an EF of 28% by Myoview 12/09 and 50% by echocardiogram  The patient denies chest pain, shortness of breath, nocturnal dyspnea, orthopnea or peripheral edema. He adjusts his lasix as needed for edema  There have been no palpitations, lightheadedness or syncope.       Past Medical History  Diagnosis Date  . Coronary artery disease     s/p MI X 2, REDO CABG 2003, MYOVIEW 12/09 EF 28% , EXTENSIVE ANTERIOR, INFERIOR AND LATERAL INFARCT. VERY MILD ANTERIOR ISCHEMIA  . CHF (congestive heart failure)     SECONDARY TO ISCHEMIC CARDIOMYOPATHY ; EF 28% BY MYOVIEW ; 50% BY ECHO 07/2008; ECHO 09/2009 EF "MILD TO MODERATELY REDUCED"  . Hypertension   . Hyperlipidemia   . Dizziness     CHRONIC; s/p EXTENSIVE EVAL, s/p EVAL @ [facility]; HEADACHE:  s/p EXTENSIVE EVAL; ON ELAVIL  . Sleep apnea 03/31/10    UNABLE TO TOLERATE CPAP; > HOME SLEEP TEST AHI 15.6  . Obesity   . Dupuytren contracture   . Osteoarthritis   . Nephrolithiasis   . Diverticulitis 11/2004    on colonoscopy;  repeat due 2016  . CRI (chronic renal insufficiency)     BASELINE Cr. 1.7 ----DR. MATTINGLY  . Carotid artery occlusion     0-39% BILATERALLY W/CHRONICALLY OCCLUDED L VERTEBRAL    Past Surgical History  Procedure Date  . Insert / replace / remove pacemaker 2006    s/p GUIDANT IMPLANTABLE CARDIVERTER DEFIBRILLATOR  . Coronary artery bypass graft 1994    1994 AND 2003  . Cataract extraction   . Tonsillectomy     AS A CHILD    Current Outpatient Prescriptions  Medication Sig Dispense Refill  . amitriptyline (ELAVIL) 25 MG tablet Take 25 mg by mouth at bedtime.          Marland Kitchen aspirin 81 MG EC tablet Take 81 mg by mouth daily.        . carvedilol (COREG) 25 MG tablet Take 1/2 tab by mouth Twice daily   60 tablet  11  . CRESTOR 40 MG tablet TAKE ONE (1) TABLET EACH DAY  30 tablet  1  . furosemide (LASIX) 40 MG tablet Take 40 mg by mouth daily.        Marland Kitchen [name] 2 MG tablet TAKE AS DIRECTED  30 tablet  2  . nateglinide (STARLIX) 60 MG tablet Take 60 mg by mouth 2 (two) times daily before a meal.        . niacin (NIASPAN) 1000 MG CR tablet Take 1,000 mg by mouth at bedtime.        . niacin (NIASPAN) 500 MG CR tablet Take 500 mg by mouth at bedtime.        Marland Kitchen omeprazole (PRILOSEC) 20 MG capsule Take 1 capsule (20 mg total) by mouth 2 (two) times daily.  60 capsule  1  . ramipril (ALTACE) 10 MG capsule Take 1 capsule (10 mg total) by mouth daily.  30 capsule  5  . spironolactone (ALDACTONE) 25 MG tablet TAKE 1/2 TABLET EVERY DAY  30 tablet  4  . zolpidem (AMBIEN CR) 12.5 MG CR tablet  Take 12.5 mg by mouth at bedtime as needed.          Allergies  Allergen Reactions  . Ezetimibe-Simvastatin     REACTION: myalgia: TOLERATES PRAVOCHOL OK    Review of Systems negative except from HPI and PMH  Physical Exam Well developed and well nourished in no acute distress HENT normal E scleral and icterus clear Neck Supple JVP flat; carotids brisk and full Large keloid on chest Clear to ausculation Regular rate and rhythm, no murmurs gallops or rub Soft with active bowel sounds No clubbing cyanosis and edema Alert and oriented, grossly normal motor and sensory function Skin Warm and Dry   Assessment and  Plan

## 2011-04-11 NOTE — Assessment & Plan Note (Signed)
stble

## 2011-04-13 NOTE — Patient Instructions (Signed)
See nurse's note: Patient not taking medications as prescribed, I asked my nurse to instruct patient to take medicines as prescribed. Chemira, please co-sign above instructions [facility]

## 2011-04-25 ENCOUNTER — Other Ambulatory Visit: Payer: Self-pay | Admitting: Internal Medicine

## 2011-05-12 ENCOUNTER — Other Ambulatory Visit: Payer: Self-pay | Admitting: Internal Medicine

## 2011-05-12 NOTE — Telephone Encounter (Signed)
Ok 30, 2 RF 

## 2011-05-15 NOTE — Telephone Encounter (Signed)
Rx Done . 

## 2011-05-16 ENCOUNTER — Ambulatory Visit: Payer: Medicare Other

## 2011-05-17 ENCOUNTER — Ambulatory Visit (INDEPENDENT_AMBULATORY_CARE_PROVIDER_SITE_OTHER): Payer: Medicare Other | Admitting: *Deleted

## 2011-05-17 DIAGNOSIS — Z7901 Long term (current) use of anticoagulants: Secondary | ICD-10-CM

## 2011-05-17 DIAGNOSIS — I2699 Other pulmonary embolism without acute cor pulmonale: Secondary | ICD-10-CM

## 2011-05-17 LAB — POCT INR: INR: 1.7

## 2011-05-17 NOTE — Patient Instructions (Addendum)
Dr. Drue Novel to review for instructions. Doristine Devoid  New: 3mg  tab: 1.5 tabs a day except M-W-F 2 tabs ( 24mg /week) inr in 2 weeks  Joseph Hamilton   Pt aware of instructions also given written copy Doristine Devoid

## 2011-05-30 ENCOUNTER — Ambulatory Visit (INDEPENDENT_AMBULATORY_CARE_PROVIDER_SITE_OTHER): Payer: Medicare Other | Admitting: *Deleted

## 2011-05-30 VITALS — BP 108/64 | Temp 97.7°F | Wt 222.2 lb

## 2011-05-30 DIAGNOSIS — Z7901 Long term (current) use of anticoagulants: Secondary | ICD-10-CM

## 2011-05-30 DIAGNOSIS — I2699 Other pulmonary embolism without acute cor pulmonale: Secondary | ICD-10-CM

## 2011-05-30 NOTE — Patient Instructions (Addendum)
No change. Recheck in 3 weeks.  Left message on pt's home number [phone] to notify, per pt request  ------------------------ Agree, no change , recheck 3 weeks  [facility]

## 2011-06-05 ENCOUNTER — Other Ambulatory Visit: Payer: Self-pay | Admitting: Internal Medicine

## 2011-06-07 NOTE — Telephone Encounter (Signed)
OV on 09/06/10 pt was instructed to schedule CPE in 3 months. Last filled 11/08/10

## 2011-06-07 NOTE — Telephone Encounter (Signed)
Ok 30 and 5 RF 

## 2011-06-08 ENCOUNTER — Other Ambulatory Visit: Payer: Self-pay | Admitting: Internal Medicine

## 2011-06-08 MED ORDER — ZOLPIDEM TARTRATE ER 12.5 MG PO TBCR: 12.5000 mg | EXTENDED_RELEASE_TABLET | Freq: Every evening | ORAL | Status: DC | PRN

## 2011-06-08 NOTE — Telephone Encounter (Signed)
Pt is taking 2 mg tablets

## 2011-06-08 NOTE — Telephone Encounter (Signed)
Before you RF , Coumadin  please clarify dose: is he taking 2 mg tablets or 3 mg tablets ??. Let me know

## 2011-06-09 NOTE — Telephone Encounter (Signed)
Ok, 2 mg tablet, call 1 month supply and 1 RF

## 2011-06-26 ENCOUNTER — Other Ambulatory Visit: Payer: Self-pay | Admitting: Internal Medicine

## 2011-06-27 ENCOUNTER — Encounter: Payer: Self-pay | Admitting: *Deleted

## 2011-06-27 ENCOUNTER — Ambulatory Visit (INDEPENDENT_AMBULATORY_CARE_PROVIDER_SITE_OTHER): Payer: Medicare Other | Admitting: *Deleted

## 2011-06-27 DIAGNOSIS — I2699 Other pulmonary embolism without acute cor pulmonale: Secondary | ICD-10-CM

## 2011-06-27 DIAGNOSIS — Z7901 Long term (current) use of anticoagulants: Secondary | ICD-10-CM

## 2011-06-27 LAB — POCT INR: INR: 3.3

## 2011-06-27 MED ORDER — WARFARIN SODIUM 2 MG PO TABS: 2.0000 mg | ORAL_TABLET | Freq: Every day | ORAL | Status: DC

## 2011-06-27 MED ORDER — WARFARIN SODIUM 2 MG PO TABS: 2.0000 mg | ORAL_TABLET | ORAL | Status: DC

## 2011-06-27 NOTE — Patient Instructions (Addendum)
INR 3.3 today, current dose is 24mg  /week Plan: Go back to coumadin 2 mg:1.5 tab a day Recheck in 3 weeks

## 2011-06-27 NOTE — Progress Notes (Signed)
  Subjective:    Patient ID: Joseph Hamilton, male    DOB: 01/18/1941, 70 y.o.   MRN: [id number]  HPI Here for a Coumadin check  Past Medical History  Diagnosis Date  . Coronary artery disease     s/p MI X 2, REDO CABG 2003, MYOVIEW 12/09 EF 28% , EXTENSIVE ANTERIOR, INFERIOR AND LATERAL INFARCT. VERY MILD ANTERIOR ISCHEMIA  . CHF (congestive heart failure)     SECONDARY TO ISCHEMIC CARDIOMYOPATHY ; EF 28% BY MYOVIEW ; 50% BY ECHO 07/2008; ECHO 09/2009 EF "MILD TO MODERATELY REDUCED"  . Hypertension   . Hyperlipidemia   . Dizziness     CHRONIC; s/p EXTENSIVE EVAL, s/p EVAL @ [facility]; HEADACHE:  s/p EXTENSIVE EVAL; ON ELAVIL  . Sleep apnea 03/31/10    UNABLE TO TOLERATE CPAP; > HOME SLEEP TEST AHI 15.6  . Obesity   . Dupuytren contracture   . Osteoarthritis   . Nephrolithiasis   . Diverticulitis 11/2004    on colonoscopy;  repeat due 2016  . CRI (chronic renal insufficiency)     BASELINE Cr. 1.7 ----DR. MATTINGLY  . Carotid artery occlusion     0-39% BILATERALLY W/CHRONICALLY OCCLUDED L VERTEBRAL     Review of Systems Doing well, and good compliance, no change on diet    Objective:   Physical Exam  Alert oriented in no apparent distress, vital signs stable     Assessment & Plan:

## 2011-07-08 ENCOUNTER — Other Ambulatory Visit: Payer: Self-pay | Admitting: Internal Medicine

## 2011-07-10 NOTE — Telephone Encounter (Signed)
Done

## 2011-07-13 ENCOUNTER — Ambulatory Visit: Payer: Medicare Other | Admitting: *Deleted

## 2011-07-13 DIAGNOSIS — I2510 Atherosclerotic heart disease of native coronary artery without angina pectoris: Secondary | ICD-10-CM

## 2011-07-17 ENCOUNTER — Encounter: Payer: Self-pay | Admitting: *Deleted

## 2011-07-20 NOTE — Progress Notes (Signed)
Checked in incorrectly.

## 2011-08-01 ENCOUNTER — Encounter: Payer: Self-pay | Admitting: Internal Medicine

## 2011-08-01 ENCOUNTER — Ambulatory Visit (INDEPENDENT_AMBULATORY_CARE_PROVIDER_SITE_OTHER): Payer: Medicare Other | Admitting: Internal Medicine

## 2011-08-01 VITALS — BP 112/62 | HR 93 | Temp 98.8°F | Resp 18 | Ht 70.0 in | Wt 214.0 lb

## 2011-08-01 DIAGNOSIS — E119 Type 2 diabetes mellitus without complications: Secondary | ICD-10-CM

## 2011-08-01 DIAGNOSIS — I2699 Other pulmonary embolism without acute cor pulmonale: Secondary | ICD-10-CM

## 2011-08-01 DIAGNOSIS — Z7901 Long term (current) use of anticoagulants: Secondary | ICD-10-CM

## 2011-08-01 DIAGNOSIS — E785 Hyperlipidemia, unspecified: Secondary | ICD-10-CM

## 2011-08-01 DIAGNOSIS — N259 Disorder resulting from impaired renal tubular function, unspecified: Secondary | ICD-10-CM

## 2011-08-01 LAB — POCT INR: INR: 2.4

## 2011-08-01 NOTE — Progress Notes (Signed)
  Subjective:    Patient ID: Joseph Hamilton, male    DOB: 05-06-1941, 70 y.o.   MRN: [id number]  HPI I recommended him to have a regular check up with me, he states he is not interested on anything except his INR to be rechecked. I told him I'm interested on provided him with primary care , that his Diabetes-Renal function-cholesterol  needs to be checked regularly , risk of uncontrolled medical problems discussed. Stated he is not interested on regular followups with me .   Review of Systems     Objective:   Physical Exam Alert oriented, in no apparent distress.       Assessment & Plan:  INR today 2.4, no change, recheck in 4 weeks.

## 2011-08-01 NOTE — Assessment & Plan Note (Signed)
See HPI, not interested in regular check ups

## 2011-08-01 NOTE — Assessment & Plan Note (Signed)
See HPI, not interested on regular check ups  

## 2011-08-01 NOTE — Assessment & Plan Note (Signed)
See HPI, not interested on regular check ups

## 2011-08-02 ENCOUNTER — Other Ambulatory Visit: Payer: Self-pay | Admitting: Internal Medicine

## 2011-08-02 NOTE — Telephone Encounter (Signed)
Medication refill done

## 2011-09-04 ENCOUNTER — Other Ambulatory Visit: Payer: Self-pay | Admitting: Internal Medicine

## 2011-09-12 ENCOUNTER — Ambulatory Visit: Payer: Medicare Other

## 2011-09-12 ENCOUNTER — Ambulatory Visit (INDEPENDENT_AMBULATORY_CARE_PROVIDER_SITE_OTHER): Payer: Medicare Other | Admitting: *Deleted

## 2011-09-12 ENCOUNTER — Encounter: Payer: Self-pay | Admitting: *Deleted

## 2011-09-12 VITALS — BP 125/75 | HR 86 | Temp 97.5°F | Ht 69.0 in | Wt 217.0 lb

## 2011-09-12 DIAGNOSIS — Z7901 Long term (current) use of anticoagulants: Secondary | ICD-10-CM

## 2011-09-12 NOTE — Patient Instructions (Signed)
No changes to medication dosage Continue to take 2mg  tab: 1.5 tabs a day  Re-check PT/INR in one month

## 2011-09-16 ENCOUNTER — Other Ambulatory Visit: Payer: Self-pay | Admitting: Internal Medicine

## 2011-09-26 DIAGNOSIS — I82409 Acute embolism and thrombosis of unspecified deep veins of unspecified lower extremity: Secondary | ICD-10-CM

## 2011-09-26 HISTORY — DX: Acute embolism and thrombosis of unspecified deep veins of unspecified lower extremity: I82.409

## 2011-10-04 ENCOUNTER — Other Ambulatory Visit: Payer: Self-pay | Admitting: Internal Medicine

## 2011-10-12 ENCOUNTER — Ambulatory Visit (INDEPENDENT_AMBULATORY_CARE_PROVIDER_SITE_OTHER): Payer: Medicare Other | Admitting: *Deleted

## 2011-10-12 ENCOUNTER — Other Ambulatory Visit: Payer: Self-pay | Admitting: Internal Medicine

## 2011-10-12 ENCOUNTER — Encounter: Payer: Self-pay | Admitting: Internal Medicine

## 2011-10-12 DIAGNOSIS — Z95810 Presence of automatic (implantable) cardiac defibrillator: Secondary | ICD-10-CM

## 2011-10-12 DIAGNOSIS — I428 Other cardiomyopathies: Secondary | ICD-10-CM

## 2011-10-20 ENCOUNTER — Ambulatory Visit: Payer: Medicare Other | Admitting: Internal Medicine

## 2011-10-23 ENCOUNTER — Telehealth: Payer: Self-pay | Admitting: Internal Medicine

## 2011-10-23 ENCOUNTER — Encounter: Payer: Self-pay | Admitting: Internal Medicine

## 2011-10-23 ENCOUNTER — Ambulatory Visit (INDEPENDENT_AMBULATORY_CARE_PROVIDER_SITE_OTHER): Payer: Medicare Other | Admitting: Internal Medicine

## 2011-10-23 DIAGNOSIS — E119 Type 2 diabetes mellitus without complications: Secondary | ICD-10-CM

## 2011-10-23 DIAGNOSIS — R238 Other skin changes: Secondary | ICD-10-CM

## 2011-10-23 DIAGNOSIS — E785 Hyperlipidemia, unspecified: Secondary | ICD-10-CM

## 2011-10-23 DIAGNOSIS — I2699 Other pulmonary embolism without acute cor pulmonale: Secondary | ICD-10-CM

## 2011-10-23 DIAGNOSIS — L989 Disorder of the skin and subcutaneous tissue, unspecified: Secondary | ICD-10-CM

## 2011-10-23 LAB — CBC
Hemoglobin: 13.9 g/dL (ref 13.0–17.0)
MCH: 32.9 pg (ref 26.0–34.0)
RBC: 4.23 MIL/uL (ref 4.22–5.81)

## 2011-10-23 NOTE — Telephone Encounter (Signed)
Ok to stop. Recent study showed there was no benefit in reducing heart attack or stroke.

## 2011-10-23 NOTE — Telephone Encounter (Signed)
New Msg: Pt calling stating that pt is transferring PCP and pt wanted to discuss with MD about pt temporarily stopping niaspan. Pt thinks niaspan might be the cause of pt itching.   Please return pt call to discuss further.

## 2011-10-23 NOTE — Telephone Encounter (Signed)
Pt in process of switching pcps.  His new pcp, Dr Rodena Medin, is thinking that the niaspan might be causing itching on his entire body.  He wants to know if it is ok to stop the niaspan to see if it will stop.

## 2011-10-24 ENCOUNTER — Ambulatory Visit (INDEPENDENT_AMBULATORY_CARE_PROVIDER_SITE_OTHER): Payer: Medicare Other | Admitting: *Deleted

## 2011-10-24 DIAGNOSIS — Z7901 Long term (current) use of anticoagulants: Secondary | ICD-10-CM

## 2011-10-24 DIAGNOSIS — I2699 Other pulmonary embolism without acute cor pulmonale: Secondary | ICD-10-CM

## 2011-10-24 LAB — HEMOGLOBIN A1C: Mean Plasma Glucose: 160 mg/dL — ABNORMAL HIGH (ref <117)

## 2011-10-24 LAB — HEPATIC FUNCTION PANEL
Indirect Bilirubin: 0.4 mg/dL (ref 0.0–0.9)
Total Bilirubin: 0.5 mg/dL (ref 0.3–1.2)
Total Protein: 7.4 g/dL (ref 6.0–8.3)

## 2011-10-24 LAB — POCT INR: INR: 2.2

## 2011-10-24 LAB — BASIC METABOLIC PANEL
CO2: 23 mEq/L (ref 19–32)
Calcium: 9.4 mg/dL (ref 8.4–10.5)
Creat: 2.07 mg/dL — ABNORMAL HIGH (ref 0.50–1.35)

## 2011-10-24 NOTE — Telephone Encounter (Signed)
No. Let's check lipid pane in 3 months and can decide then.

## 2011-10-24 NOTE — Telephone Encounter (Signed)
Pt was notified and wants to know if he should increase his crestor since he is stopping the crestor?

## 2011-10-24 NOTE — Telephone Encounter (Signed)
Pt notified, lab ordered and scheduled.  Lab date and time mailed to pt.

## 2011-10-24 NOTE — Patient Instructions (Addendum)
Return to office in 4 weeks  No change continue current dose: 2mg  tab: 1.5 tabs a day   Plan: no change, 4 weeks. JP

## 2011-10-24 NOTE — Telephone Encounter (Signed)
Pt was notified.  

## 2011-10-27 ENCOUNTER — Telehealth: Payer: Self-pay | Admitting: Hematology & Oncology

## 2011-10-27 NOTE — Telephone Encounter (Signed)
Pt aware of 2-25 new patient appointment

## 2011-10-27 NOTE — Telephone Encounter (Signed)
Left pt message to call and schedule appointment °

## 2011-10-31 ENCOUNTER — Telehealth: Payer: Self-pay | Admitting: *Deleted

## 2011-10-31 NOTE — Progress Notes (Signed)
Agree. Take consistently instead of increase

## 2011-10-31 NOTE — Telephone Encounter (Signed)
Spoke with pt and was told that he contacted cardiologist to see if he could stop Niaspan as he thought it was causing him to itch. Pt reports that he was told to stop it as recent information suggests "it is not effective in preventing heart attacks or strokes." Pt wanted you to be aware.

## 2011-11-03 LAB — REMOTE ICD DEVICE
AL AMPLITUDE: 3.3 mv
AL IMPEDENCE ICD: 489 Ohm
ATRIAL PACING ICD: 0 pct
BATTERY VOLTAGE: 2.58 v
CHARGE TIME: 15.4 s
DEV-0020ICD: NEGATIVE
DEVICE MODEL ICD: 102669
FVT: 0
HV IMPEDENCE: 43 Ohm
MODE SWITCH EPISODES: 0
PACEART VT: 0
RV LEAD AMPLITUDE: 25 mv
RV LEAD IMPEDENCE ICD: 703 Ohm
TOT-0006: 20121104000000
VENTRICULAR PACING ICD: 0 pct
VF: 0

## 2011-11-04 ENCOUNTER — Other Ambulatory Visit: Payer: Self-pay | Admitting: Internal Medicine

## 2011-11-04 DIAGNOSIS — R238 Other skin changes: Secondary | ICD-10-CM | POA: Insufficient documentation

## 2011-11-04 NOTE — Assessment & Plan Note (Signed)
Hematology consult  ?

## 2011-11-04 NOTE — Assessment & Plan Note (Signed)
Obtain labs.

## 2011-11-04 NOTE — Assessment & Plan Note (Signed)
Hold niaspan for one to determine if related.

## 2011-11-04 NOTE — Progress Notes (Signed)
  Subjective:    Patient ID: Joseph Hamilton, male    DOB: 20-May-1941, 71 y.o.   MRN: [id number]  HPI Pt presents to clinic for followup of multiple medical problems. Former pt of Dr. Drue Novel. H/o PE several years ago maintained on chronic anticoagulation. No known hypercoagulable state however relates h/o clot in mother. Last inr 12/18 and states is scheduled for inr check tomorrow through [place] office who he indicates continues to follow his anticoagulation. Notes chronic skin irritation he believes may be related to medication. S/p dermatology evaluation without improvement.   Past Medical History  Diagnosis Date  . Coronary artery disease     s/p MI X 2, REDO CABG 2003, MYOVIEW 12/09 EF 28% , EXTENSIVE ANTERIOR, INFERIOR AND LATERAL INFARCT. VERY MILD ANTERIOR ISCHEMIA  . CHF (congestive heart failure)     SECONDARY TO ISCHEMIC CARDIOMYOPATHY ; EF 28% BY MYOVIEW ; 50% BY ECHO 07/2008; ECHO 09/2009 EF "MILD TO MODERATELY REDUCED"  . Hypertension   . Hyperlipidemia   . Dizziness     CHRONIC; s/p EXTENSIVE EVAL, s/p EVAL @ [facility]; HEADACHE:  s/p EXTENSIVE EVAL; ON ELAVIL  . Sleep apnea 03/31/10    UNABLE TO TOLERATE CPAP; > HOME SLEEP TEST AHI 15.6  . Obesity   . Dupuytren contracture   . Osteoarthritis   . Nephrolithiasis   . Diverticulitis 11/2004    on colonoscopy;  repeat due 2016  . CRI (chronic renal insufficiency)     BASELINE Cr. 1.7 ----DR. MATTINGLY  . Carotid artery occlusion     0-39% BILATERALLY W/CHRONICALLY OCCLUDED L VERTEBRAL   Past Surgical History  Procedure Date  . Insert / replace / remove pacemaker 2006    s/p GUIDANT IMPLANTABLE CARDIVERTER DEFIBRILLATOR  . Coronary artery bypass graft 1994    1994 AND 2003  . Cataract extraction   . Tonsillectomy     AS A CHILD    reports that he quit smoking about 18 years ago. His smoking use included Pipe. He has never used smokeless tobacco. He reports that he drinks alcohol. He reports that he does not use illicit  drugs. family history is not on file. Allergies  Allergen Reactions  . Ezetimibe-Simvastatin     REACTION: myalgia: TOLERATES PRAVOCHOL OK      Review of Systems see hpi     Objective:   Physical Exam  Physical Exam  Nursing note and vitals reviewed. Constitutional: Appears well-developed and well-nourished. No distress.  HENT:  Head: Normocephalic and atraumatic.  Right Ear: External ear normal.  Left Ear: External ear normal.  Eyes: Conjunctivae are normal. No scleral icterus.  Neck: Neck supple. Carotid bruit is not present.  Cardiovascular: Normal rate, regular rhythm and normal heart sounds.  Exam reveals no gallop and no friction rub.   No murmur heard. Pulmonary/Chest: Effort normal and breath sounds normal. No respiratory distress. He has no wheezes. no rales.  Lymphadenopathy:    He has no cervical adenopathy.  Neurological:Alert.  Skin: Skin is warm and dry. Not diaphoretic.  Psychiatric: Has a normal mood and affect.        Assessment & Plan:

## 2011-11-06 ENCOUNTER — Other Ambulatory Visit: Payer: Self-pay | Admitting: Internal Medicine

## 2011-11-06 NOTE — Telephone Encounter (Signed)
Refill done.  

## 2011-11-06 NOTE — Telephone Encounter (Signed)
Refill request starlix 60mg  #60. Pt has not been seen within a year. OK to refill?

## 2011-11-09 NOTE — Progress Notes (Signed)
Remote icd check  

## 2011-11-12 NOTE — Telephone Encounter (Signed)
Forward to Dr Rodena Medin, new PCP

## 2011-11-13 NOTE — Telephone Encounter (Signed)
Done

## 2011-11-16 ENCOUNTER — Telehealth: Payer: Self-pay | Admitting: Hematology & Oncology

## 2011-11-16 NOTE — Telephone Encounter (Signed)
Left msg reminding pt of 12/18/11 appt.

## 2011-11-20 ENCOUNTER — Ambulatory Visit (HOSPITAL_BASED_OUTPATIENT_CLINIC_OR_DEPARTMENT_OTHER): Payer: Medicare Other | Admitting: Hematology & Oncology

## 2011-11-20 ENCOUNTER — Other Ambulatory Visit (HOSPITAL_BASED_OUTPATIENT_CLINIC_OR_DEPARTMENT_OTHER): Payer: Medicare Other | Admitting: Lab

## 2011-11-20 ENCOUNTER — Ambulatory Visit: Payer: Medicare Other

## 2011-11-20 VITALS — BP 110/67 | HR 64 | Temp 96.1°F | Ht 69.0 in | Wt 217.0 lb

## 2011-11-20 DIAGNOSIS — I2699 Other pulmonary embolism without acute cor pulmonale: Secondary | ICD-10-CM

## 2011-11-20 LAB — CBC WITH DIFFERENTIAL (CANCER CENTER ONLY)
BASO%: 0.5 % (ref 0.0–2.0)
EOS%: 2.8 % (ref 0.0–7.0)
HGB: 13.8 g/dL (ref 13.0–17.1)
LYMPH#: 1.2 10*3/uL (ref 0.9–3.3)
MCHC: 35.1 g/dL (ref 32.0–35.9)
MONO#: 0.8 10*3/uL (ref 0.1–0.9)
NEUT#: 4 10*3/uL (ref 1.5–6.5)
RDW: 12.9 % (ref 11.1–15.7)
WBC: 6.1 10*3/uL (ref 4.0–10.0)

## 2011-11-20 NOTE — Progress Notes (Signed)
This office note has been dictated.

## 2011-11-20 NOTE — Progress Notes (Signed)
CC:   Joseph Arbour, Joseph Hamilton Joseph Buckles. Bensimhon, Joseph Hamilton  DIAGNOSIS:  Pulmonary embolism-2005.  HISTORY OF PRESENT ILLNESS:  Joseph Hamilton is a very nice 71 year old gentleman.  He is originally from [place].  He has been [place] here in [place] a few years.  He moved from [place], [place].  He lives in many places.  He is with the clergy.  He has a interesting history.  He has history of coronary artery disease.  He did undergo CABG back in 2003.  He then was found to have a pulmonary emboli.  This was in 2005.  He was put on anticoagulation.  He is on Coumadin.  He has been on Coumadin since.  He has not had any issues with respect to bleeding while on Coumadin.  He sees Dr. Rodena Medin.  Dr. Rodena Medin felt that a hematologic referral was indicated to see if he needs continued anticoagulation.  Joseph Hamilton sees Cardiology.  He sees Dr. Johnna Acosta and Dr. Nicholes Mango.  He does have a ICD.  He has not had any problems with cough. It appears to be no chest pain. He has had no change in bowel or bladder habits. Apparently, his last colonoscopy was I think 5 years ago.  He is not sure when his last PSA level was.  He has had no leg swelling.  He has had no weight loss or weight gain. There has been no change in his medications recently.  He does not recall if any hypercoagulable studies were ever done on him to identify any thrombophilic state.  We were kindly asked to see him to see about whether or not he requires continued anticoagulation.  PAST MEDICAL HISTORY:  Remarkable for: 1. Coronary artery disease, status post CABG. 2. Pulmonary embolism. 3. Hypertension. 4. Hyperlipidemia. 5. Hyperglycemia.  ALLERGIES:  Vytorin.  MEDICATIONS:  Elavil 25 mg p.o. daily, aspirin 81 mg p.o. daily, Coreg 25 mg p.o. b.i.d., Crestor 40 mg p.o. daily, Lasix 40 mg p.o. daily, Starlix 60 mg p.o. b.i.d., Prilosec 20 mg p.o. daily, Altace 10 mg p.o. daily, Aldactone 25 mg p.o. daily, warfarin 2  mg p.o. daily, and Ambien CR 12.5 mg p.o. q.h.s.  SOCIAL HISTORY:  Remarkable for past tobacco use.  He stopped several years ago.  He has social alcohol use.  He has no obvious occupational exposures.  FAMILY HISTORY:  Remarkable for his mother who had blood clots, and I think died from an embolic event.  REVIEW OF SYSTEMS:  As stated in history of present illness.  No additional findings are noted on a 12-system review.  PHYSICAL EXAM:  General: This is a well-developed, well-nourished, white gentleman in no obvious distress.  Vital signs: Show a temperature of 96.1, pulse 64, heart rate 18, blood pressure 110/67, and weight is 217. Head and neck exam shows a normocephalic, atraumatic skull.  There are no ocular or oral lesions.  There are no palpable cervical or supraclavicular lymph nodes.  Lungs are clear bilaterally.  Cardiac exam: Regular rate and rhythm with a normal S1, S2.  There are no murmurs, rubs, or bruits.  Abdominal exam: Soft, mildly obese.  He has good bowel sounds.  There is no palpable abdominal mass.  There is no fluid wave.  There is no palpable hepatosplenomegaly.  Back exam: Shows no tenderness over the spine, ribs, or hips.  Extremities: Shows no clubbing, cyanosis, or edema.  He has good range of motion of his joints.  Skin exam:  Shows some marked keloid formation on his chest wall, related to his cardiothoracic procedure.  He has a couple of keloids on his back from the cyst removals. Neurological exam: Shows no focal neurological deficits.  LABORATORY STUDIES:  White cell count 6.1, hemoglobin 13.8, hematocrit 39.3, and platelet count is 154.  MCV is 95.  IMPRESSION:  Joseph Hamilton is a 71 year old gentleman with a history of a pulmonary embolism.  This occurred back in 2005.  I must say that this is not a straightforward case.  There is a family history of thromboembolic event with his mother.  What I really am interested in is the fact that he has  this fairly brisk keloid formation.  No matter where he had surgery, there is a significant keloid that has formed.  I just really have a sense that he is benefitting from the Coumadin. Although he has only had 1 thromboembolic event, I really believe that he is deriving a benefit from Coumadin and really is not having problems with Coumadin.  He has his labs checked every 6 weeks.  He says the dose has not changed recently.  I just wonder if the fact that he is a keloid former is not somewhat indicative of the "irritability" of his vascular endothelium. I am not sure I could prove that be the case, but I just find it more in the question that his mother had the same problems and died from a thromboembolic event.  As such, I would continue Joseph Hamilton on Coumadin for now.  I really do believe that he is deriving a benefit from the Coumadin.  He has been on it now for a good 7 years or so.  He has not had complications.  I do realize that the longer a patient is on Coumadin, the greater chances of a bleeding event.  I believe that this risk would be very low with Joseph Hamilton, he is "with the program" and is proactive.  I talked with Joseph Hamilton for about an hour or so.  I explained to him my rationale, recommendation for continued Coumadin.  He did seem to understand this and had no problems being on Coumadin. I think we could spread his INR checks out to every 2 months for now.  I think every 2 months would be reasonable for him and easy to do.  I want to see Joseph Hamilton back in about 4 months.  I do not see that we have to get him back sooner.  I do not see any need for any x-ray studies on him.  It certainly was a pleasure talking to Joseph Hamilton. He is a very interesting guy. 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    ______________________________ Josph Macho, M.D. PRE/MEDQ  D:  11/20/2011  T:  11/20/2011  Job:   [id number]  ADDENDUM: Hypercoaguable panel is (-) except for low Protein C/S due to coumadin.

## 2011-11-23 LAB — HYPERCOAGULABLE PANEL, COMPREHENSIVE
Anticardiolipin IgG: 5 GPL U/mL (ref <23)
Beta-2 Glyco I IgG: 0 G Units (ref <20)
Beta-2-Glycoprotein I IgA: 8 A Units (ref <20)
Lupus Anticoagulant: NOT DETECTED
Protein C Activity: 19 % — ABNORMAL LOW (ref 75–133)
Protein C, Total: 61 % — ABNORMAL LOW (ref 72–160)
Protein S Total: 50 % — ABNORMAL LOW (ref 60–150)

## 2011-11-23 LAB — PROTHROMBIN TIME
INR: 2.21 — ABNORMAL HIGH (ref <1.50)
Prothrombin Time: 25.3 seconds — ABNORMAL HIGH (ref 11.6–15.2)

## 2011-11-23 LAB — D-DIMER, QUANTITATIVE: D-Dimer, Quant: 0.26 ug/mL-FEU (ref 0.00–0.48)

## 2011-12-05 ENCOUNTER — Ambulatory Visit: Payer: Medicare Other

## 2011-12-07 ENCOUNTER — Ambulatory Visit: Payer: Medicare Other

## 2011-12-07 ENCOUNTER — Ambulatory Visit (INDEPENDENT_AMBULATORY_CARE_PROVIDER_SITE_OTHER): Payer: Medicare Other | Admitting: *Deleted

## 2011-12-07 VITALS — BP 120/68 | HR 66

## 2011-12-07 DIAGNOSIS — I2699 Other pulmonary embolism without acute cor pulmonale: Secondary | ICD-10-CM

## 2011-12-07 NOTE — Patient Instructions (Signed)
Take 1/2 tablet today & take 1.5 tablets daily as usual. Re-check in 4 weeks.

## 2011-12-23 ENCOUNTER — Other Ambulatory Visit: Payer: Self-pay | Admitting: Internal Medicine

## 2011-12-25 NOTE — Telephone Encounter (Signed)
Refill request Ambien CR 12.5mg  #30 with 5 refills. Last refilled on 9.12.12. Ok to refill?

## 2011-12-26 ENCOUNTER — Other Ambulatory Visit: Payer: Self-pay

## 2011-12-26 MED ORDER — SPIRONOLACTONE 25 MG PO TABS: 25.0000 mg | ORAL_TABLET | Freq: Every day | ORAL | Status: DC

## 2012-01-11 ENCOUNTER — Other Ambulatory Visit: Payer: Self-pay | Admitting: *Deleted

## 2012-01-11 MED ORDER — FUROSEMIDE 40 MG PO TABS: 40.0000 mg | ORAL_TABLET | Freq: Every day | ORAL | Status: DC

## 2012-01-11 NOTE — Telephone Encounter (Signed)
Refilled furosemide

## 2012-01-15 ENCOUNTER — Telehealth: Payer: Self-pay | Admitting: Internal Medicine

## 2012-01-15 ENCOUNTER — Encounter: Payer: Self-pay | Admitting: Internal Medicine

## 2012-01-15 ENCOUNTER — Ambulatory Visit (INDEPENDENT_AMBULATORY_CARE_PROVIDER_SITE_OTHER): Payer: Medicare Other | Admitting: Internal Medicine

## 2012-01-15 VITALS — BP 100/72 | HR 57 | Temp 97.8°F | Ht 69.5 in | Wt 219.0 lb

## 2012-01-15 DIAGNOSIS — E119 Type 2 diabetes mellitus without complications: Secondary | ICD-10-CM

## 2012-01-15 DIAGNOSIS — L989 Disorder of the skin and subcutaneous tissue, unspecified: Secondary | ICD-10-CM

## 2012-01-15 DIAGNOSIS — R238 Other skin changes: Secondary | ICD-10-CM

## 2012-01-15 DIAGNOSIS — H9319 Tinnitus, unspecified ear: Secondary | ICD-10-CM

## 2012-01-15 LAB — HEMOGLOBIN A1C
Hgb A1c MFr Bld: 6.9 % — ABNORMAL HIGH (ref <5.7)
Mean Plasma Glucose: 151 mg/dL — ABNORMAL HIGH (ref <117)

## 2012-01-15 LAB — BASIC METABOLIC PANEL
Potassium: 4.7 mEq/L (ref 3.5–5.3)
Sodium: 137 mEq/L (ref 135–145)

## 2012-01-15 MED ORDER — WARFARIN SODIUM 2 MG PO TABS: 2.0000 mg | ORAL_TABLET | Freq: Every day | ORAL | Status: DC

## 2012-01-15 MED ORDER — OMEPRAZOLE 20 MG PO CPDR: 20.0000 mg | DELAYED_RELEASE_CAPSULE | Freq: Every day | ORAL | Status: DC

## 2012-01-15 MED ORDER — ZOLPIDEM TARTRATE ER 12.5 MG PO TBCR: 12.5000 mg | EXTENDED_RELEASE_TABLET | Freq: Every evening | ORAL | Status: DC | PRN

## 2012-01-15 NOTE — Assessment & Plan Note (Signed)
Resolved with dc of niaspan.

## 2012-01-15 NOTE — Progress Notes (Signed)
  Subjective:    Patient ID: Joseph Hamilton, male    DOB: [birth year], 71 y.o.   MRN: [id number]  HPI Pt presents to clinic for followup of multiple medical problems. Off niaspan and skin itching resolved. Not checking fsbs. Last a1c 7.2 taking starlix mainly qd (can't consistently remember more frequent dosing.) continues to see nephrology qyear for CRI. S/o H/O to evaluate continued need for anticoagulation for h/o PE. In process of transitioning to H/O CC. Interested in potentially stopping elavil. Originally began for tinnitus by ENT but also has chronic leg ?paresthesia.   Past Medical History  Diagnosis Date  . Coronary artery disease     s/p MI X 2, REDO CABG 2003, MYOVIEW 12/09 EF 28% , EXTENSIVE ANTERIOR, INFERIOR AND LATERAL INFARCT. VERY MILD ANTERIOR ISCHEMIA  . CHF (congestive heart failure)     SECONDARY TO ISCHEMIC CARDIOMYOPATHY ; EF 28% BY MYOVIEW ; 50% BY ECHO 07/2008; ECHO 09/2009 EF "MILD TO MODERATELY REDUCED"  . Hypertension   . Hyperlipidemia   . Dizziness     CHRONIC; s/p EXTENSIVE EVAL, s/p EVAL @ [facility]; HEADACHE:  s/p EXTENSIVE EVAL; ON ELAVIL  . Sleep apnea 03/31/10    UNABLE TO TOLERATE CPAP; > HOME SLEEP TEST AHI 15.6  . Obesity   . Dupuytren contracture   . Osteoarthritis   . Nephrolithiasis   . Diverticulitis 11/2004    on colonoscopy;  repeat due 2016  . CRI (chronic renal insufficiency)     BASELINE Cr. 1.7 ----DR. MATTINGLY  . Carotid artery occlusion     0-39% BILATERALLY W/CHRONICALLY OCCLUDED L VERTEBRAL   Past Surgical History  Procedure Date  . Insert / replace / remove pacemaker 2006    s/p GUIDANT IMPLANTABLE CARDIVERTER DEFIBRILLATOR  . Coronary artery bypass graft 1994    1994 AND 2003  . Cataract extraction   . Tonsillectomy     AS A CHILD    reports that he quit smoking about 18 years ago. His smoking use included Pipe. He has never used smokeless tobacco. He reports that he drinks alcohol. He reports that he does not use illicit  drugs. family history is not on file. Allergies  Allergen Reactions  . Ezetimibe-Simvastatin     REACTION: myalgia: TOLERATES PRAVOCHOL OK      Review of Systems see hpi     Objective:   Physical Exam  Physical Exam  Nursing note and vitals reviewed. Constitutional: Appears well-developed and well-nourished. No distress.  HENT:  Head: Normocephalic and atraumatic.  Right Ear: External ear normal.  Left Ear: External ear normal.  Eyes: Conjunctivae are normal. No scleral icterus.  Neck: Neck supple. Carotid bruit is not present.  Cardiovascular: Normal rate, regular rhythm and normal heart sounds.  Exam reveals no gallop and no friction rub.   No murmur heard. Pulmonary/Chest: Effort normal and breath sounds normal. No respiratory distress. He has no wheezes. no rales.  Lymphadenopathy:    He has no cervical adenopathy.  Neurological:Alert.  Skin: Skin is warm and dry. Not diaphoretic.  Psychiatric: Has a normal mood and affect.       Assessment & Plan:

## 2012-01-15 NOTE — Assessment & Plan Note (Signed)
Obtain chem7 and a1c. If suboptimal consider change of starlix to alternative medication with qd dosing allowing for h/o CRI.

## 2012-01-15 NOTE — Telephone Encounter (Signed)
Lab order entered for July 2013. 

## 2012-01-15 NOTE — Assessment & Plan Note (Signed)
Wean off elavil

## 2012-01-15 NOTE — Patient Instructions (Signed)
Please schedule chem7, a1c 250.0 prior to next visit 

## 2012-01-16 LAB — MICROALBUMIN / CREATININE URINE RATIO
Creatinine, Urine: 55.6 mg/dL
Microalb Creat Ratio: 21.2 mg/g (ref 0.0–30.0)

## 2012-01-18 ENCOUNTER — Other Ambulatory Visit (HOSPITAL_BASED_OUTPATIENT_CLINIC_OR_DEPARTMENT_OTHER): Payer: Medicare Other | Admitting: Lab

## 2012-01-18 ENCOUNTER — Encounter: Payer: Self-pay | Admitting: *Deleted

## 2012-01-18 DIAGNOSIS — I2699 Other pulmonary embolism without acute cor pulmonale: Secondary | ICD-10-CM

## 2012-01-18 LAB — PROTIME-INR (CHCC SATELLITE): INR: 2.6 (ref 2.0–3.5)

## 2012-01-25 ENCOUNTER — Other Ambulatory Visit (INDEPENDENT_AMBULATORY_CARE_PROVIDER_SITE_OTHER): Payer: Medicare Other

## 2012-01-25 DIAGNOSIS — E785 Hyperlipidemia, unspecified: Secondary | ICD-10-CM

## 2012-01-25 LAB — LIPID PANEL
HDL: 40.8 mg/dL (ref >39.00)
Total CHOL/HDL Ratio: 4

## 2012-01-25 LAB — LDL CHOLESTEROL, DIRECT: Direct LDL: 72.1 mg/dL

## 2012-02-01 ENCOUNTER — Encounter: Payer: Self-pay | Admitting: Internal Medicine

## 2012-02-01 ENCOUNTER — Ambulatory Visit (INDEPENDENT_AMBULATORY_CARE_PROVIDER_SITE_OTHER): Payer: Medicare Other | Admitting: *Deleted

## 2012-02-01 DIAGNOSIS — I255 Ischemic cardiomyopathy: Secondary | ICD-10-CM

## 2012-02-01 DIAGNOSIS — I2589 Other forms of chronic ischemic heart disease: Secondary | ICD-10-CM

## 2012-02-09 LAB — REMOTE ICD DEVICE
AL AMPLITUDE: 4 mv
AL IMPEDENCE ICD: 489 Ohm
ATRIAL PACING ICD: 0 pct
BATTERY VOLTAGE: 2.58 V
CHARGE TIME: 18.4 s
DEV-0020ICD: NEGATIVE
HV IMPEDENCE: 48 Ohm
PACEART VT: 0
RV LEAD AMPLITUDE: 23.5 mv
RV LEAD IMPEDENCE ICD: 674 Ohm
TOT-0006: 20130418000000

## 2012-02-11 ENCOUNTER — Inpatient Hospital Stay (HOSPITAL_BASED_OUTPATIENT_CLINIC_OR_DEPARTMENT_OTHER)
Admission: EM | Admit: 2012-02-11 | Discharge: 2012-02-13 | DRG: 281 | Disposition: A | Discharge status: Home / Self Care | Payer: Medicare Other | Attending: Cardiology | Admitting: Cardiology

## 2012-02-11 ENCOUNTER — Encounter (HOSPITAL_BASED_OUTPATIENT_CLINIC_OR_DEPARTMENT_OTHER): Payer: Self-pay | Admitting: *Deleted

## 2012-02-11 ENCOUNTER — Emergency Department (HOSPITAL_BASED_OUTPATIENT_CLINIC_OR_DEPARTMENT_OTHER): Payer: Medicare Other

## 2012-02-11 DIAGNOSIS — I129 Hypertensive chronic kidney disease with stage 1 through stage 4 chronic kidney disease, or unspecified chronic kidney disease: Secondary | ICD-10-CM | POA: Diagnosis present

## 2012-02-11 DIAGNOSIS — Z888 Allergy status to other drugs, medicaments and biological substances status: Secondary | ICD-10-CM

## 2012-02-11 DIAGNOSIS — Z7982 Long term (current) use of aspirin: Secondary | ICD-10-CM

## 2012-02-11 DIAGNOSIS — R079 Chest pain, unspecified: Secondary | ICD-10-CM

## 2012-02-11 DIAGNOSIS — I509 Heart failure, unspecified: Secondary | ICD-10-CM | POA: Diagnosis present

## 2012-02-11 DIAGNOSIS — Z79899 Other long term (current) drug therapy: Secondary | ICD-10-CM

## 2012-02-11 DIAGNOSIS — I2699 Other pulmonary embolism without acute cor pulmonale: Secondary | ICD-10-CM | POA: Insufficient documentation

## 2012-02-11 DIAGNOSIS — K219 Gastro-esophageal reflux disease without esophagitis: Secondary | ICD-10-CM | POA: Diagnosis present

## 2012-02-11 DIAGNOSIS — E669 Obesity, unspecified: Secondary | ICD-10-CM | POA: Diagnosis present

## 2012-02-11 DIAGNOSIS — I2582 Chronic total occlusion of coronary artery: Secondary | ICD-10-CM | POA: Diagnosis present

## 2012-02-11 DIAGNOSIS — R5383 Other fatigue: Secondary | ICD-10-CM | POA: Insufficient documentation

## 2012-02-11 DIAGNOSIS — Z87891 Personal history of nicotine dependence: Secondary | ICD-10-CM

## 2012-02-11 DIAGNOSIS — N189 Chronic kidney disease, unspecified: Secondary | ICD-10-CM | POA: Diagnosis present

## 2012-02-11 DIAGNOSIS — R5381 Other malaise: Secondary | ICD-10-CM | POA: Diagnosis present

## 2012-02-11 DIAGNOSIS — I2589 Other forms of chronic ischemic heart disease: Secondary | ICD-10-CM | POA: Diagnosis present

## 2012-02-11 DIAGNOSIS — I6529 Occlusion and stenosis of unspecified carotid artery: Secondary | ICD-10-CM | POA: Diagnosis present

## 2012-02-11 DIAGNOSIS — Z6831 Body mass index (BMI) 31.0-31.9, adult: Secondary | ICD-10-CM

## 2012-02-11 DIAGNOSIS — I658 Occlusion and stenosis of other precerebral arteries: Secondary | ICD-10-CM | POA: Diagnosis present

## 2012-02-11 DIAGNOSIS — J189 Pneumonia, unspecified organism: Secondary | ICD-10-CM

## 2012-02-11 DIAGNOSIS — I214 Non-ST elevation (NSTEMI) myocardial infarction: Principal | ICD-10-CM | POA: Diagnosis present

## 2012-02-11 DIAGNOSIS — I5022 Chronic systolic (congestive) heart failure: Secondary | ICD-10-CM

## 2012-02-11 DIAGNOSIS — E785 Hyperlipidemia, unspecified: Secondary | ICD-10-CM | POA: Diagnosis present

## 2012-02-11 DIAGNOSIS — N259 Disorder resulting from impaired renal tubular function, unspecified: Secondary | ICD-10-CM | POA: Insufficient documentation

## 2012-02-11 DIAGNOSIS — G4700 Insomnia, unspecified: Secondary | ICD-10-CM | POA: Diagnosis present

## 2012-02-11 DIAGNOSIS — I2510 Atherosclerotic heart disease of native coronary artery without angina pectoris: Secondary | ICD-10-CM | POA: Diagnosis present

## 2012-02-11 DIAGNOSIS — Z95810 Presence of automatic (implantable) cardiac defibrillator: Secondary | ICD-10-CM

## 2012-02-11 DIAGNOSIS — Z7901 Long term (current) use of anticoagulants: Secondary | ICD-10-CM

## 2012-02-11 DIAGNOSIS — Z86711 Personal history of pulmonary embolism: Secondary | ICD-10-CM

## 2012-02-11 DIAGNOSIS — I25810 Atherosclerosis of coronary artery bypass graft(s) without angina pectoris: Secondary | ICD-10-CM | POA: Diagnosis present

## 2012-02-11 DIAGNOSIS — I255 Ischemic cardiomyopathy: Secondary | ICD-10-CM

## 2012-02-11 DIAGNOSIS — E119 Type 2 diabetes mellitus without complications: Secondary | ICD-10-CM | POA: Diagnosis present

## 2012-02-11 DIAGNOSIS — Z9849 Cataract extraction status, unspecified eye: Secondary | ICD-10-CM

## 2012-02-11 HISTORY — DX: Other pulmonary embolism without acute cor pulmonale: I26.99

## 2012-02-11 LAB — DIFFERENTIAL
Basophils Absolute: 0 10*3/uL (ref 0.0–0.1)
Basophils Relative: 0 % (ref 0–1)
Eosinophils Absolute: 0.2 10*3/uL (ref 0.0–0.7)
Eosinophils Relative: 3 % (ref 0–5)
Neutrophils Relative %: 69 % (ref 43–77)

## 2012-02-11 LAB — CBC
MCH: 32.5 pg (ref 26.0–34.0)
MCHC: 35.2 g/dL (ref 30.0–36.0)
MCV: 92.3 fL (ref 78.0–100.0)
Platelets: 163 10*3/uL (ref 150–400)
RBC: 4.16 MIL/uL — ABNORMAL LOW (ref 4.22–5.81)
RDW: 13.5 % (ref 11.5–15.5)

## 2012-02-11 LAB — COMPREHENSIVE METABOLIC PANEL
ALT: 22 U/L (ref 0–53)
Albumin: 4.1 g/dL (ref 3.5–5.2)
Alkaline Phosphatase: 46 U/L (ref 39–117)
Calcium: 9.9 mg/dL (ref 8.4–10.5)
GFR calc Af Amer: 39 mL/min — ABNORMAL LOW (ref >90)
Potassium: 5 mEq/L (ref 3.5–5.1)
Sodium: 138 mEq/L (ref 135–145)
Total Protein: 7.5 g/dL (ref 6.0–8.3)

## 2012-02-11 LAB — CARDIAC PANEL(CRET KIN+CKTOT+MB+TROPI)
CK, MB: 3.4 ng/mL (ref 0.3–4.0)
CK, MB: 5.8 ng/mL — ABNORMAL HIGH (ref 0.3–4.0)
Relative Index: 4.6 — ABNORMAL HIGH (ref 0.0–2.5)
Total CK: 125 U/L (ref 7–232)
Total CK: 134 U/L (ref 7–232)

## 2012-02-11 LAB — PROTIME-INR: Prothrombin Time: 20.7 seconds — ABNORMAL HIGH (ref 11.6–15.2)

## 2012-02-11 MED ORDER — SODIUM CHLORIDE 0.9 % IJ SOLN: 3.0000 mL | INTRAMUSCULAR | Status: DC | PRN

## 2012-02-11 MED ORDER — ASPIRIN 81 MG PO TBEC: 81.0000 mg | DELAYED_RELEASE_TABLET | Freq: Every day | ORAL | Status: DC

## 2012-02-11 MED ORDER — NITROGLYCERIN 2 % TD OINT
1.0000 [in_us] | TOPICAL_OINTMENT | Freq: Once | TRANSDERMAL | Status: AC
  Administered 2012-02-11: 1 [in_us] via TOPICAL
  Filled 2012-02-11: qty 1

## 2012-02-11 MED ORDER — DEXTROSE 5 % IV SOLN
500.0000 mg | Freq: Once | INTRAVENOUS | Status: DC
  Filled 2012-02-11: qty 500

## 2012-02-11 MED ORDER — HEPARIN (PORCINE) IN NACL 100-0.45 UNIT/ML-% IJ SOLN
1150.0000 [IU]/h | INTRAMUSCULAR | Status: DC
  Administered 2012-02-11 – 2012-02-12 (×2): 1150 [IU]/h via INTRAVENOUS
  Filled 2012-02-11 (×2): qty 250

## 2012-02-11 MED ORDER — ONDANSETRON HCL 4 MG/2ML IJ SOLN: 4.0000 mg | Freq: Three times a day (TID) | INTRAMUSCULAR | Status: DC | PRN

## 2012-02-11 MED ORDER — SODIUM CHLORIDE 0.9 % IV SOLN: 250.0000 mL | INTRAVENOUS | Status: DC | PRN

## 2012-02-11 MED ORDER — CARVEDILOL 12.5 MG PO TABS
12.5000 mg | ORAL_TABLET | Freq: Two times a day (BID) | ORAL | Status: DC
  Administered 2012-02-11 – 2012-02-12 (×2): 12.5 mg via ORAL
  Filled 2012-02-11 (×4): qty 1

## 2012-02-11 MED ORDER — ASPIRIN 81 MG PO CHEW
324.0000 mg | CHEWABLE_TABLET | Freq: Once | ORAL | Status: AC
  Administered 2012-02-11: 324 mg via ORAL
  Filled 2012-02-11: qty 4

## 2012-02-11 MED ORDER — SODIUM CHLORIDE 0.9 % IJ SOLN
3.0000 mL | Freq: Two times a day (BID) | INTRAMUSCULAR | Status: DC
  Administered 2012-02-12: 3 mL via INTRAVENOUS

## 2012-02-11 MED ORDER — NITROGLYCERIN 0.4 MG SL SUBL: 0.4000 mg | SUBLINGUAL_TABLET | SUBLINGUAL | Status: DC | PRN

## 2012-02-11 MED ORDER — AMITRIPTYLINE HCL 25 MG PO TABS
25.0000 mg | ORAL_TABLET | Freq: Every day | ORAL | Status: DC
  Administered 2012-02-11 – 2012-02-12 (×2): 25 mg via ORAL
  Filled 2012-02-11 (×3): qty 1

## 2012-02-11 MED ORDER — ASPIRIN 81 MG PO CHEW
324.0000 mg | CHEWABLE_TABLET | ORAL | Status: AC
  Administered 2012-02-12: 324 mg via ORAL
  Filled 2012-02-11: qty 4

## 2012-02-11 MED ORDER — NATEGLINIDE 60 MG PO TABS
60.0000 mg | ORAL_TABLET | Freq: Two times a day (BID) | ORAL | Status: DC
  Administered 2012-02-11 – 2012-02-13 (×3): 60 mg via ORAL
  Filled 2012-02-11 (×6): qty 1

## 2012-02-11 MED ORDER — ZOLPIDEM TARTRATE 5 MG PO TABS
5.0000 mg | ORAL_TABLET | Freq: Every evening | ORAL | Status: DC | PRN
  Administered 2012-02-11 – 2012-02-12 (×2): 5 mg via ORAL
  Filled 2012-02-11 (×2): qty 1

## 2012-02-11 MED ORDER — SODIUM CHLORIDE 0.45 % IV SOLN
INTRAVENOUS | Status: DC
  Administered 2012-02-12: 11:00:00 via INTRAVENOUS

## 2012-02-11 MED ORDER — ASPIRIN EC 81 MG PO TBEC
81.0000 mg | DELAYED_RELEASE_TABLET | Freq: Every day | ORAL | Status: DC
  Administered 2012-02-13: 81 mg via ORAL
  Filled 2012-02-11: qty 1

## 2012-02-11 MED ORDER — ACETAMINOPHEN 500 MG PO TABS
500.0000 mg | ORAL_TABLET | Freq: Four times a day (QID) | ORAL | Status: DC | PRN
  Administered 2012-02-12 – 2012-02-13 (×2): 500 mg via ORAL
  Filled 2012-02-11 (×2): qty 1

## 2012-02-11 MED ORDER — ONDANSETRON HCL 4 MG/2ML IJ SOLN: 4.0000 mg | Freq: Four times a day (QID) | INTRAMUSCULAR | Status: DC | PRN

## 2012-02-11 MED ORDER — DEXTROSE 5 % IV SOLN
1.0000 g | Freq: Once | INTRAVENOUS | Status: AC
  Administered 2012-02-11: 1 g via INTRAVENOUS
  Filled 2012-02-11: qty 10

## 2012-02-11 MED ORDER — ATORVASTATIN CALCIUM 80 MG PO TABS
80.0000 mg | ORAL_TABLET | Freq: Every day | ORAL | Status: DC
  Administered 2012-02-11 – 2012-02-12 (×2): 80 mg via ORAL
  Filled 2012-02-11 (×3): qty 1

## 2012-02-11 MED ORDER — PANTOPRAZOLE SODIUM 40 MG PO TBEC
40.0000 mg | DELAYED_RELEASE_TABLET | Freq: Every day | ORAL | Status: DC
  Administered 2012-02-11 – 2012-02-13 (×3): 40 mg via ORAL
  Filled 2012-02-11 (×3): qty 1

## 2012-02-11 NOTE — ED Notes (Signed)
The patient is undressed from the waist up and in a gown. The call light is within reach and the bed is locked and in the lowest position.

## 2012-02-11 NOTE — Progress Notes (Signed)
Patient ID: Joseph Hamilton, male   DOB: 27-Mar-1941, 71 y.o.   MRN: [id number]   Patient ID: Joseph Hamilton MRN: [id number], DOB/AGE: [birth year]   Admit date: 02/11/2012   Primary Physician: Letitia Libra, Ala Dach, MD, MD Primary Cardiologist: Nicholes Mango , Berton Mount  Pt. Profile: Mr Bos is a 71 year old white male admitted with symptoms consistent with his previous myocardial infarctions. Problem List  Past Medical History  Diagnosis Date  . Coronary artery disease     s/p MI X 2, REDO CABG 2003, MYOVIEW 12/09 EF 28% , EXTENSIVE ANTERIOR, INFERIOR AND LATERAL INFARCT. VERY MILD ANTERIOR ISCHEMIA  . CHF (congestive heart failure)     SECONDARY TO ISCHEMIC CARDIOMYOPATHY ; EF 28% BY MYOVIEW ; 50% BY ECHO 07/2008; ECHO 09/2009 EF "MILD TO MODERATELY REDUCED"  . Hypertension   . Hyperlipidemia   . Dizziness     CHRONIC; s/p EXTENSIVE EVAL, s/p EVAL @ [facility]; HEADACHE:  s/p EXTENSIVE EVAL; ON ELAVIL  . Sleep apnea 03/31/10    UNABLE TO TOLERATE CPAP; > HOME SLEEP TEST AHI 15.6  . Obesity   . Dupuytren contracture   . Nephrolithiasis   . CRI (chronic renal insufficiency)     BASELINE Cr. 1.7 ----DR. MATTINGLY  . Carotid artery occlusion     0-39% BILATERALLY W/CHRONICALLY OCCLUDED L VERTEBRAL  . Myocardial infarction   . Angina   . ICD (implantable cardiac defibrillator) in place   . Pacemaker   . PE (pulmonary embolism) 2005    Past Surgical History  Procedure Date  . Insert / replace / remove pacemaker 2006    s/p GUIDANT IMPLANTABLE CARDIVERTER DEFIBRILLATOR  . Coronary artery bypass graft 1994    1994 AND 2003  . Cataract extraction   . Tonsillectomy     AS A CHILD  . Kidney stone surgery      Allergies  Allergies  Allergen Reactions  . Ezetimibe-Simvastatin     REACTION: myalgia: TOLERATES PRAVOCHOL OK    HPI He awoke this morning with heaviness in feeling tired across his back. He had some tingling in his left arm. It was worsened with activity. He did not  have any sublingual nitroglycerin. He went to the emergency room where he had no acute EKG changes and his initial enzymes were negative. His discomfort went away with rest.  Yesterday was a normal day. Denying any orthopnea, PND or edema. He's had no presyncope or syncope. He's had no palpitations. He denies any fever, chills, or sweats. He's had no cough  He does have chronic chest pain from severe keloid formation from his previous 2 bypass procedures. This was very different.  Of note his creatinine is 1.9. He's on ACE , spironolactone, and Lasix. He is a non-insulin requiring diabetic.  He is currently symptom-free on arrival to the hospital.   Home Medications  Prior to Admission medications   Medication Sig Start Date End Date Taking? Authorizing Provider  acetaminophen (TYLENOL) 500 MG tablet Take 500 mg by mouth every 6 (six) hours as needed. For pain   Yes Historical Provider, MD  amitriptyline (ELAVIL) 25 MG tablet Take 25 mg by mouth at bedtime.   Yes Historical Provider, MD  aspirin 81 MG EC tablet Take 81 mg by mouth daily.     Yes Historical Provider, MD  carvedilol (COREG) 25 MG tablet Take 12.5 mg by mouth 2 (two) times daily with a meal.   Yes Historical Provider, MD  furosemide (LASIX) 40 MG tablet Take  40 mg by mouth daily.   Yes Historical Provider, MD  nateglinide (STARLIX) 60 MG tablet Take 60 mg by mouth 2 (two) times daily with a meal. Take with the two largest meals of the day   Yes Historical Provider, MD  omeprazole (PRILOSEC) 20 MG capsule Take 20 mg by mouth daily.   Yes Historical Provider, MD  ramipril (ALTACE) 10 MG capsule Take 1 capsule (10 mg total) by mouth daily. 02/21/11  Yes Dolores Patty, MD  rosuvastatin (CRESTOR) 40 MG tablet Take 40 mg by mouth daily.   Yes Historical Provider, MD  spironolactone (ALDACTONE) 25 MG tablet Take 12.5 mg by mouth daily.   Yes Historical Provider, MD  warfarin ([name]) 3 MG tablet Take 3 mg by mouth daily.   Yes  Historical Provider, MD  zolpidem (AMBIEN CR) 12.5 MG CR tablet Take 12.5 mg by mouth at bedtime.   Yes Historical Provider, MD    Family History  No family history on file.  Social History  History   Social History  . Marital Status: Divorced    Spouse Name: N/A    Number of Children: N/A  . Years of Education: N/A   Occupational History  . RETIRED     WORKS PART TIME   Social History Main Topics  . Smoking status: Former Smoker    Types: Pipe    Quit date: 09/25/1993  . Smokeless tobacco: Never Used  . Alcohol Use: Yes     1 DRINK PER DAY  . Drug Use: No  . Sexually Active: Not on file   Other Topics Concern  . Not on file   Social History Narrative   DIVORCEDLIVES BY HIMSELF3 CHILDR[place] 1995ETOH 1 A DAYNO EXERCISEICD-[place] SCIENT[employer].Marland KitchenMarland KitchenREMOTE YES     Review of Systems General:  No chills, fever, night sweats or weight changes.  Cardiovascular:  No chest pain, dyspnea on exertion, edema, orthopnea, palpitations, paroxysmal nocturnal dyspnea. Dermatological: No rash, lesions/masses Respiratory: No cough, dyspnea Urologic: No hematuria, dysuria Abdominal:   No nausea, vomiting, diarrhea, bright red blood per rectum, melena, or hematemesis Neurologic:  No visual changes, wkns, changes in mental status. All other systems reviewed and are otherwise negative except as noted above.  Physical Exam  Blood pressure 130/82, pulse 67, temperature 97.8 F (36.6 C), temperature source Oral, resp. rate 18, SpO2 100.00%.  General: Pleasant, NAD Psych: Normal affect. Neuro: Alert and oriented X 3. Moves all extremities spontaneously. HEENT: Normal  Neck: Supple without bruits or JVD. Lungs:  Resp regular and unlabored, CTA. Heart: RRR no s3, s4, or murmurs. Abdomen: Soft, non-tender, non-distended, BS + x 4.  Extremities: No clubbing, cyanosis or edema. DP/PT/Radials 1+ and equal bilaterally. Chest with severe keloid.   Labs   Basename 02/11/12 1030  CKTOTAL 105  CKMB 3.4  TROPONINI <0.30   Lab Results  Component Value Date   WBC 6.6 02/11/2012   HGB 13.5 02/11/2012   HCT 38.4* 02/11/2012   MCV 92.3 02/11/2012   PLT 163 02/11/2012    Lab 02/11/12 1030  NA 138  K 5.0  CL 104  CO2 23  BUN 49*  CREATININE 1.90*  CALCIUM 9.9  PROT 7.5  BILITOT 0.4  ALKPHOS 46  ALT 22  AST 16  GLUCOSE 130*   Lab Results  Component Value Date   CHOL 146 01/25/2012   HDL 40.80 01/25/2012   LDLCALC 41 09/05/2010   TRIG 286.0* 01/25/2012   Lab Results  Component Value Date   DDIMER 0.26 11/20/2011     Radiology/Studies  Dg Chest 2 View  02/11/2012  *RADIOLOGY REPORT*  Clinical Data: Chest pain.  Shortness of breath.  CHEST - 2 VIEW  Comparison: No priors.  Findings: Lung volumes are normal. There is an ill defined opacity projecting over the right lower lobe, with some associated bronchial Jaydis Duchene thickening.  No pleural effusions.  There is mild pulmonary venous congestion without frank pulmonary edema. Heart size is mildly enlarged with prominence of the left ventricular contour which suggests underlying left ventricular hypertrophy. The patient is rotated to the left on today's exam, resulting in distortion of the mediastinal contours and reduced diagnostic sensitivity and specificity for mediastinal pathology. Atherosclerotic calcifications in the arch of the aorta. Status post median sternotomy for CABG with a LIMA.  A left-sided pacemaker/AICD is in place with lead tips projecting over the expected location of the right atrial appendage and right ventricular apex.  IMPRESSION: 1.  Ill-defined opacity in the right lower lobe with associated bronchial Coby Antrobus thickening.  This could represent an early bronchopneumonia or sequelae of mild aspiration.  Clinical correlation is recommended. 2.  Pulmonary venous congestion without frank pulmonary edema. 3.  Mild cardiomegaly with evidence to suggest left ventricular hypertrophy.  4.  Atherosclerosis. 5.  Postoperative changes and support apparatus, as above.  Original Report Authenticated By: Florencia Reasons, M.D.    ECG  NORMAL SINUS RHYTHM, POOR R-WAVE PROGRESSION ANTERIOR PRECORDIUM CONSISTENT WITH PREVIOUS ANTERIOR SEPTAL AND LATERAL Graham Doukas INFARCT.   ASSESSMENT AND PLAN  Mr Costanzo presents with recurrent symptoms consistent with previous acute coronary syndromes. Relieved with rest. He is high risk coronary anatomy with previous anterior septal and lateral Kaesyn Johnston infarct not to mention bypass surgery twice. He does a lot of keloid formation. I suspect his ejection fraction  Is in the 30-35 range. His renal insufficiency will preclude significant dye load.  We'll start IV heparin with his INR is subtherapeutic. We'll gently hydrate with 50 cc of half-normal saline. We'll check a metabolic profile in the morning. We'll hold Lasix, spironolactone, and ramipril.  Indication, risks, and potential benefit discussed with the patient and his daughter. They agree to proceed.   Signed, Valera Castle, MD 02/11/2012, @NOW

## 2012-02-11 NOTE — Progress Notes (Signed)
CRITICAL VALUE ALERT  Critical value received:  Trop 1.56  Date of notification:02/11/12  Time of notification:  1625  Critical value read back:yes  Nurse who received alert:  M. Selena Swaminathan RN  MD notified (1st page):  Tereso Newcomer PA   Time of first page:  1625  MD notified (2nd page):  Time of second page:  Responding MD:  Tereso Newcomer PA  Time MD responded:  [phone]

## 2012-02-11 NOTE — Progress Notes (Signed)
ANTICOAGULATION CONSULT NOTE - Initial Consult  Pharmacy Consult for Heparin Indication: chest pain/ACS and pulmonary embolus  Allergies  Allergen Reactions  . Ezetimibe-Simvastatin     REACTION: myalgia: TOLERATES PRAVOCHOL OK    Patient Measurements: Height: 5\' 10"  (177.8 cm) Weight: 220 lb (99.791 kg) IBW/kg (Calculated) : 73  Heparin Dosing Weight: 93.8 kg  Vital Signs: Temp: 97.7 F (36.5 C) (05/19 1345) Temp src: Oral (05/19 1345) BP: 116/70 mmHg (05/19 1345) Pulse Rate: 67  (05/19 1345)  Labs:  Basename 02/11/12 1030  HGB 13.5  HCT 38.4*  PLT 163  APTT 31  LABPROT 20.7*  INR 1.74*  HEPARINUNFRC --  CREATININE 1.90*  CKTOTAL 105  CKMB 3.4  TROPONINI <0.30    Estimated Creatinine Clearance: 42.2 ml/min (by C-G formula based on Cr of 1.9).   Medical History: Past Medical History  Diagnosis Date  . Coronary artery disease     s/p MI X 2, REDO CABG 2003, MYOVIEW 12/09 EF 28% , EXTENSIVE ANTERIOR, INFERIOR AND LATERAL INFARCT. VERY MILD ANTERIOR ISCHEMIA  . CHF (congestive heart failure)     SECONDARY TO ISCHEMIC CARDIOMYOPATHY ; EF 28% BY MYOVIEW ; 50% BY ECHO 07/2008; ECHO 09/2009 EF "MILD TO MODERATELY REDUCED"  . Hypertension   . Hyperlipidemia   . Dizziness     CHRONIC; s/p EXTENSIVE EVAL, s/p EVAL @ [facility]; HEADACHE:  s/p EXTENSIVE EVAL; ON ELAVIL  . Sleep apnea 03/31/10    UNABLE TO TOLERATE CPAP; > HOME SLEEP TEST AHI 15.6  . Obesity   . Dupuytren contracture   . Nephrolithiasis   . CRI (chronic renal insufficiency)     BASELINE Cr. 1.7 ----DR. MATTINGLY  . Carotid artery occlusion     0-39% BILATERALLY W/CHRONICALLY OCCLUDED L VERTEBRAL  . Myocardial infarction   . Angina   . ICD (implantable cardiac defibrillator) in place   . Pacemaker   . PE (pulmonary embolism) 2005    Medications:  Prescriptions prior to admission  Medication Sig Dispense Refill  . acetaminophen (TYLENOL) 500 MG tablet Take 500 mg by mouth every 6 (six) hours as  needed. For pain      . amitriptyline (ELAVIL) 25 MG tablet Take 25 mg by mouth at bedtime.      Marland Kitchen aspirin 81 MG EC tablet Take 81 mg by mouth daily.        . carvedilol (COREG) 25 MG tablet Take 12.5 mg by mouth 2 (two) times daily with a meal.      . furosemide (LASIX) 40 MG tablet Take 40 mg by mouth daily.      . nateglinide (STARLIX) 60 MG tablet Take 60 mg by mouth 2 (two) times daily with a meal. Take with the two largest meals of the day      . omeprazole (PRILOSEC) 20 MG capsule Take 20 mg by mouth daily.      . ramipril (ALTACE) 10 MG capsule Take 1 capsule (10 mg total) by mouth daily.  30 capsule  5  . rosuvastatin (CRESTOR) 40 MG tablet Take 40 mg by mouth daily.      Marland Kitchen spironolactone (ALDACTONE) 25 MG tablet Take 12.5 mg by mouth daily.      Marland Kitchen warfarin ([name]) 3 MG tablet Take 3 mg by mouth daily.      Marland Kitchen zolpidem (AMBIEN CR) 12.5 MG CR tablet Take 12.5 mg by mouth at bedtime.        Assessment: Chest Pain, history of PE:  Patient  is admitted with CP.  His INR is subtherapeutic and Coumadin is being held for possible intervention.  To begin anticoagulation with Heparin.    Goal of Therapy:  Heparin level 0.3-0.7 units/ml Monitor platelets by anticoagulation protocol: Yes   Plan:  No Heparin bolus since INR is 1.7. Start Heparin drip at 1150 units/hr (~12 units/kg/hr). Check Heparin level in 8 hours Daily Heparin level and CBC  Estella Husk, Pharm.D., BCPS Clinical Pharmacist  Phone [phone] Pager [phone] 02/11/2012, 3:23 PM

## 2012-02-11 NOTE — ED Notes (Signed)
Patient reports back/chest pain this am with SOB during exertion. Hx two surgeries triple bypass, PE 2005

## 2012-02-11 NOTE — ED Provider Notes (Signed)
History     CSN: [id number]  Arrival date & time 02/11/12  1011   First MD Initiated Contact with Patient 02/11/12 1041      Chief Complaint  Patient presents with  . Chest Pain    (Consider location/radiation/quality/duration/timing/severity/associated sxs/prior treatment) Patient is a 71 y.o. male presenting with chest pain. The history is provided by the patient. No language interpreter was used.  Chest Pain The chest pain began 3 - 5 hours ago. Chest pain occurs constantly. The chest pain is unchanged. The pain is associated with exertion. The severity of the pain is moderate. The quality of the pain is described as similar to previous episodes and pressure-like (fatigue). The pain radiates to the upper back. Chest pain is worsened by exertion. Primary symptoms include fatigue, shortness of breath and nausea. Pertinent negatives for primary symptoms include no fever, no syncope, no cough, no wheezing, no palpitations, no abdominal pain, no vomiting and no dizziness.  The fatigue began today. The fatigue has been worsening since its onset. The fatigue is worsened by exertion.  The shortness of breath began today. The shortness of breath developed with exertion. The shortness of breath is moderate.  Pertinent negatives for associated symptoms include no diaphoresis, no numbness, no orthopnea, no paroxysmal nocturnal dyspnea and no weakness. He tried nothing for the symptoms. Risk factors include male gender.  His past medical history is significant for CAD and MI.     Past Medical History  Diagnosis Date  . Coronary artery disease     s/p MI X 2, REDO CABG 2003, MYOVIEW 12/09 EF 28% , EXTENSIVE ANTERIOR, INFERIOR AND LATERAL INFARCT. VERY MILD ANTERIOR ISCHEMIA  . CHF (congestive heart failure)     SECONDARY TO ISCHEMIC CARDIOMYOPATHY ; EF 28% BY MYOVIEW ; 50% BY ECHO 07/2008; ECHO 09/2009 EF "MILD TO MODERATELY REDUCED"  . Hypertension   . Hyperlipidemia   . Dizziness     CHRONIC;  s/p EXTENSIVE EVAL, s/p EVAL @ [facility]; HEADACHE:  s/p EXTENSIVE EVAL; ON ELAVIL  . Sleep apnea 03/31/10    UNABLE TO TOLERATE CPAP; > HOME SLEEP TEST AHI 15.6  . Obesity   . Dupuytren contracture   . Osteoarthritis   . Nephrolithiasis   . Diverticulitis 11/2004    on colonoscopy;  repeat due 2016  . CRI (chronic renal insufficiency)     BASELINE Cr. 1.7 ----DR. MATTINGLY  . Carotid artery occlusion     0-39% BILATERALLY W/CHRONICALLY OCCLUDED L VERTEBRAL    Past Surgical History  Procedure Date  . Insert / replace / remove pacemaker 2006    s/p GUIDANT IMPLANTABLE CARDIVERTER DEFIBRILLATOR  . Coronary artery bypass graft 1994    1994 AND 2003  . Cataract extraction   . Tonsillectomy     AS A CHILD    No family history on file.  History  Substance Use Topics  . Smoking status: Former Smoker    Types: Pipe    Quit date: 09/25/1993  . Smokeless tobacco: Never Used  . Alcohol Use: Yes     1 DRINK PER DAY      Review of Systems  Constitutional: Positive for fatigue. Negative for fever, chills, diaphoresis, activity change and appetite change.  HENT: Negative for congestion, sore throat, rhinorrhea, neck pain and neck stiffness.   Respiratory: Positive for shortness of breath. Negative for cough and wheezing.   Cardiovascular: Positive for chest pain. Negative for palpitations, orthopnea and syncope.  Gastrointestinal: Positive for nausea. Negative for  vomiting, abdominal pain and constipation.  Genitourinary: Negative for dysuria, urgency, frequency and flank pain.  Musculoskeletal: Positive for back pain. Negative for arthralgias.  Neurological: Negative for dizziness, weakness, light-headedness, numbness and headaches.  All other systems reviewed and are negative.    Allergies  Ezetimibe-simvastatin  Home Medications   Current Outpatient Rx  Name Route Sig Dispense Refill  . ACETAMINOPHEN 500 MG PO TABS Oral Take 500 mg by mouth every 6 (six) hours as needed.     Marland Kitchen AMITRIPTYLINE HCL 25 MG PO TABS  TAKE ONE TABLET DAILY AT BEDTIME 90 tablet 1  . ASPIRIN 81 MG PO TBEC Oral Take 81 mg by mouth daily.      Marland Kitchen CARVEDILOL 25 MG PO TABS  Take 1/2 tab by mouth Twice daily  60 tablet 11  . CRESTOR 40 MG PO TABS  TAKE ONE (1) TABLET EACH DAY 30 tablet 6  . FUROSEMIDE 40 MG PO TABS Oral Take 1 tablet (40 mg total) by mouth daily. 30 tablet 6  . NATEGLINIDE 60 MG PO TABS  TAKE ONE TABLET TWICE DAILY A FEW       MINUTES BEFORE THE 2 LARGEST MEALS 60 tablet 3  . OMEPRAZOLE 20 MG PO CPDR Oral Take 1 capsule (20 mg total) by mouth daily. 90 capsule 1  . RAMIPRIL 10 MG PO CAPS Oral Take 1 capsule (10 mg total) by mouth daily. 30 capsule 5  . SPIRONOLACTONE 25 MG PO TABS Oral Take 1 tablet (25 mg total) by mouth daily. 1/2 Tablet Daily 30 tablet 4  . WARFARIN SODIUM 2 MG PO TABS Oral Take 3 mg by mouth daily. Takes 2 daily    . ZOLPIDEM TARTRATE ER 12.5 MG PO TBCR Oral Take 1 tablet (12.5 mg total) by mouth at bedtime as needed. 30 tablet 5    BP 141/86  Pulse 90  Temp(Src) 97.8 F (36.6 C) (Oral)  Resp 18  SpO2 100%  Physical Exam  Nursing note and vitals reviewed. Constitutional: He is oriented to person, place, and time. He appears well-developed and well-nourished. No distress.  HENT:  Head: Normocephalic and atraumatic.  Mouth/Throat: Oropharynx is clear and moist.  Eyes: Conjunctivae and EOM are normal. Pupils are equal, round, and reactive to light.  Neck: Normal range of motion. Neck supple.  Cardiovascular: Normal rate, regular rhythm, normal heart sounds and intact distal pulses.  Exam reveals no gallop and no friction rub.   No murmur heard. Pulmonary/Chest: Effort normal and breath sounds normal. No respiratory distress. He exhibits no tenderness.       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 Bowel sounds are normal. There is no tenderness. There is no rebound and no guarding.  Musculoskeletal: Normal range of motion. He  exhibits no edema and no tenderness.  Neurological: He is alert and oriented to person, place, and time. No cranial nerve deficit.  Skin: Skin is warm and dry. No rash noted.    ED Course  Procedures (including critical care time)   Date: 02/11/2012  Rate: 88  Rhythm: normal sinus rhythm  QRS Axis: normal  Intervals: PR prolonged  ST/T Wave abnormalities: normal  Conduction Disutrbances:first-degree A-V block   Narrative Interpretation:   Old EKG Reviewed: unchanged  Labs Reviewed  CBC - Abnormal; Notable for the following:    RBC 4.16 (*)    HCT 38.4 (*)    All other components within normal limits  COMPREHENSIVE METABOLIC PANEL - Abnormal; Notable for  the following:    Glucose, Bld 130 (*)    BUN 49 (*)    Creatinine, Ser 1.90 (*)    GFR calc non Af Amer 34 (*)    GFR calc Af Amer 39 (*)    All other components within normal limits  CARDIAC PANEL(CRET KIN+CKTOT+MB+TROPI) - Abnormal; Notable for the following:    Relative Index 3.2 (*)    All other components within normal limits  PRO B NATRIURETIC PEPTIDE - Abnormal; Notable for the following:    Pro B Natriuretic peptide (BNP) 360.2 (*)    All other components within normal limits  PROTIME-INR - Abnormal; Notable for the following:    Prothrombin Time 20.7 (*)    INR 1.74 (*)    All other components within normal limits  DIFFERENTIAL  APTT   No results found.   1. Chest pain       MDM  Chest pain concerning for unstable angina. She received aspirin and nitro paste in the emergency department. He is pain-free. No indication for heparin. Discussed with the cardiologist Dr. wall who accepted the patient for admission at [facility]. Initial enzymes negative. EKG is stable.        Dayton Bailiff, MD 02/11/12 1158

## 2012-02-12 ENCOUNTER — Encounter (HOSPITAL_COMMUNITY)
Admission: EM | Disposition: A | Discharge status: Home / Self Care | Payer: Self-pay | Source: Home / Self Care | Attending: Cardiology

## 2012-02-12 DIAGNOSIS — I214 Non-ST elevation (NSTEMI) myocardial infarction: Secondary | ICD-10-CM

## 2012-02-12 DIAGNOSIS — I359 Nonrheumatic aortic valve disorder, unspecified: Secondary | ICD-10-CM

## 2012-02-12 DIAGNOSIS — I2510 Atherosclerotic heart disease of native coronary artery without angina pectoris: Secondary | ICD-10-CM

## 2012-02-12 HISTORY — PX: LEFT AND RIGHT HEART CATHETERIZATION WITH CORONARY/GRAFT ANGIOGRAM: SHX5448

## 2012-02-12 LAB — LIPID PANEL
Cholesterol: 146 mg/dL (ref 0–200)
LDL Cholesterol: 61 mg/dL (ref 0–99)
Triglycerides: 237 mg/dL — ABNORMAL HIGH (ref <150)

## 2012-02-12 LAB — GLUCOSE, CAPILLARY
Glucose-Capillary: 132 mg/dL — ABNORMAL HIGH (ref 70–99)
Glucose-Capillary: 107 mg/dL — ABNORMAL HIGH (ref 70–99)
Glucose-Capillary: 154 mg/dL — ABNORMAL HIGH (ref 70–99)

## 2012-02-12 LAB — BASIC METABOLIC PANEL
CO2: 23 mEq/L (ref 19–32)
Chloride: 100 mEq/L (ref 96–112)
Creatinine, Ser: 1.91 mg/dL — ABNORMAL HIGH (ref 0.50–1.35)
GFR calc Af Amer: 39 mL/min — ABNORMAL LOW (ref >90)
Potassium: 4.1 mEq/L (ref 3.5–5.1)

## 2012-02-12 LAB — CBC
HCT: 36.3 % — ABNORMAL LOW (ref 39.0–52.0)
Hemoglobin: 13.2 g/dL (ref 13.0–17.0)
Hemoglobin: 12.3 g/dL — ABNORMAL LOW (ref 13.0–17.0)
MCH: 31.5 pg (ref 26.0–34.0)
MCH: 31.2 pg (ref 26.0–34.0)
MCHC: 33.9 g/dL (ref 30.0–36.0)
MCV: 92.1 fL (ref 78.0–100.0)
Platelets: 135 10*3/uL — ABNORMAL LOW (ref 150–400)
Platelets: 127 K/uL — ABNORMAL LOW (ref 150–400)
RBC: 4.19 MIL/uL — ABNORMAL LOW (ref 4.22–5.81)
RBC: 3.94 MIL/uL — ABNORMAL LOW (ref 4.22–5.81)
RDW: 13.7 % (ref 11.5–15.5)
WBC: 7.4 10*3/uL (ref 4.0–10.5)
WBC: 5.4 K/uL (ref 4.0–10.5)

## 2012-02-12 LAB — PROTIME-INR
INR: 1.7 — ABNORMAL HIGH (ref 0.00–1.49)
Prothrombin Time: 20.3 seconds — ABNORMAL HIGH (ref 11.6–15.2)

## 2012-02-12 LAB — POCT I-STAT 3, VENOUS BLOOD GAS (G3P V)
TCO2: 21 mmol/L (ref 0–100)
pH, Ven: 7.346 — ABNORMAL HIGH (ref 7.250–7.300)

## 2012-02-12 LAB — CREATININE, SERUM
Creatinine, Ser: 1.76 mg/dL — ABNORMAL HIGH (ref 0.50–1.35)
GFR calc non Af Amer: 37 mL/min — ABNORMAL LOW (ref >90)

## 2012-02-12 LAB — TSH: TSH: 1.312 u[IU]/mL (ref 0.350–4.500)

## 2012-02-12 LAB — CARDIAC PANEL(CRET KIN+CKTOT+MB+TROPI)
Relative Index: 3.8 — ABNORMAL HIGH (ref 0.0–2.5)
Total CK: 127 U/L (ref 7–232)
Troponin I: 0.72 ng/mL (ref <0.30)

## 2012-02-12 LAB — POCT I-STAT 3, ART BLOOD GAS (G3+)
Acid-base deficit: 4 mmol/L — ABNORMAL HIGH (ref 0.0–2.0)
pCO2 arterial: 33.5 mmHg — ABNORMAL LOW (ref 35.0–45.0)
pH, Arterial: 7.385 (ref 7.350–7.450)
pO2, Arterial: 67 mmHg — ABNORMAL LOW (ref 80.0–100.0)

## 2012-02-12 SURGERY — LEFT AND RIGHT HEART CATHETERIZATION WITH CORONARY/GRAFT ANGIOGRAM
Anesthesia: LOCAL

## 2012-02-12 MED ORDER — WARFARIN - PHARMACIST DOSING INPATIENT: Freq: Every day | Status: DC

## 2012-02-12 MED ORDER — FENTANYL CITRATE 0.05 MG/ML IJ SOLN
INTRAMUSCULAR | Status: AC
  Filled 2012-02-12: qty 2

## 2012-02-12 MED ORDER — CARVEDILOL 25 MG PO TABS
25.0000 mg | ORAL_TABLET | Freq: Two times a day (BID) | ORAL | Status: DC
  Administered 2012-02-12 – 2012-02-13 (×2): 25 mg via ORAL
  Filled 2012-02-12 (×4): qty 1

## 2012-02-12 MED ORDER — SODIUM CHLORIDE 0.9 % IV SOLN: 1.0000 mL/kg/h | INTRAVENOUS | Status: AC

## 2012-02-12 MED ORDER — MIDAZOLAM HCL 2 MG/2ML IJ SOLN
INTRAMUSCULAR | Status: AC
  Filled 2012-02-12: qty 2

## 2012-02-12 MED ORDER — WARFARIN SODIUM 4 MG PO TABS
4.0000 mg | ORAL_TABLET | Freq: Once | ORAL | Status: AC
  Administered 2012-02-12: 4 mg via ORAL
  Filled 2012-02-12: qty 1

## 2012-02-12 MED ORDER — HEPARIN SODIUM (PORCINE) 5000 UNIT/ML IJ SOLN
5000.0000 [IU] | Freq: Three times a day (TID) | INTRAMUSCULAR | Status: DC
  Administered 2012-02-12 – 2012-02-13 (×2): 5000 [IU] via SUBCUTANEOUS
  Filled 2012-02-12 (×5): qty 1

## 2012-02-12 MED ORDER — HEPARIN (PORCINE) IN NACL 2-0.9 UNIT/ML-% IJ SOLN
INTRAMUSCULAR | Status: AC
  Filled 2012-02-12: qty 2000

## 2012-02-12 MED ORDER — ISOSORBIDE MONONITRATE ER 60 MG PO TB24
60.0000 mg | ORAL_TABLET | Freq: Every day | ORAL | Status: DC
  Administered 2012-02-12 – 2012-02-13 (×2): 60 mg via ORAL
  Filled 2012-02-12 (×2): qty 1

## 2012-02-12 MED ORDER — LIDOCAINE HCL (PF) 1 % IJ SOLN
INTRAMUSCULAR | Status: AC
  Filled 2012-02-12: qty 30

## 2012-02-12 MED ORDER — NITROGLYCERIN 0.2 MG/ML ON CALL CATH LAB
INTRAVENOUS | Status: AC
  Filled 2012-02-12: qty 1

## 2012-02-12 NOTE — Progress Notes (Signed)
ANTICOAGULATION CONSULT NOTE - Follow Up Consult  Pharmacy Consult for Coumadin Indication: pulmonary embolus (in 2005)  Allergies  Allergen Reactions  . Ezetimibe-Simvastatin     REACTION: myalgia: TOLERATES PRAVOCHOL OK    Patient Measurements: Height: 5\' 10"  (177.8 cm) Weight: 219 lb 5.7 oz (99.5 kg) IBW/kg (Calculated) : 73   Vital Signs: Temp: 97.5 F (36.4 C) (05/20 1150) BP: 135/73 mmHg (05/20 1150) Pulse Rate: 73  (05/20 1202)  Labs:  Basename 02/12/12 0557 02/12/12 0237 02/12/12 0106 02/11/12 2056 02/11/12 1504 02/11/12 1030  HGB 13.2 -- -- -- -- 13.5  HCT 39.1 -- -- -- -- 38.4*  PLT 135* -- -- -- -- 163  APTT -- -- -- -- -- 31  LABPROT 20.3* -- -- -- -- 20.7*  INR 1.70* -- -- -- -- 1.74*  HEPARINUNFRC -- -- 0.47 -- -- --  CREATININE 1.91* -- -- -- -- 1.90*  CKTOTAL -- 127 -- 134 125 --  CKMB -- 4.8* -- 5.5* 5.8* --  TROPONINI -- 0.72* -- 1.80* 1.56* --    Estimated Creatinine Clearance: 41.9 ml/min (by C-G formula based on Cr of 1.91).  Assessment:   IV heparin off for cath today, now changing to low-dose SQ heparin to begin tonight.  Coumadin to resume today. Alvan Dame at home:  3 mg daily,  Last dose 5/18. On Coumadin since 2005, no questions about monitoring, precautions, or interactions.   Did not tolerate generic warfarin in the past, but name brand Coumadin okay.  Goal of Therapy:  INR 2-3  Plan:    Coumadin 4 mg PO tonight.   Daily PT/INR.    Nicolette Bang Donovan,RPh Pager: [phone] 02/12/2012,2:27 PM

## 2012-02-12 NOTE — Progress Notes (Signed)
Pt had 9 bts NSVT.  Asymptomatic/No complaints voiced. Strip posted /will continue to monitor. Joseph Hamilton

## 2012-02-12 NOTE — Progress Notes (Signed)
  Echocardiogram 2D Echocardiogram has been performed.  Joseph Hamilton 02/12/2012, 5:27 PM

## 2012-02-12 NOTE — Progress Notes (Signed)
ANTICOAGULATION CONSULT NOTE   Pharmacy Consult for Heparin Indication: chest pain/ACS and pulmonary embolus  Allergies  Allergen Reactions  . Ezetimibe-Simvastatin     REACTION: myalgia: TOLERATES PRAVOCHOL OK    Patient Measurements: Height: 5\' 10"  (177.8 cm) Weight: 220 lb (99.791 kg) IBW/kg (Calculated) : 73  Heparin Dosing Weight: 93.8 kg  Vital Signs: Temp: 98.1 F (36.7 C) (05/19 2100) BP: 114/71 mmHg (05/19 2100) Pulse Rate: 64  (05/19 2100)  Labs:  Basename 02/12/12 0106 02/11/12 2056 02/11/12 1504 02/11/12 1030  HGB -- -- -- 13.5  HCT -- -- -- 38.4*  PLT -- -- -- 163  APTT -- -- -- 31  LABPROT -- -- -- 20.7*  INR -- -- -- 1.74*  HEPARINUNFRC 0.47 -- -- --  CREATININE -- -- -- 1.90*  CKTOTAL -- 134 125 105  CKMB -- 5.5* 5.8* 3.4  TROPONINI -- 1.80* 1.56* <0.30    Estimated Creatinine Clearance: 42.2 ml/min (by C-G formula based on Cr of 1.9).  Assessment: 71 yo male with ACS, h/o PE, for Heparin.    Goal of Therapy:  Heparin level 0.3-0.7 units/ml Monitor platelets by anticoagulation protocol: Yes   Plan:  Continue Heparin at current rate F/U plan for cath  Geannie Risen, PharmD, BCPS  02/12/2012, 2:07 AM

## 2012-02-12 NOTE — Progress Notes (Signed)
Utilization review completed.  

## 2012-02-12 NOTE — H&P (View-Only) (Signed)
Patient ID: Joseph Hamilton, male   DOB: 05/18/1941, 70 y.o.   MRN: [id number]   Patient ID: Joseph Hamilton MRN: [id number], DOB/AGE: 06/17/1941   Admit date: 02/11/2012   Primary Physician: Hodgin Jr, Germain Koopmann Whitson, MD, MD Primary Cardiologist: Dan Bensimhon , Steve Klein  Pt. Profile: Joseph Hamilton is a 70-year-old white male admitted with symptoms consistent with his previous myocardial infarctions. Problem List  Past Medical History  Diagnosis Date  . Coronary artery disease     s/p MI X 2, REDO CABG 2003, MYOVIEW 12/09 EF 28% , EXTENSIVE ANTERIOR, INFERIOR AND LATERAL INFARCT. VERY MILD ANTERIOR ISCHEMIA  . CHF (congestive heart failure)     SECONDARY TO ISCHEMIC CARDIOMYOPATHY ; EF 28% BY MYOVIEW ; 50% BY ECHO 07/2008; ECHO 09/2009 EF "MILD TO MODERATELY REDUCED"  . Hypertension   . Hyperlipidemia   . Dizziness     CHRONIC; s/p EXTENSIVE EVAL, s/p EVAL @ [facility]; HEADACHE:  s/p EXTENSIVE EVAL; ON ELAVIL  . Sleep apnea 03/31/10    UNABLE TO TOLERATE CPAP; > HOME SLEEP TEST AHI 15.6  . Obesity   . Dupuytren contracture   . Nephrolithiasis   . CRI (chronic renal insufficiency)     BASELINE Cr. 1.7 ----DR. MATTINGLY  . Carotid artery occlusion     0-39% BILATERALLY W/CHRONICALLY OCCLUDED L VERTEBRAL  . Myocardial infarction   . Angina   . ICD (implantable cardiac defibrillator) in place   . Pacemaker   . PE (pulmonary embolism) 2005    Past Surgical History  Procedure Date  . Insert / replace / remove pacemaker 2006    s/p GUIDANT IMPLANTABLE CARDIVERTER DEFIBRILLATOR  . Coronary artery bypass graft 1994    1994 AND 2003  . Cataract extraction   . Tonsillectomy     AS A CHILD  . Kidney stone surgery      Allergies  Allergies  Allergen Reactions  . Ezetimibe-Simvastatin     REACTION: myalgia: TOLERATES PRAVOCHOL OK    HPI He awoke this morning with heaviness in feeling tired across his back. He had some tingling in his left arm. It was worsened with activity. He did not  have any sublingual nitroglycerin. He went to the emergency room where he had no acute EKG changes and his initial enzymes were negative. His discomfort went away with rest.  Yesterday was a normal day. Denying any orthopnea, PND or edema. He's had no presyncope or syncope. He's had no palpitations. He denies any fever, chills, or sweats. He's had no cough  He does have chronic chest pain from severe keloid formation from his previous 2 bypass procedures. This was very different.  Of note his creatinine is 1.9. He's on ACE , spironolactone, and Lasix. He is a non-insulin requiring diabetic.  He is currently symptom-free on arrival to the hospital.   Home Medications  Prior to Admission medications   Medication Sig Start Date End Date Taking? Authorizing Provider  acetaminophen (TYLENOL) 500 MG tablet Take 500 mg by mouth every 6 (six) hours as needed. For pain   Yes Historical Provider, MD  amitriptyline (ELAVIL) 25 MG tablet Take 25 mg by mouth at bedtime.   Yes Historical Provider, MD  aspirin 81 MG EC tablet Take 81 mg by mouth daily.     Yes Historical Provider, MD  carvedilol (COREG) 25 MG tablet Take 12.5 mg by mouth 2 (two) times daily with a meal.   Yes Historical Provider, MD  furosemide (LASIX) 40 MG tablet Take   40 mg by mouth daily.   Yes Historical Provider, MD  nateglinide (STARLIX) 60 MG tablet Take 60 mg by mouth 2 (two) times daily with a meal. Take with the two largest meals of the day   Yes Historical Provider, MD  omeprazole (PRILOSEC) 20 MG capsule Take 20 mg by mouth daily.   Yes Historical Provider, MD  ramipril (ALTACE) 10 MG capsule Take 1 capsule (10 mg total) by mouth daily. 02/21/11  Yes Daniel R Bensimhon, MD  rosuvastatin (CRESTOR) 40 MG tablet Take 40 mg by mouth daily.   Yes Historical Provider, MD  spironolactone (ALDACTONE) 25 MG tablet Take 12.5 mg by mouth daily.   Yes Historical Provider, MD  warfarin ([name]) 3 MG tablet Take 3 mg by mouth daily.   Yes  Historical Provider, MD  zolpidem (AMBIEN CR) 12.5 MG CR tablet Take 12.5 mg by mouth at bedtime.   Yes Historical Provider, MD    Family History  No family history on file.  Social History  History   Social History  . Marital Status: Divorced    Spouse Name: N/A    Number of Children: N/A  . Years of Education: N/A   Occupational History  . RETIRED     WORKS PART TIME   Social History Main Topics  . Smoking status: Former Smoker    Types: Pipe    Quit date: 09/25/1993  . Smokeless tobacco: Never Used  . Alcohol Use: Yes     1 DRINK PER DAY  . Drug Use: No  . Sexually Active: Not on file   Other Topics Concern  . Not on file   Social History Narrative   DIVORCEDLIVES BY HIMSELF3 [place]DR[place] 1995ETOH 1 A DAYNO EXERCISEICD-[place] SCIENTIFIC...REMOTE YES     Review of Systems General:  No chills, fever, night sweats or weight changes.  Cardiovascular:  No chest pain, dyspnea on exertion, edema, orthopnea, palpitations, paroxysmal nocturnal dyspnea. Dermatological: No rash, lesions/masses Respiratory: No cough, dyspnea Urologic: No hematuria, dysuria Abdominal:   No nausea, vomiting, diarrhea, bright red blood per rectum, melena, or hematemesis Neurologic:  No visual changes, wkns, changes in mental status. All other systems reviewed and are otherwise negative except as noted above.  Physical Exam  Blood pressure 130/82, pulse 67, temperature 97.8 F (36.6 C), temperature source Oral, resp. rate 18, SpO2 100.00%.  General: Pleasant, NAD Psych: Normal affect. Neuro: Alert and oriented X 3. Moves all extremities spontaneously. HEENT: Normal  Neck: Supple without bruits or JVD. Lungs:  Resp regular and unlabored, CTA. Heart: RRR no s3, s4, or murmurs. Abdomen: Soft, non-tender, non-distended, BS + x 4.  Extremities: No clubbing, cyanosis or edema. DP/PT/Radials 1+ and equal bilaterally. Chest with severe keloid.   Labs   Basename 02/11/12 1030  CKTOTAL 105  CKMB 3.4  TROPONINI <0.30   Lab Results  Component Value Date   WBC 6.6 02/11/2012   HGB 13.5 02/11/2012   HCT 38.4* 02/11/2012   MCV 92.3 02/11/2012   PLT 163 02/11/2012    Lab 02/11/12 1030  NA 138  K 5.0  CL 104  CO2 23  BUN 49*  CREATININE 1.90*  CALCIUM 9.9  PROT 7.5  BILITOT 0.4  ALKPHOS 46  ALT 22  AST 16  GLUCOSE 130*   Lab Results  Component Value Date   CHOL 146 01/25/2012   HDL 40.80 01/25/2012   LDLCALC 41 09/05/2010   TRIG 286.0* 01/25/2012   Lab Results    Component Value Date   DDIMER 0.26 11/20/2011     Radiology/Studies  Dg Chest 2 View  02/11/2012  *RADIOLOGY REPORT*  Clinical Data: Chest pain.  Shortness of breath.  CHEST - 2 VIEW  Comparison: No priors.  Findings: Lung volumes are normal. There is an ill defined opacity projecting over the right lower lobe, with some associated bronchial Lilleigh Hechavarria thickening.  No pleural effusions.  There is mild pulmonary venous congestion without frank pulmonary edema. Heart size is mildly enlarged with prominence of the left ventricular contour which suggests underlying left ventricular hypertrophy. The patient is rotated to the left on today's exam, resulting in distortion of the mediastinal contours and reduced diagnostic sensitivity and specificity for mediastinal pathology. Atherosclerotic calcifications in the arch of the aorta. Status post median sternotomy for CABG with a LIMA.  A left-sided pacemaker/AICD is in place with lead tips projecting over the expected location of the right atrial appendage and right ventricular apex.  IMPRESSION: 1.  Ill-defined opacity in the right lower lobe with associated bronchial Dracen Reigle thickening.  This could represent an early bronchopneumonia or sequelae of mild aspiration.  Clinical correlation is recommended. 2.  Pulmonary venous congestion without frank pulmonary edema. 3.  Mild cardiomegaly with evidence to suggest left ventricular hypertrophy.  4.  Atherosclerosis. 5.  Postoperative changes and support apparatus, as above.  Original Report Authenticated By: DANIEL W. ENTRIKIN, M.D.    ECG  NORMAL SINUS RHYTHM, POOR R-WAVE PROGRESSION ANTERIOR PRECORDIUM CONSISTENT WITH PREVIOUS ANTERIOR SEPTAL AND LATERAL Eiko Mcgowen INFARCT.   ASSESSMENT AND PLAN  Joseph Lagan presents with recurrent symptoms consistent with previous acute coronary syndromes. Relieved with rest. He is high risk coronary anatomy with previous anterior septal and lateral Glynda Soliday infarct not to mention bypass surgery twice. He does a lot of keloid formation. I suspect his ejection fraction  Is in the 30-35 range. His renal insufficiency will preclude significant dye load.  We'll start IV heparin with his INR is subtherapeutic. We'll gently hydrate with 50 cc of half-normal saline. We'll check a metabolic profile in the morning. We'll hold Lasix, spironolactone, and ramipril.  Indication, risks, and potential benefit discussed with the patient and his daughter. They agree to proceed.   Signed, Greta Yung, MD 02/11/2012, @NOW  

## 2012-02-12 NOTE — CV Procedure (Signed)
Cardiac Catheterization Procedure Note  Name: Joseph Hamilton MRN: [id number] DOB: June 02, 1941  Procedure: Right Heart Cath, Left Heart Cath, Selective Coronary Angiography, saphenous vein graft angiography, LIMA graft angiography.  Indication: Patient is a 71 year old white male with history of coronary disease and ischemic cardiomyopathy who presents with a non-ST elevation myocardial infarction. He had original coronary bypass surgery in 1994. The grafts at that time were described as a vein graft to the diagonal, vein graft to the posterior lateral branch of the circumflex, and vein graft to the PDA. He had redo coronary bypass surgery in 2003. This included a saphenous vein graft to the ramus, saphenous vein graft to the OM, and LIMA graft to the LAD.   Procedural Details: The right groin was prepped, draped, and anesthetized with 1% lidocaine. Using the modified Seldinger technique a 5 French sheath was placed in the right femoral artery and a 7 French sheath was placed in the right femoral vein. A Swan-Ganz catheter was used for the right heart catheterization. Standard protocol was followed for recording of right heart pressures and sampling of oxygen saturations. Fick cardiac output was calculated. Standard Judkins catheters were used for selective coronary angiography and left ventriculography. There were no immediate procedural complications. The patient was transferred to the post catheterization recovery area for further monitoring. 75 cc of contrast were used.  Procedural Findings: Hemodynamics RA 9/10 with a mean of 8 mmHg  RV 33/10 mmHg PA 35/19 with a mean of 26 mmHg PCWP 16/15 with a mean of 14 mmHg LV 131/14 mmHg AO 123/69 with a mean of 93 mmHg  Oxygen saturations: PA 65% AO 93%  Cardiac Output (Fick) 5.77 L per minute  Cardiac Index (Fick) 2.65 L per minute per meter square   Coronary angiography: Coronary dominance: right  Left mainstem: The left main is heavily  calcified. There is a 30% tapering narrowing in the distal left main.  Left anterior descending (LAD): The LAD is occluded after the first septal perforator. It is also heavily calcified.  Left circumflex (LCx): The left circumflex is also calcified. There is a 90% ostial stenosis. There is a first marginal branch with a 80-90% proximal stenosis. This then bifurcates. The smaller, more anterior branch has a 90% stenosis. The more lateral branch has irregularities less than 30-40%. The continuation of the left circumflex is occluded and there are faint left to left collaterals.  Right coronary artery (RCA): There is a 90% ostial stenosis was severe dampening of pressures with catheter engagement. The right coronary is then occluded in the mid vessel after the the first right ventricular marginal branch. The RV marginal branch has an 80% stenosis proximally.  There are 3 bypass markers in the proximal aorta. The  anterior 2 graft markers reveal occluded grafts.   the third marker reveals a saphenous vein graft to the distal right coronary. This graft is patent. In the mid graft there is a long 50% stenosis. The distal graft has an aneurysmal segment. The PDA has an 80% stenosis in the far distal vessel.  The LIMA graft to the LAD is a large graft which is widely patent. Is appears to insert into the mid LAD. After the takeoff of the second diagonal the LAD is occluded. There are left to left collaterals to the first diagonal.   Left ventriculography:  not performed to conserve dye load   Final Conclusions:  1. Severe 3 vessel obstructive coronary disease. 2. Patent saphenous vein graft to the distal RCA  3. Patent LIMA graft to the mid LAD supplying the second diagonal branch. 4. Other saphenous vein grafts are occluded. These are presumably the saphenous vein graft to the obtuse marginal vessel and what was called a ramus branch. 5. Normal right heart pressures.   Recommendations: I would  recommend aggressive antianginal medical therapy at this time. The only potential vessel for intervention would be the ostial circumflex and first marginal branch. This is a very complex and heavily calcified vessel. I would not recommend intervention unless the patient had failed aggressive medical therapy.    Thedora Hinders, [facility] 02/12/2012, 1:05 PM

## 2012-02-12 NOTE — Interval H&P Note (Signed)
History and Physical Interval Note:  02/12/2012 12:11 PM  Joseph Hamilton  has presented today for surgery, with the diagnosis of cp  The various methods of treatment have been discussed with the patient and family. After consideration of risks, benefits and other options for treatment, the patient has consented to  Procedure(s) (LRB): LEFT AND RIGHT HEART CATHETERIZATION WITH CORONARY/GRAFT ANGIOGRAM (N/A) as a surgical intervention .  The patients' history has been reviewed, patient examined, no change in status, stable for surgery.  I have reviewed the patients' chart and labs.  Questions were answered to the patient's satisfaction.     Theron Arista [facility] 02/12/2012 12:11 PM

## 2012-02-13 DIAGNOSIS — I5022 Chronic systolic (congestive) heart failure: Secondary | ICD-10-CM

## 2012-02-13 DIAGNOSIS — I2589 Other forms of chronic ischemic heart disease: Secondary | ICD-10-CM

## 2012-02-13 DIAGNOSIS — I255 Ischemic cardiomyopathy: Secondary | ICD-10-CM

## 2012-02-13 DIAGNOSIS — I2510 Atherosclerotic heart disease of native coronary artery without angina pectoris: Secondary | ICD-10-CM

## 2012-02-13 DIAGNOSIS — I214 Non-ST elevation (NSTEMI) myocardial infarction: Secondary | ICD-10-CM

## 2012-02-13 LAB — BASIC METABOLIC PANEL
BUN: 37 mg/dL — ABNORMAL HIGH (ref 6–23)
GFR calc Af Amer: 43 mL/min — ABNORMAL LOW (ref >90)
GFR calc non Af Amer: 37 mL/min — ABNORMAL LOW (ref >90)
Potassium: 4.3 mEq/L (ref 3.5–5.1)

## 2012-02-13 LAB — PROTIME-INR: Prothrombin Time: 17.7 seconds — ABNORMAL HIGH (ref 11.6–15.2)

## 2012-02-13 MED ORDER — ISOSORBIDE MONONITRATE ER 60 MG PO TB24: 60.0000 mg | ORAL_TABLET | Freq: Every day | ORAL | Status: DC

## 2012-02-13 MED ORDER — CARVEDILOL 25 MG PO TABS: 25.0000 mg | ORAL_TABLET | Freq: Two times a day (BID) | ORAL | Status: DC

## 2012-02-13 MED ORDER — NITROGLYCERIN 0.4 MG SL SUBL: 0.4000 mg | SUBLINGUAL_TABLET | SUBLINGUAL | Status: DC | PRN

## 2012-02-13 NOTE — Progress Notes (Signed)
Was notified about pt being NSTEMI and needing CRPII. Discussed with pt restrictions, gave diet, encouraged to ex, discussed NTG and CRPII. Requests his name be sent to [facility]. Voiced understanding of education but sts he knows it all and does not follow most of it.  [id number] Ethelda Chick CES, ACSM

## 2012-02-13 NOTE — Discharge Summary (Signed)
Discharge Summary   Patient ID: Joseph Hamilton,  MRN: [id number], DOB/AGE: Jan 19, 1941 71 y.o.  Admit date: 02/11/2012 Discharge date: 02/13/2012  Discharge Diagnoses Principal Problem:  1. NSTEMI (non-ST elevated myocardial infarction):    Ref. Range 02/11/2012 10:30 02/11/2012 15:04 02/11/2012 20:56 02/12/2012 02:37  CK, MB Latest Range: 0.3-4.0 ng/mL 3.4 5.8 (H) 5.5 (H) 4.8 (H)  CK Total Latest Range: 7-232 U/L 105 125 134 127  Troponin I Latest Range: <0.30 ng/mL <0.30 1.56 (HH) 1.80 (HH) 0.72 (HH)     - Cardiac cath 02/12/12: see full report below; SVG-OM and ramus occluded, complex and heavily calcified vessels, not amenable to PCI  - Aggressive medical management recommended  - Stable post-cath  - Chest pain controlled on Imdur  Active Problems:  2. ISCHEMIC CARDIOMYOPATHY:  - ACEi/spironolactone and Lasix held on admission secondary to renal insufficiency  - pBNP mildly elevated   - Euvolemic throughout admission  - EF unable to be assessed on echo 02/12/12 due to poor acoustic windows, grade 1 diastolic dysfunction noted  - Will continue ACEi/Lasix on discharge  - Aldactone held until follow-up for renal re-assessment  - Follow-up in the heart failure clinic with Dr. Gala Romney on 02/22/12  - Supplemental heart failure information provided  3.  CAD, AUTOLOGOUS BYPASS GRAFT:  - Medical management recommended  - Imdur started, Coreg up-titrated, ASA and statin, NTG SL PRN on discharge  4. EMBOLISM/INFARCTION, PULMONARY NEC:  - INR subtherapeutic at 1.74 on admission; started on heparin inpatient, managed by pharmacy  - Will continue prior Coumadin dosing on discharge  - Coumadin clinic follow-up on 02/16/12   5. RENAL INSUFFICIENCY:  - Cr 1.90 on admission  - Gently hydrated   - Cr 1.77 today, consistent with baseline  6. DIABETES MELLITUS, TYPE II:  - Stable, continued on outpatient nateglinide  7. HYPERLIPIDEMIA-MIXED:  - Stable, continued on statin  equivalent  8. GERD:  - Stable, continued on PPI  9. FATIGUE:  - Chronic; patient also has insomnia at baseline which was exacerbated while inpatient  Allergies Allergies  Allergen Reactions  . Ezetimibe-Simvastatin     REACTION: myalgia: TOLERATES PRAVOCHOL OK    Diagnostic Studies/Procedures  CHEST - 2 VIEW- 02/11/12   Comparison: No priors.  Findings: Lung volumes are normal. There is an ill defined opacity  projecting over the right lower lobe, with some associated  bronchial wall thickening. No pleural effusions. There is mild  pulmonary venous congestion without frank pulmonary edema. Heart  size is mildly enlarged with prominence of the left ventricular  contour which suggests underlying left ventricular hypertrophy. The  patient is rotated to the left on today's exam, resulting in  distortion of the mediastinal contours and reduced diagnostic  sensitivity and specificity for mediastinal pathology.  Atherosclerotic calcifications in the arch of the aorta. Status  post median sternotomy for CABG with a LIMA. A left-sided  pacemaker/AICD is in place with lead tips projecting over the  expected location of the right atrial appendage and right  ventricular apex.  IMPRESSION:  1. Ill-defined opacity in the right lower lobe with associated  bronchial wall thickening. This could represent an early  bronchopneumonia or sequelae of mild aspiration. Clinical  correlation is recommended.  2. Pulmonary venous congestion without frank pulmonary edema.  3. Mild cardiomegaly with evidence to suggest left ventricular  hypertrophy.  4. Atherosclerosis.  5. Postoperative changes and support apparatus, as above.  CARDIAC CATHETERIZATION- 02/12/12  Procedure: Right Heart Cath, Left Heart Cath, Selective Coronary  Angiography, saphenous vein graft angiography, LIMA graft angiography.  Indication: Patient is a 71 year old white male with history of coronary disease and ischemic  cardiomyopathy who presents with a non-ST elevation myocardial infarction. He had original coronary bypass surgery in 1994. The grafts at that time were described as a vein graft to the diagonal, vein graft to the posterior lateral branch of the circumflex, and vein graft to the PDA. He had redo coronary bypass surgery in 2003. This included a saphenous vein graft to the ramus, saphenous vein graft to the OM, and LIMA graft to the LAD.  Procedural Details: The right groin was prepped, draped, and anesthetized with 1% lidocaine. Using the modified Seldinger technique a 5 French sheath was placed in the right femoral artery and a 7 French sheath was placed in the right femoral vein. A Swan-Ganz catheter was used for the right heart catheterization. Standard protocol was followed for recording of right heart pressures and sampling of oxygen saturations. Fick cardiac output was calculated. Standard Judkins catheters were used for selective coronary angiography and left ventriculography. There were no immediate procedural complications. The patient was transferred to the post catheterization recovery area for further monitoring. 75 cc of contrast were used.  Procedural Findings:  Hemodynamics  RA 9/10 with a mean of 8 mmHg  RV 33/10 mmHg  PA 35/19 with a mean of 26 mmHg  PCWP 16/15 with a mean of 14 mmHg  LV 131/14 mmHg  AO 123/69 with a mean of 93 mmHg  Oxygen saturations:  PA 65%  AO 93%  Cardiac Output (Fick) 5.77 L per minute  Cardiac Index (Fick) 2.65 L per minute per meter square  Coronary angiography:  Coronary dominance: right  Left mainstem: The left main is heavily calcified. There is a 30% tapering narrowing in the distal left main.  Left anterior descending (LAD): The LAD is occluded after the first septal perforator. It is also heavily calcified.  Left circumflex (LCx): The left circumflex is also calcified. There is a 90% ostial stenosis. There is a first marginal branch with a 80-90%  proximal stenosis. This then bifurcates. The smaller, more anterior branch has a 90% stenosis. The more lateral branch has irregularities less than 30-40%. The continuation of the left circumflex is occluded and there are faint left to left collaterals.  Right coronary artery (RCA): There is a 90% ostial stenosis was severe dampening of pressures with catheter engagement. The right coronary is then occluded in the mid vessel after the the first right ventricular marginal branch. The RV marginal branch has an 80% stenosis proximally.  There are 3 bypass markers in the proximal aorta. The anterior 2 graft markers reveal occluded grafts.  the third marker reveals a saphenous vein graft to the distal right coronary. This graft is patent. In the mid graft there is a long 50% stenosis. The distal graft has an aneurysmal segment. The PDA has an 80% stenosis in the far distal vessel.  The LIMA graft to the LAD is a large graft which is widely patent. Is appears to insert into the mid LAD. After the takeoff of the second diagonal the LAD is occluded. There are left to left collaterals to the first diagonal.  Left ventriculography: not performed to conserve dye load  Final Conclusions:  1. Severe 3 vessel obstructive coronary disease.  2. Patent saphenous vein graft to the distal RCA  3. Patent LIMA graft to the mid LAD supplying the second diagonal branch.  4. Other saphenous vein  grafts are occluded. These are presumably the saphenous vein graft to the obtuse marginal vessel and what was called a ramus branch.  5. Normal right heart pressures.  Recommendations: I would recommend aggressive antianginal medical therapy at this time. The only potential vessel for intervention would be the ostial circumflex and first marginal branch. This is a very complex and heavily calcified vessel. I would not recommend intervention unless the patient had failed aggressive medical therapy.   2D ECHOCARDIOGRAM WITHOUT CONTRAST-  02/12/12  Study Conclusions  - Left ventricle: Very poor acoustic windows limit study. Overall LVEF appears at leastmoderately depressed with hypokinesis/akinesis in the anterior, inferior walls. Suggest repeat evaluation when patient can turn. Doppler parameters are consistent with abnormal left ventricular relaxation (grade 1 diastolic dysfunction). - Aortic valve: AV is thickened, calcified (moderate) with very mildly restricted motion. Mild regurgitation. Transthoracic echocardiography. M-mode, complete 2D, spectral Doppler, and color Doppler. Height: Height: 177.8cm. Height: 70in. Weight: Weight: 99.3kg. Weight: 218.5lb. Body mass index: BMI: 31.4kg/m^2. Body surface area: BSA: 2.11m^2. Patient status: Inpatient. Location: Bedside. ------------------------------------------------------------ ------------------------------------------------------------ Left ventricle: Very poor acoustic windows limit study. Overall LVEF appears at leastmoderately depressed with hypokinesis/akinesis in the anterior, inferior walls. Suggest repeat evaluation when patient can turn. Doppler parameters are consistent with abnormal left ventricular relaxation (grade 1 diastolic dysfunction). ------------------------------------------------------------ Aortic valve: AV is thickened, calcified (moderate) with very mildly restricted motion. Doppler: Mild regurgitation. ------------------------------------------------------------ Mitral valve: Calcified annulus. Mildly thickened leaflets . Doppler: Peak gradient: 3mm Hg (D). ------------------------------------------------------------ Right ventricle: The cavity size was normal. Wall thickness was normal. Pacer wire or catheter noted in right ventricle. Systolic function was normal. ------------------------------------------------------------ Tricuspid valve: Structurally normal valve. Leaflet separation was normal. Doppler: Transvalvular velocity  was within the normal range. No significant regurgitation. ------------------------------------------------------------ Right atrium: The atrium was normal in size. ------------------------------------------------------------ Pericardium: There was no pericardial effusion. ------------------------------------------------------------ 2D measurements Normal Doppler Normal Left ventricle measurements LVID ED, 53.9 mm 43-52 Left ventricle chord, Ea, lat 7.02 cm/ ------- PLAX ann, tiss s LVID ES, 46.4 mm 23-38 DP chord, E/Ea, lat 12.24 ------- PLAX ann, tiss FS, chord, 14 % >29 DP PLAX Ea, med 5.15 cm/ ------- LVPW, ED 9.43 mm ------ ann, tiss s IVS/LVPW 1.42 <1.3 DP ratio, ED E/Ea, med 16.68 ------- Vol ED, 215 ml ------ ann, tiss MOD1 DP Vol ES, 138 ml ------ Mitral valve MOD1 Peak E vel 85.9 cm/ ------- EF, MOD1 36 % ------ s Vol index, 99 ml/m^2 ------ Peak A vel 131 cm/ ------- ED, MOD1 s Vol index, 64 ml/m^2 ------ Deceleratio 219 ms 150-230 ES, MOD1 n time Ventricular septum Peak 3 mm ------- IVS, ED 13.4 mm ------ gradient, D Hg Aorta Peak E/A 0.7 ------- Root diam, 27 mm ------ ratio ED Tricuspid valve Left atrium Regurg peak 219 cm/ ------- AP dim 33 mm ------ vel s AP dim 1.52 cm/m^2 <2.2 Peak RV-RA 19 mm ------- index gradient, S Hg Vol, S 88 ml ------ Vol index, 40.6 ml/m^2 ------ S Right ventricle RVID ED, 37.1 mm 19-38 PLAX  History of Present Illness  Joseph Hamilton is a 71yo Caucasian male with PMHx significant for the above problem list who was admitted to [facility] on 02/11/12 for NSTEMI.   The patient awoke the day of admission with chest heaviness and fatigue. He did endorse L arm tingling which worsened with activity. He did not attempt to alleviate with NTG. The day prior, he denied similar chest pain, further ischemic, arrhythmic or heart failure-type symptoms. He did endorse  chronic chest discomfort associated with diffuse keloid scar  formation from his prior median sternotomy incisions.   He went to the emergency department. There were no acute EKG changes. Initial enzymes were negative. pBNP was mildly elevated. CXR as above. No leukocytosis. VSS. There was little clinical correlation to suggest bronchopneumonia or aspiration. BMET revealed Cr of 1.90, otherwise WNL. INR subtherapeutic, but the patient was felt to be at low-risk for PE. TSH was WNL. Given his high-risk status and features consistent with prior angina, he was subsequently admitted with anticipation for cardiac catheterization the following day.   Hospital Course   He was started on heparin IV. He was gently hydrated to optimize his renal function. Outpatient Lasix, spironolactoen and ramipril. The indication, risks and potential benefit was discussed with the patient and his daughter who agreed to proceed.   Cardiac biomarkers were cycled and found to be mildly elevated as above. Overnight, the patient did have NSVT. He was asymptomatic with this. The following day, the patient was informed, consented and prepped for cardiac cath. This was accessed via the R femoral artery. The full study is described above. Notably, this revealed severe 3-vessel obstructive coronary disease; patient SVG-distal RCA; LIMA-mid LAD; SVG-OM and ramus were occluded. These were found to be complex and heavily calcified vessels which were not amenable to PCI. There were normal right heart pressures. The recommendation was made to pursue medical management and initiate antianginal medical therapy. Dr. [place] noted that PCI would not be recommended unless the patient fails aggressive medical therapy. He tolerated the procedure well. An LV-gram was not obtained to reduce contrast exposure. 75 cc of contrast were used. He was transferred back to telemetry in good condition. He remained stable without acute events. A 2D echocardiogram was obtained to evaluate the patient's EF. This is described  above. The study was technically limited to quantify an EF. He was started on Imdur 60mg  daily. The patient's Coreg was increased to accommodate for BP control given the lack of Lasix, ACEi and aldactone which were held on admission.   Today, the patient ambulated well without incident. He did endorse some fatigue and insomnia while in the hospital. There were no episodes of chest pain. BMET this morning did reveal improved Cr. He is in good condition, and will be discharged home. He will continue medications started while inpatient. ACEi and Lasix will be continued. Aldactone will be held until further renal re-assessment has been made. He will continue prior Coumadin dosing. A follow-up appointment in the Coumadin clinic has been made later this week. Further adjustments will be based on that appointment. Cardiac rehab has been contacted to arrange outpatient follow-up for phase 2. He will follow-up with Dr. Gala Romney in the heart failure clinic as noted below. This information, including activity restrictions and supplemental heart failure information, is clearly outlined in the discharge AVS.   Discharge Vitals:  Blood pressure 118/71, pulse 73, temperature 98.3 F (36.8 C), temperature source Oral, resp. rate 18, height 5\' 10"  (1.778 m), weight 99.5 kg (219 lb 5.7 oz), SpO2 96.00%.   Labs: Recent Labs  [facility] 02/12/12 1516 02/12/12 0557   WBC 5.4 7.4   HGB 12.3* 13.2   HCT 36.3* 39.1   MCV 92.1 93.3   PLT 127* 135*    Lab 02/13/12 0910 02/12/12 1516 02/12/12 0557 02/11/12 1030  NA 135 -- 135 138  K 4.3 -- 4.1 5.0  CL 103 -- 100 104  CO2 19 -- 23 23  BUN  37* -- 45* 49*  CREATININE 1.77* 1.76* 1.91* --  CALCIUM 9.1 -- 9.3 9.9  PROT -- -- -- 7.5  BILITOT -- -- -- 0.4  ALKPHOS -- -- -- 46  ALT -- -- -- 22  AST -- -- -- 16  AMYLASE -- -- -- --  LIPASE -- -- -- --  GLUCOSE 137* -- 120* 130*   Recent Labs  [facility] 02/12/12 0237 02/11/12 2056 02/11/12 1504   CKTOTAL 127 134 125    CKMB 4.8* 5.5* 5.8*   CKMBINDEX -- -- --   TROPONINI 0.72* 1.80* 1.56*   Recent Labs  [facility] 02/12/12 0557   CHOL 146   HDL 38*   LDLCALC 61   TRIG 237*   CHOLHDL 3.8   LDLDIRECT --    Basename 02/11/12 1530  TSH 1.312  T4TOTAL --  T3FREE --  THYROIDAB --    Disposition:  Discharge Orders    Future Appointments: Provider: Department: Dept Phone: Center:   02/16/2012 10:45 AM [facility] [phone] None   02/22/2012 12:00 PM [facility] [phone] None   03/14/2012 11:30 AM Rachael Fee [facility] [phone] None   03/14/2012 12:00 PM Josph Macho, MD [facility] [phone] None   04/08/2012 3:00 PM Duke Salvia, MD [facility] [phone] [facility]   04/16/2012 1:30 PM Edwyna Perfect, MD [facility] [phone] [facility]     Follow-up Information    Follow up with [facility] on 02/16/2012. (At 10:45 AM for INR check. )    Contact information:   [address] [place] [address]       Follow up with Arvilla Meres, MD on 02/22/2012. (At 12:00 PM for follow-up after this hospitalization. )    Contact information:   [address] [place] [address] [phone]          Discharge Medications:  Medication List  As of 02/13/2012  2:22 PM   START taking these medications         isosorbide mononitrate 60 MG 24 hr tablet   Commonly known as: IMDUR   Take 1 tablet (60 mg total) by mouth daily.      nitroGLYCERIN 0.4 MG SL tablet   Commonly known as: NITROSTAT   Place 1 tablet (0.4 mg total) under the tongue every 5 (five) minutes x 3 doses as needed for chest pain.         CHANGE how you take these medications         carvedilol 25 MG tablet   Commonly known as: COREG   Take 1 tablet (25 mg total) by mouth 2 (two) times daily with a meal.   What changed: dose         CONTINUE taking these  medications         acetaminophen 500 MG tablet   Commonly known as: TYLENOL      amitriptyline 25 MG tablet   Commonly known as: ELAVIL      aspirin 81 MG EC tablet      furosemide 40 MG tablet   Commonly known as: LASIX      [name] 3 MG tablet   Generic drug: warfarin      nateglinide 60 MG tablet   Commonly known as: STARLIX      omeprazole 20 MG capsule   Commonly known as: PRILOSEC      ramipril 10 MG capsule  Commonly known as: ALTACE   Take 1 capsule (10 mg total) by mouth daily.      rosuvastatin 40 MG tablet   Commonly known as: CRESTOR      zolpidem 12.5 MG CR tablet   Commonly known as: AMBIEN CR         STOP taking these medications         spironolactone 25 MG tablet          Where to get your medications    These are the prescriptions that you need to pick up. We sent them to a specific pharmacy, so you will need to go there to get them.   [facility] [place], [place] - [address]    [address] [place] [address]    Phone: [phone]        carvedilol 25 MG tablet   isosorbide mononitrate 60 MG 24 hr tablet   nitroGLYCERIN 0.4 MG SL tablet           Outstanding Labs/Studies: INR check on 02/16/12  Duration of Discharge Encounter: Greater than 30 minutes including physician time.  Signed, R. Hurman Horn, PA-C 02/13/2012, 2:22 PM

## 2012-02-13 NOTE — Care Management Note (Signed)
    Page 1 of 1   02/13/2012     3:07:33 PM   CARE MANAGEMENT NOTE 02/13/2012  Patient:  Joseph Hamilton, Joseph Hamilton   Account Number:  [id number]  Date Initiated:  02/13/2012  Documentation initiated by:  GRAVES-BIGELOW,Lucious Zou  Subjective/Objective Assessment:   Pt admitted with cp. Plan home today.     Action/Plan:   No needs from CM at this time.   Anticipated DC Date:  02/13/2012   Anticipated DC Plan:  HOME/SELF CARE      DC Planning Services  CM consult      Choice offered to / List presented to:             Status of service:  Completed, signed off Medicare Important Message given?   (If response is "NO", the following Medicare IM given date fields will be blank) Date Medicare IM given:   Date Additional Medicare IM given:    Discharge Disposition:  HOME/SELF CARE  Per UR Regulation:  Reviewed for med. necessity/level of care/duration of stay  If discussed at Long Length of Stay Meetings, dates discussed:    Comments:

## 2012-02-13 NOTE — Discharge Summary (Signed)
Patient seen and examined and history reviewed. Agree with above findings and plan. See rounding note from earlier today.  Ashlen Kiger JordanMD 02/13/2012 2:37 PM     

## 2012-02-13 NOTE — Discharge Instructions (Signed)
PLEASE REMEMBER TO BRING ALL OF YOUR MEDICATIONS TO EACH OF YOUR FOLLOW-UP OFFICE VISITS.  PLEASE ATTEND ALL SCHEDULED APPOINTMENTS. PLEASE TAKE ALL NEW MEDICATIONS/MEDICATION CHANGES AS PRESCRIBED.   PLEASE RESUME COUMADIN AS PREVIOUSLY PRESCRIBED UNTIL FOLLOW-UP AT THE COUMADIN CLINIC ON 02/16/12.  Activity: Increase activity slowly as tolerated. You may shower, but no soaking baths for 6 days. No driving for 1 day. No lifting over 5 lbs for 6 days. No sexual activity for 6 days.   You May Return to Work: in 6 days (if applicable)  Wound Care: You may wash cath site gently with soap and water. Keep cath site clean and dry. If you notice pain, swelling, bleeding or pus at your cath site, please call [phone].   Heart Failure Heart failure (HF) is a condition in which the heart has trouble pumping blood. This means your heart does not pump blood efficiently for your body to work well. In some cases of HF, fluid may back up into your lungs or you may have swelling (edema) in your lower legs. HF is a long-term (chronic) condition. It is important for you to take good care of yourself and follow your caregiver's treatment plan. CAUSES   Health conditions:   High blood pressure (hypertension) causes the heart muscle to work harder than normal. When pressure in the blood vessels is high, the heart needs to pump (contract) with more force in order to circulate blood throughout the body. High blood pressure eventually causes the heart to become stiff and weak.   Coronary artery disease (CAD) is the buildup of cholesterol and fat (plaques) in the arteries of the heart. The blockage in the arteries deprives the heart muscle of oxygen and blood. This can cause chest pain and may lead to a heart attack. High blood pressure can also contribute to CAD.   Heart attack (myocardial infarction) occurs when 1 or more arteries in the heart become blocked. The loss of oxygen damages the muscle tissue of the heart.  When this happens, part of the heart muscle dies. The injured tissue does not contract as well and weakens the heart's ability to pump blood.   Abnormal heart valves can cause HF when the heart valves do not open and close properly. This makes the heart muscle pump harder to keep the blood flowing.   Heart muscle disease (cardiomyopathy or myocarditis) is damage to the heart muscle from a variety of causes. These can include drug or alcohol abuse, infections, or unknown reasons. These can increase the risk of HF.   Lung disease makes the heart work harder because the lungs do not work properly. This can cause a strain on the heart leading it to fail.   Diabetes increases the risk of HF. High blood sugar contributes to high fat (lipid) levels in the blood. Diabetes can also cause slow damage to tiny blood vessels that carry important nutrients to the heart muscle. When the heart does not get enough oxygen and food, it can cause the heart to become weak and stiff. This leads to a heart that does not contract efficiently.   Other diseases can contribute to HF. These include abnormal heart rhythms, thyroid problems, and low blood counts (anemia).   Unhealthy lifestyle habits:   Obesity.   Smoking.   Eating foods high in fat and cholesterol.   Eating or drinking beverages high in salt.   Drug or alcohol abuse.   Lack of exercise.  SYMPTOMS  HF symptoms may vary and  can be hard to detect. Symptoms may include:  Shortness of breath with activity, such as climbing stairs.   Persistent cough.   Swelling of the feet, ankles, legs, or abdomen.   Unexplained weight gain.   Difficulty breathing when lying flat.   Waking from sleep because of the need to sit up and get more air.   Rapid heartbeat.   Fatigue and loss of energy.   Feeling lightheaded or close to fainting.  DIAGNOSIS  A diagnosis of HF is based on your history, symptoms, physical examination, and diagnostic  tests. Diagnostic tests for HF may include:  EKG.   Chest X-ray.   Blood tests.   Exercise stress test.   Blood oxygen test (arterial blood gas).   Evaluation by a heart doctor (cardiologist).   Ultrasound evaluation of the heart (echocardiogram).   Heart artery test to look for blockages (angiogram).   Radioactive imaging to look at the heart (radionuclide test).  TREATMENT  Treatment is aimed at managing the symptoms of HF. Medicines, lifestyle changes, or surgical intervention may be necessary to treat HF.  Medicines to help treat HF may include:   Angiotensin-converting enzyme (ACE) inhibitors. These block the effects of a blood protein called angiotensin-converting enzyme. ACE inhibitors relax (dilate) the blood vessels and help lower blood pressure. This decreases the workload of the heart, slows the progression of HF, and improves symptoms.   Angiotensin receptor blockers (ARBs). These medications work similar to ACE inhibitors. ARBs may be an alternative for people who cannot tolerate an ACE inhibitor.   Aldosterone antagonists. This medication helps get rid of extra fluid from your body. This lowers the volume of blood the heart has to pump.   Water pills (diuretics). Diuretics cause the kidneys to remove salt and water from the blood. The extra fluid is removed by urination. By removing extra fluid from the body, diuretics help lower the workload of the heart and help prevent fluid buildup in the lungs so breathing is easier.   Beta blockers. These prevent the heart from beating too fast and improve heart muscle strength. Beta blockers help maintain a normal heart rate, control blood pressure, and improve HF symptoms.   Digitalis. This increases the force of the heartbeat and may be helpful to people with HF or heart rhythm problems.   Healthy lifestyle changes include:   Stopping smoking.   Eating a healthy diet. Avoid foods high in fat. Avoid foods fried in oil or  made with fat. A dietician can help with healthy food choices.   Limiting how much salt you eat.   Limiting alcohol intake to no more than 1 drink per day for women and 2 drinks per day for men. Drinking more than that is harmful to your heart. If your heart has already been damaged by alcohol or you have severe HF, drinking alcohol should be stopped completely.   Exercising as directed by your caregiver.   Surgical treatment for HF may include:   Procedures to open blocked arteries, repair damaged heart valves, or remove damaged heart muscle tissue.   A pacemaker to help heart muscle function and to control certain abnormal heart rhythms.   A defibrillator to possibly prevent sudden cardiac death.  HOME CARE INSTRUCTIONS   Activity level. Your caregiver can help you determine what type of exercise program may be helpful. It is important to maintain your strength. Pace your physical activity to avoid shortness of breath or chest pain. Rest for 1 hour before  and after meals. A cardiac rehabilitation program may be helpful to some people with HF.   Diet. Eat a heart healthy diet. Food choices should be low in saturated fat and cholesterol. Talk to a dietician to learn about heart healthy foods.   Salt intake. When you have HF, you need to limit the amount of salt you eat. Eat less than 1500 milligrams (mg) of salt per day or as recommended by your caregiver.   Weight monitoring. Weigh yourself every day. You should weigh yourself in the morning after you urinate and before you eat breakfast. Wear the same amount of clothing each time you weigh yourself. Record your weight daily. Bring your recorded weights to your clinic visits. Tell your caregiver right away if you have gained 3 lb/1.4 kg in 1 day, or 5 lb/2.3 kg in a week or whatever amount you were told to report.   Blood pressure monitoring. This should be done as directed by your caregiver. A home blood pressure cuff can be purchased at a  drugstore. Record your blood pressure numbers and bring them to your clinic visits. Tell your caregiver if you become dizzy or lightheaded upon standing up.   Smoking. If you are currently a smoker, it is time to quit. Nicotine makes your heart work harder by causing your blood vessels to constrict. Do not use nicotine gum or patches before talking to your caregiver.   Follow up. Be sure to schedule a follow-up visit with your caregiver. Keep all your appointments.  SEEK MEDICAL CARE IF:   Your weight increases by 3 lb/1.4 kg in 1 day or 5 lb/2.3 kg in a week.   You notice increasing shortness of breath that is unusual for you. This may happen during rest, sleep, or with activity.   You cough more than normal, especially with physical activity.   You notice more swelling in your hands, feet, ankles, or belly (abdomen).   You are unable to sleep because it is hard to breathe.   You cough up bloody mucus (sputum).   You begin to feel "jumping" or "fluttering" sensations (palpitations) in your chest.  SEEK IMMEDIATE MEDICAL CARE IF:   You have severe chest pain or pressure which may include symptoms such as:   Pain or pressure in the arms, neck, jaw, or back.   Feeling sweaty.   Feeling sick to your stomach (nauseous).   Feeling short of breath while at rest.   Having a fast or irregular heartbeat.   You experience stroke symptoms. These symptoms include:   Facial weakness or numbness.   Weakness or numbness in an arm, leg, or on one side of your body.   Blurred vision.   Difficulty talking or thinking.   Dizziness or fainting.   Severe headache.  THESE ARE MEDICAL EMERGENCIES. Do not wait to see if the symptoms go away. Call your local emergency services (911 in [place] DO NOT drive yourself to the hospital. IMPORTANT  Make a list of every medicine, vitamin, or herbal supplement you are taking. Keep the list with you at all times. Show it to your caregiver at every  visit. Keep the list up-to-date.   Ask your caregiver or pharmacist to write an explanation of each medicine you are taking. This should include:   Why you are taking it.   The possible side effects.   The best time of day to take it.   Foods to take with it or what foods to avoid.  When to stop taking it.  MAKE SURE YOU:   Understand these instructions.   Will watch your condition.   Will get help right away if you are not doing well or get worse.  Document Released: 09/11/2005 Document Revised: 08/31/2011 Document Reviewed: 12/24/2009 [facility] Patient Information 2012 [place], [place].   Heart Failure Heart failure (HF) is a condition in which the heart has trouble pumping blood. This means your heart does not pump blood efficiently for your body to work well. In some cases of HF, fluid may back up into your lungs or you may have swelling (edema) in your lower legs. HF is a long-term (chronic) condition. It is important for you to take good care of yourself and follow your caregiver's treatment plan. CAUSES   Health conditions:   High blood pressure (hypertension) causes the heart muscle to work harder than normal. When pressure in the blood vessels is high, the heart needs to pump (contract) with more force in order to circulate blood throughout the body. High blood pressure eventually causes the heart to become stiff and weak.   Coronary artery disease (CAD) is the buildup of cholesterol and fat (plaques) in the arteries of the heart. The blockage in the arteries deprives the heart muscle of oxygen and blood. This can cause chest pain and may lead to a heart attack. High blood pressure can also contribute to CAD.   Heart attack (myocardial infarction) occurs when 1 or more arteries in the heart become blocked. The loss of oxygen damages the muscle tissue of the heart. When this happens, part of the heart muscle dies. The injured tissue does not contract as well and weakens the  heart's ability to pump blood.   Abnormal heart valves can cause HF when the heart valves do not open and close properly. This makes the heart muscle pump harder to keep the blood flowing.   Heart muscle disease (cardiomyopathy or myocarditis) is damage to the heart muscle from a variety of causes. These can include drug or alcohol abuse, infections, or unknown reasons. These can increase the risk of HF.   Lung disease makes the heart work harder because the lungs do not work properly. This can cause a strain on the heart leading it to fail.   Diabetes increases the risk of HF. High blood sugar contributes to high fat (lipid) levels in the blood. Diabetes can also cause slow damage to tiny blood vessels that carry important nutrients to the heart muscle. When the heart does not get enough oxygen and food, it can cause the heart to become weak and stiff. This leads to a heart that does not contract efficiently.   Other diseases can contribute to HF. These include abnormal heart rhythms, thyroid problems, and low blood counts (anemia).   Unhealthy lifestyle habits:   Obesity.   Smoking.   Eating foods high in fat and cholesterol.   Eating or drinking beverages high in salt.   Drug or alcohol abuse.   Lack of exercise.  SYMPTOMS  HF symptoms may vary and can be hard to detect. Symptoms may include:  Shortness of breath with activity, such as climbing stairs.   Persistent cough.   Swelling of the feet, ankles, legs, or abdomen.   Unexplained weight gain.   Difficulty breathing when lying flat.   Waking from sleep because of the need to sit up and get more air.   Rapid heartbeat.   Fatigue and loss of energy.   Feeling lightheaded  or close to fainting.  DIAGNOSIS  A diagnosis of HF is based on your history, symptoms, physical examination, and diagnostic tests. Diagnostic tests for HF may include:  EKG.   Chest X-ray.   Blood tests.   Exercise stress test.   Blood  oxygen test (arterial blood gas).   Evaluation by a heart doctor (cardiologist).   Ultrasound evaluation of the heart (echocardiogram).   Heart artery test to look for blockages (angiogram).   Radioactive imaging to look at the heart (radionuclide test).  TREATMENT  Treatment is aimed at managing the symptoms of HF. Medicines, lifestyle changes, or surgical intervention may be necessary to treat HF.  Medicines to help treat HF may include:   Angiotensin-converting enzyme (ACE) inhibitors. These block the effects of a blood protein called angiotensin-converting enzyme. ACE inhibitors relax (dilate) the blood vessels and help lower blood pressure. This decreases the workload of the heart, slows the progression of HF, and improves symptoms.   Angiotensin receptor blockers (ARBs). These medications work similar to ACE inhibitors. ARBs may be an alternative for people who cannot tolerate an ACE inhibitor.   Aldosterone antagonists. This medication helps get rid of extra fluid from your body. This lowers the volume of blood the heart has to pump.   Water pills (diuretics). Diuretics cause the kidneys to remove salt and water from the blood. The extra fluid is removed by urination. By removing extra fluid from the body, diuretics help lower the workload of the heart and help prevent fluid buildup in the lungs so breathing is easier.   Beta blockers. These prevent the heart from beating too fast and improve heart muscle strength. Beta blockers help maintain a normal heart rate, control blood pressure, and improve HF symptoms.   Digitalis. This increases the force of the heartbeat and may be helpful to people with HF or heart rhythm problems.   Healthy lifestyle changes include:   Stopping smoking.   Eating a healthy diet. Avoid foods high in fat. Avoid foods fried in oil or made with fat. A dietician can help with healthy food choices.   Limiting how much salt you eat.   Limiting alcohol  intake to no more than 1 drink per day for women and 2 drinks per day for men. Drinking more than that is harmful to your heart. If your heart has already been damaged by alcohol or you have severe HF, drinking alcohol should be stopped completely.   Exercising as directed by your caregiver.   Surgical treatment for HF may include:   Procedures to open blocked arteries, repair damaged heart valves, or remove damaged heart muscle tissue.   A pacemaker to help heart muscle function and to control certain abnormal heart rhythms.   A defibrillator to possibly prevent sudden cardiac death.  HOME CARE INSTRUCTIONS   Activity level. Your caregiver can help you determine what type of exercise program may be helpful. It is important to maintain your strength. Pace your physical activity to avoid shortness of breath or chest pain. Rest for 1 hour before and after meals. A cardiac rehabilitation program may be helpful to some people with HF.   Diet. Eat a heart healthy diet. Food choices should be low in saturated fat and cholesterol. Talk to a dietician to learn about heart healthy foods.   Salt intake. When you have HF, you need to limit the amount of salt you eat. Eat less than 1500 milligrams (mg) of salt per day or as recommended by  your caregiver.   Weight monitoring. Weigh yourself every day. You should weigh yourself in the morning after you urinate and before you eat breakfast. Wear the same amount of clothing each time you weigh yourself. Record your weight daily. Bring your recorded weights to your clinic visits. Tell your caregiver right away if you have gained 3 lb/1.4 kg in 1 day, or 5 lb/2.3 kg in a week or whatever amount you were told to report.   Blood pressure monitoring. This should be done as directed by your caregiver. A home blood pressure cuff can be purchased at a drugstore. Record your blood pressure numbers and bring them to your clinic visits. Tell your caregiver if you become  dizzy or lightheaded upon standing up.   Smoking. If you are currently a smoker, it is time to quit. Nicotine makes your heart work harder by causing your blood vessels to constrict. Do not use nicotine gum or patches before talking to your caregiver.   Follow up. Be sure to schedule a follow-up visit with your caregiver. Keep all your appointments.  SEEK MEDICAL CARE IF:   Your weight increases by 3 lb/1.4 kg in 1 day or 5 lb/2.3 kg in a week.   You notice increasing shortness of breath that is unusual for you. This may happen during rest, sleep, or with activity.   You cough more than normal, especially with physical activity.   You notice more swelling in your hands, feet, ankles, or belly (abdomen).   You are unable to sleep because it is hard to breathe.   You cough up bloody mucus (sputum).   You begin to feel "jumping" or "fluttering" sensations (palpitations) in your chest.  SEEK IMMEDIATE MEDICAL CARE IF:   You have severe chest pain or pressure which may include symptoms such as:   Pain or pressure in the arms, neck, jaw, or back.   Feeling sweaty.   Feeling sick to your stomach (nauseous).   Feeling short of breath while at rest.   Having a fast or irregular heartbeat.   You experience stroke symptoms. These symptoms include:   Facial weakness or numbness.   Weakness or numbness in an arm, leg, or on one side of your body.   Blurred vision.   Difficulty talking or thinking.   Dizziness or fainting.   Severe headache.  THESE ARE MEDICAL EMERGENCIES. Do not wait to see if the symptoms go away. Call your local emergency services (911 in [place] DO NOT drive yourself to the hospital. IMPORTANT  Make a list of every medicine, vitamin, or herbal supplement you are taking. Keep the list with you at all times. Show it to your caregiver at every visit. Keep the list up-to-date.   Ask your caregiver or pharmacist to write an explanation of each medicine you are  taking. This should include:   Why you are taking it.   The possible side effects.   The best time of day to take it.   Foods to take with it or what foods to avoid.   When to stop taking it.  MAKE SURE YOU:   Understand these instructions.   Will watch your condition.   Will get help right away if you are not doing well or get worse.  Document Released: 09/11/2005 Document Revised: 08/31/2011 Document Reviewed: 12/24/2009 [facility] Patient Information 2012 [place], [place].

## 2012-02-13 NOTE — Progress Notes (Signed)
PT was provided with d/c instructions and education. Pt verbalized understanding. Pt has no questions at this time. Pt is aware of new medications nad when/how to take them. Pt plans to f/u on Friday for INR check and then with Dr. Teressa Lower. VSS. IV removed with tip intact. Heart monitor returned to front. Ramond Craver, RN

## 2012-02-13 NOTE — Progress Notes (Signed)
Patient Name: Joseph Hamilton Date of Encounter: 02/13/2012     Principal Problem:  *NSTEMI (non-ST elevated myocardial infarction) Active Problems:  DIABETES MELLITUS, TYPE II  HYPERLIPIDEMIA-MIXED  CAD, AUTOLOGOUS BYPASS GRAFT  EMBOLISM/INFARCTION, PULMONARY NEC  GERD  RENAL INSUFFICIENCY  FATIGUE  Ischemic cardiomyopathy    SUBJECTIVE: Patient reports fatigue today. Not sleeping well in the hospital. Reports occasional chest pain. Denies shortness of breath, lightheadedness, diaphoresis or palpitations. Ambulating around the halls without incident. Would like to go home.   OBJECTIVE  Filed Vitals:   02/12/12 1202 02/12/12 1500 02/12/12 2100 02/13/12 0500  BP:  119/68 116/66 112/69  Pulse: 73 91 71 61  Temp:  98.2 F (36.8 C) 98.6 F (37 C) 98 F (36.7 C)  TempSrc:   Oral Oral  Resp:  18 18 18   Height:      Weight:      SpO2:  96% 96% 96%    Intake/Output Summary (Last 24 hours) at 02/13/12 4098 Last data filed at 02/12/12 1700  Gross per 24 hour  Intake    360 ml  Output    600 ml  Net   -240 ml   Weight change:   PHYSICAL EXAM  General: Elderly, well developed, well nourished, in no acute distress. Head: Normocephalic, atraumatic, sclera non-icteric, no xanthomas, nares are without discharge.  Neck: Supple without bruits or JVD. Lungs:  Resp regular and unlabored, CTAB without wheezes, rales or rhonchi Heart: RRR no s3, s4, or murmurs. Abdomen: Soft, non-tender, non-distended, BS + x 4.  Msk:  Strength and tone appears normal for age. Keloidal scarring of anterior median sternotomy incision noted.  Extremities: Trace pretibial edema bilaterally. No clubbing or cyanosis. DP/PT/Radials 2+ and equal bilaterally. Neuro: Alert and oriented X 3. Moves all extremities spontaneously. Psych: Normal affect. Appears anxious.  LABS:  Recent Labs  [facility] 02/12/12 1516 02/12/12 0557   WBC 5.4 7.4   HGB 12.3* 13.2   HCT 36.3* 39.1   MCV 92.1 93.3   PLT 127*  135*    Lab 02/12/12 1516 02/12/12 0557 02/11/12 1030  NA -- 135 138  K -- 4.1 5.0  CL -- 100 104  CO2 -- 23 23  BUN -- 45* 49*  CREATININE 1.76* 1.91* 1.90*  CALCIUM -- 9.3 9.9  PROT -- -- 7.5  BILITOT -- -- 0.4  ALKPHOS -- -- 46  ALT -- -- 22  AST -- -- 16  AMYLASE -- -- --  LIPASE -- -- --  GLUCOSE -- 120* 130*   Recent Labs  Basename 02/12/12 0237 02/11/12 2056 02/11/12 1504   CKTOTAL 127 134 125   CKMB 4.8* 5.5* 5.8*   CKMBINDEX -- -- --   TROPONINI 0.72* 1.80* 1.56*   Recent Labs  [facility] 02/12/12 0557   CHOL 146   HDL 38*   LDLCALC 61   TRIG 237*   CHOLHDL 3.8   LDLDIRECT --    Basename 02/11/12 1530  TSH 1.312  T4TOTAL --  T3FREE --  THYROIDAB --    TELE: NSR, 60-80 bpm; trace PVCs, downsloping ST depression noted  Radiology/Studies:  Dg Chest 2 View  02/11/2012  *RADIOLOGY REPORT*  Clinical Data: Chest pain.  Shortness of breath.  CHEST - 2 VIEW  Comparison: No priors.  Findings: Lung volumes are normal. There is an ill defined opacity projecting over the right lower lobe, with some associated bronchial wall thickening.  No pleural effusions.  There is mild pulmonary venous congestion without  frank pulmonary edema. Heart size is mildly enlarged with prominence of the left ventricular contour which suggests underlying left ventricular hypertrophy. The patient is rotated to the left on today's exam, resulting in distortion of the mediastinal contours and reduced diagnostic sensitivity and specificity for mediastinal pathology. Atherosclerotic calcifications in the arch of the aorta. Status post median sternotomy for CABG with a LIMA.  A left-sided pacemaker/AICD is in place with lead tips projecting over the expected location of the right atrial appendage and right ventricular apex.  IMPRESSION: 1.  Ill-defined opacity in the right lower lobe with associated bronchial wall thickening.  This could represent an early bronchopneumonia or sequelae of mild  aspiration.  Clinical correlation is recommended. 2.  Pulmonary venous congestion without frank pulmonary edema. 3.  Mild cardiomegaly with evidence to suggest left ventricular hypertrophy. 4.  Atherosclerosis. 5.  Postoperative changes and support apparatus, as above.  Original Report Authenticated By: Florencia Reasons, M.D.   2D ECHOCARDIOGRAM WITHOUT CONTRAST- 01/2012  Study Conclusions  - Left ventricle: Very poor acoustic windows limit study. Overall LVEF appears at leastmoderately depressed with hypokinesis/akinesis in the anterior, inferior walls. Suggest repeat evaluation when patient can turn. Doppler parameters are consistent with abnormal left ventricular relaxation (grade 1 diastolic dysfunction).  - Aortic valve: AV is thickened, calcified (moderate) with very mildly restricted motion. Mild regurgitation. Transthoracic echocardiography. M-mode, complete 2D, spectral Doppler, and color Doppler. Height: Height: 177.8cm. Height: 70in. Weight: Weight: 99.3kg. Weight: 218.5lb. Body mass index: BMI: 31.4kg/m^2. Body surface area: BSA: 2.44m^2. Patient status: Inpatient. Location: Bedside. ------------------------------------------------------------ ------------------------------------------------------------ Left ventricle: Very poor acoustic windows limit study. Overall LVEF appears at leastmoderately depressed with hypokinesis/akinesis in the anterior, inferior walls. Suggest repeat evaluation when patient can turn. Doppler parameters are consistent with abnormal left ventricular relaxation (grade 1 diastolic dysfunction). ------------------------------------------------------------ Aortic valve: AV is thickened, calcified (moderate) with very mildly restricted motion. Doppler: Mild regurgitation. ------------------------------------------------------------ Mitral valve: Calcified annulus. Mildly thickened leaflets . Doppler: Peak gradient: 3mm Hg  (D). ------------------------------------------------------------ Right ventricle: The cavity size was normal. Wall thickness was normal. Pacer wire or catheter noted in right ventricle. Systolic function was normal. ------------------------------------------------------------ Tricuspid valve: Structurally normal valve. Leaflet separation was normal. Doppler: Transvalvular velocity was within the normal range. No significant regurgitation.  ------------------------------------------------------------ Right atrium: The atrium was normal in size. ------------------------------------------------------------ Pericardium: There was no pericardial effusion. ------------------------------------------------------------ 2D measurements Normal Doppler Normal Left ventricle measurements LVID ED, 53.9 mm 43-52 Left ventricle chord, Ea, lat 7.02 cm/ ------- PLAX ann, tiss s LVID ES, 46.4 mm 23-38 DP chord, E/Ea, lat 12.24 ------- PLAX ann, tiss FS, chord, 14 % >29 DP PLAX Ea, med 5.15 cm/ ------- LVPW, ED 9.43 mm ------ ann, tiss s IVS/LVPW 1.42 <1.3 DP ratio, ED E/Ea, med 16.68 ------- Vol ED, 215 ml ------ ann, tiss MOD1 DP Vol ES, 138 ml ------ Mitral valve MOD1 Peak E vel 85.9 cm/ ------- EF, MOD1 36 % ------ s Vol index, 99 ml/m^2 ------ Peak A vel 131 cm/ ------- ED, MOD1 s Vol index, 64 ml/m^2 ------ Deceleratio 219 ms 150-230 ES, MOD1 n time Ventricular septum Peak 3 mm ------- IVS, ED 13.4 mm ------ gradient, D Hg Aorta Peak E/A 0.7 ------- Root diam, 27 mm ------ ratio ED Tricuspid valve Left atrium Regurg peak 219 cm/ ------- AP dim 33 mm ------ vel s AP dim 1.52 cm/m^2 <2.2 Peak RV-RA 19 mm ------- index gradient, S Hg Vol, S 88 ml ------ Vol index, 40.6 ml/m^2 ------ S Right ventricle RVID  ED, 37.1 mm 19-38 PLAX  Current Medications:     . amitriptyline  25 mg Oral QHS  . aspirin EC  81 mg Oral Daily  . atorvastatin  80 mg Oral QHS  . carvedilol  25 mg Oral BID  WC  . fentaNYL      . heparin      . heparin  5,000 Units Subcutaneous Q8H  . isosorbide mononitrate  60 mg Oral Daily  . lidocaine      . midazolam      . nateglinide  60 mg Oral BID WC  . nitroGLYCERIN      . pantoprazole  40 mg Oral Daily  . warfarin  4 mg Oral ONCE-1800  . Warfarin - Pharmacist Dosing Inpatient   Does not apply q1800  . DISCONTD: carvedilol  12.5 mg Oral BID WC  . DISCONTD: sodium chloride  3 mL Intravenous Q12H    ASSESSMENT:  1. NSTEMI  - s/p cardiac cath yesterday revealing severe 3 vessel CAD, patent SVG-distal RCA; patent LIMA-mid LAD; SVG-OM and ramus occluded, not amenable to PCI; normal right heart pressures  Procedural Findings:  Hemodynamics  RA 9/10 with a mean of 8 mmHg  RV 33/10 mmHg  PA 35/19 with a mean of 26 mmHg  PCWP 16/15 with a mean of 14 mmHg  LV 131/14 mmHg  AO 123/69 with a mean of 93 mmHg  Oxygen saturations:  PA 65%  AO 93%  Cardiac Output (Fick) 5.77 L per minute  Cardiac Index (Fick) 2.65 L per minute per meter square    - recommendation made for medical management 2. CAD  - s/p CABG 1994, redo 2003 3. Ischemic cardiomyopathy  - Echo yesterday technically limited; EF not calculated, moderately depressed, hypokinesis/akinesis of anterior/inferior walls; grade 1 diastolic dysfunction  - Guidant ICD in place, implanted in [place]; last interrogated on 02/13 with normal functionality 4. Chronic renal insufficiency  - baseline Cr 1.9 5. History of PE  - on chronic Coumadin; INR subtherapeutic today at 1.43 6. Type 2 DM 7. GERD 8. Hyperlipidemia  DISCUSSION/PLAN:   The patient is s/p cath yesterday as noted above. He has complex irregularities and CAD in SVG grafts as noted above. Not amenable to PCI. He will be medically managed and was started on antianginal therapy in Imdur 60mg  daily. BMET ordered this morning to evaluate renal function post-cath. No LV-gram performed to reduce contrast exposure. If at baseline, will likely  be discharged later today. ACEi held on admission. Will continue to hold until BMET results. Pretibial edema noted on exam. Will also need to continue home diuretic regimen if renal function remains stable. Coreg up-titrated. BP well-controlled. Continue Coumadin per pharmacy dosing. INR subtherapeutic today. Will need close follow-up with Coumadin clinic if discharged today.    Signed, R. Hurman Horn, PA-C 02/13/2012, 9:38 AM Patient seen and examined and history reviewed. Agree with above findings and plan. Patient feels well. Feels a little pulling in right chest that is not associated with activity. Up ambulating in halls without pain. Lungs clear. No gallop. Groin site looks good. Creatinine improved today. Will discharge home today. Resume ACEi and Lasix. Hold aldactone until renal function reassessed as outpatient. We have increased carvedilol and added long acting nitrate. Recommend cardiac Rehab phase 2. Needs follow with with Dr. Gala Romney.  Theron Arista [facility] 02/13/2012 10:50 AM

## 2012-02-14 NOTE — Progress Notes (Signed)
ICD remote 

## 2012-02-16 ENCOUNTER — Ambulatory Visit (INDEPENDENT_AMBULATORY_CARE_PROVIDER_SITE_OTHER): Payer: Medicare Other

## 2012-02-16 DIAGNOSIS — Z7901 Long term (current) use of anticoagulants: Secondary | ICD-10-CM

## 2012-02-16 DIAGNOSIS — I2699 Other pulmonary embolism without acute cor pulmonale: Secondary | ICD-10-CM

## 2012-02-22 ENCOUNTER — Ambulatory Visit (HOSPITAL_COMMUNITY)
Admission: RE | Admit: 2012-02-22 | Discharge: 2012-02-22 | Disposition: A | Discharge status: Home / Self Care | Payer: Medicare Other | Source: Ambulatory Visit | Attending: Internal Medicine | Admitting: Internal Medicine

## 2012-02-22 VITALS — BP 98/70 | HR 60 | Ht 69.75 in | Wt 220.1 lb

## 2012-02-22 DIAGNOSIS — G4733 Obstructive sleep apnea (adult) (pediatric): Secondary | ICD-10-CM | POA: Insufficient documentation

## 2012-02-22 DIAGNOSIS — I252 Old myocardial infarction: Secondary | ICD-10-CM | POA: Insufficient documentation

## 2012-02-22 DIAGNOSIS — R42 Dizziness and giddiness: Secondary | ICD-10-CM | POA: Insufficient documentation

## 2012-02-22 DIAGNOSIS — I509 Heart failure, unspecified: Secondary | ICD-10-CM | POA: Insufficient documentation

## 2012-02-22 DIAGNOSIS — R51 Headache: Secondary | ICD-10-CM | POA: Insufficient documentation

## 2012-02-22 DIAGNOSIS — I5022 Chronic systolic (congestive) heart failure: Secondary | ICD-10-CM

## 2012-02-22 DIAGNOSIS — Z79899 Other long term (current) drug therapy: Secondary | ICD-10-CM | POA: Insufficient documentation

## 2012-02-22 DIAGNOSIS — Z7982 Long term (current) use of aspirin: Secondary | ICD-10-CM | POA: Insufficient documentation

## 2012-02-22 DIAGNOSIS — Z951 Presence of aortocoronary bypass graft: Secondary | ICD-10-CM | POA: Insufficient documentation

## 2012-02-22 DIAGNOSIS — Z7901 Long term (current) use of anticoagulants: Secondary | ICD-10-CM | POA: Insufficient documentation

## 2012-02-22 DIAGNOSIS — Z86718 Personal history of other venous thrombosis and embolism: Secondary | ICD-10-CM | POA: Insufficient documentation

## 2012-02-22 DIAGNOSIS — E785 Hyperlipidemia, unspecified: Secondary | ICD-10-CM | POA: Insufficient documentation

## 2012-02-22 DIAGNOSIS — I428 Other cardiomyopathies: Secondary | ICD-10-CM | POA: Insufficient documentation

## 2012-02-22 DIAGNOSIS — I25810 Atherosclerosis of coronary artery bypass graft(s) without angina pectoris: Secondary | ICD-10-CM

## 2012-02-22 DIAGNOSIS — I2510 Atherosclerotic heart disease of native coronary artery without angina pectoris: Secondary | ICD-10-CM | POA: Insufficient documentation

## 2012-02-22 DIAGNOSIS — I10 Essential (primary) hypertension: Secondary | ICD-10-CM | POA: Insufficient documentation

## 2012-02-22 DIAGNOSIS — Z950 Presence of cardiac pacemaker: Secondary | ICD-10-CM | POA: Insufficient documentation

## 2012-02-22 MED ORDER — SPIRONOLACTONE 25 MG PO TABS: 25.0000 mg | ORAL_TABLET | Freq: Every day | ORAL | Status: DC

## 2012-02-22 MED ORDER — CARVEDILOL 25 MG PO TABS: 25.0000 mg | ORAL_TABLET | Freq: Two times a day (BID) | ORAL | Status: DC

## 2012-02-22 NOTE — Patient Instructions (Addendum)
We will refer you to cardiac rehab, we will fax them the form and they will call you to schedule  Start Spironolactone 25 mg daily  Labs in 1 week at [place]  Your physician recommends that you schedule a follow-up appointment in: 2 months.

## 2012-02-24 NOTE — Progress Notes (Signed)
Patient ID: Joseph Hamilton, male   DOB: 1941/06/25, 71 y.o.   MRN: [id number] Hematologist: Dr Myna Hidalgo PCP: Dr Ty Hilts  HPI: Joseph Hamilton is a very pleasant 71 year old male with a history of coronary artery disease, status post previous myocardial infarction, as well as redo coronary bypass grafting in 2003.  He also has congestive heart failure related to an ischemic cardiomyopathy with an EF of 28% by Myoview 12/09 and 50% by echocardiogram.  Echo in 1/11 with EF read as "mild to moderately depressed" He is status post ICD.  The remainder of his medical history is notable for a history of pulmonary emboli in 2005, CRI baseline 1.7-1.9 hypertension, hyperlipidemia, OSA, and chronic dizziness/headaches, which have been evaluated extensively.  Admitted to [facility]/11/12 with NSTEMI (non-ST elevated myocardial infarction) manifested as scapular pain. Peak troponin 1.8 02/12/12 ECHO EF 40-45% RA 9/10 with a mean of 8 mmHg  RV 33/10 mmHg  PA 35/19 with a mean of 26 mmHg  PCWP 16/15 with a mean of 14 mmHg  LV 131/14 mmHg  AO 123/69 with a mean of 93 mmHg  Oxygen saturations:  PA 65%  AO 93%  Cardiac Output (Fick) 5.77 Cardiac Index (Fick) 2.65  1. Severe 3 vessel obstructive coronary disease.  2. Patent saphenous vein graft to the distal RCA  3. Patent LIMA graft to the mid LAD supplying the second diagonal branch.  4. Other saphenous vein grafts are occluded. These are presumably the saphenous vein graft to the obtuse marginal vessel and what was called a ramus branch.  5. Normal right heart pressures.  Felt to be no clear culprit or easily revascularizable lesions. If symptoms progressed Rotoblator of LCX felt to be high risk option.   He returns for follow up post hospitalization. He was started on IMDUR 60 mg daily. Spironolactone 25 mg stopped at discharge.  He describes a pulling sensation across his chest which is chronic for him. He did take one sublingual nitroglycerin which did not improve the  pain. He associates the pain with his keloid. No scapular pain which is his angina. Denies SOB/PND/Orthopnea/dizziness. Weight at home 218-219 pounds.  He doubles his Lasix once or twice a week. Complaint with medications.    ROS: All systems negative except as listed in HPI, PMH and Problem List.  Past Medical History  Diagnosis Date  . Coronary artery disease     s/p MI X 2, REDO CABG 2003, MYOVIEW 12/09 EF 28% , EXTENSIVE ANTERIOR, INFERIOR AND LATERAL INFARCT. VERY MILD ANTERIOR ISCHEMIA  . CHF (congestive heart failure)     SECONDARY TO ISCHEMIC CARDIOMYOPATHY ; EF 28% BY MYOVIEW ; 50% BY ECHO 07/2008; ECHO 09/2009 EF "MILD TO MODERATELY REDUCED"  . Hypertension   . Hyperlipidemia   . Dizziness     CHRONIC; s/p EXTENSIVE EVAL, s/p EVAL @ [facility]; HEADACHE:  s/p EXTENSIVE EVAL; ON ELAVIL  . Sleep apnea 03/31/10    UNABLE TO TOLERATE CPAP; > HOME SLEEP TEST AHI 15.6  . Obesity   . Dupuytren contracture   . Nephrolithiasis   . CRI (chronic renal insufficiency)     BASELINE Cr. 1.7 ----DR. MATTINGLY  . Carotid artery occlusion     0-39% BILATERALLY W/CHRONICALLY OCCLUDED L VERTEBRAL  . Myocardial infarction   . Angina   . ICD (implantable cardiac defibrillator) in place   . Pacemaker   . PE (pulmonary embolism) 2005    Current Outpatient Prescriptions  Medication Sig Dispense Refill  . acetaminophen (TYLENOL) 500  MG tablet Take 500 mg by mouth every 6 (six) hours as needed. For pain      . amitriptyline (ELAVIL) 25 MG tablet Take 25 mg by mouth at bedtime.      Marland Kitchen aspirin 81 MG EC tablet Take 81 mg by mouth daily.        . carvedilol (COREG) 25 MG tablet Take 1 tablet (25 mg total) by mouth 2 (two) times daily with a meal.  180 tablet  3  . furosemide (LASIX) 40 MG tablet Take 40 mg by mouth daily.      . isosorbide mononitrate (IMDUR) 60 MG 24 hr tablet Take 1 tablet (60 mg total) by mouth daily.  30 tablet  3  . nateglinide (STARLIX) 60 MG tablet Take 60 mg by mouth 2 (two)  times daily with a meal. Take with the two largest meals of the day      . nitroGLYCERIN (NITROSTAT) 0.4 MG SL tablet Place 1 tablet (0.4 mg total) under the tongue every 5 (five) minutes x 3 doses as needed for chest pain.  25 tablet  3  . omeprazole (PRILOSEC) 20 MG capsule Take 20 mg by mouth daily.      . ramipril (ALTACE) 10 MG capsule Take 1 capsule (10 mg total) by mouth daily.  30 capsule  5  . rosuvastatin (CRESTOR) 40 MG tablet Take 40 mg by mouth daily.      Marland Kitchen warfarin ([name]) 3 MG tablet Take 3 mg by mouth daily.      Marland Kitchen zolpidem (AMBIEN CR) 12.5 MG CR tablet Take 12.5 mg by mouth at bedtime.      Marland Kitchen spironolactone (ALDACTONE) 25 MG tablet Take 1 tablet (25 mg total) by mouth daily.  30 tablet  6     PHYSICAL EXAM: Filed Vitals:   02/22/12 1123  BP: 98/70  Pulse: 60   Weight change:  General:  Well appearing. No resp difficulty HEENT: normal Neck: supple. JVP flat. Carotids 2+ bilaterally; no bruits. No lymphadenopathy or thryomegaly appreciated. Cor: PMI normal. Regular rate & rhythm. No rubs, gallops or murmurs. Lungs: clear Abdomen: soft, nontender, nondistended. No hepatosplenomegaly. No bruits or masses. Good bowel sounds. Extremities: no cyanosis, clubbing, rash, edema Neuro: alert & orientedx3, cranial nerves grossly intact. Moves all 4 extremities w/o difficulty. Affect pleasant. Skin: Chest keloid present central aspect. L upper chest ICD    ECG: NSR 61    ASSESSMENT & PLAN:

## 2012-02-24 NOTE — Assessment & Plan Note (Signed)
I reviewed the results of his cath with him. He has severe CAD. There are no good targets for revascularization. Rotoblator of LCX only possible high risk option. Currently without angina. Continue aggressive medical RX. Refer to cariac rehab post-MI.

## 2012-02-24 NOTE — Assessment & Plan Note (Signed)
Echo images reviewed personally. EF 40-45%. Volume status looks fine. Reinforced need for daily weights and reviewed use of sliding scale diuretics.

## 2012-02-26 ENCOUNTER — Encounter: Payer: Self-pay | Admitting: *Deleted

## 2012-02-29 ENCOUNTER — Ambulatory Visit (INDEPENDENT_AMBULATORY_CARE_PROVIDER_SITE_OTHER): Payer: Medicare Other | Admitting: *Deleted

## 2012-02-29 ENCOUNTER — Other Ambulatory Visit (INDEPENDENT_AMBULATORY_CARE_PROVIDER_SITE_OTHER): Payer: Medicare Other

## 2012-02-29 DIAGNOSIS — Z7901 Long term (current) use of anticoagulants: Secondary | ICD-10-CM

## 2012-02-29 DIAGNOSIS — I2699 Other pulmonary embolism without acute cor pulmonale: Secondary | ICD-10-CM

## 2012-02-29 DIAGNOSIS — I5022 Chronic systolic (congestive) heart failure: Secondary | ICD-10-CM

## 2012-02-29 LAB — BASIC METABOLIC PANEL
BUN: 46 mg/dL — ABNORMAL HIGH (ref 6–23)
Calcium: 9.3 mg/dL (ref 8.4–10.5)
GFR: 33.54 mL/min — ABNORMAL LOW (ref >60.00)
Glucose, Bld: 80 mg/dL (ref 70–99)
Sodium: 139 mEq/L (ref 135–145)

## 2012-03-08 ENCOUNTER — Telehealth: Payer: Self-pay | Admitting: Internal Medicine

## 2012-03-08 ENCOUNTER — Telehealth (HOSPITAL_COMMUNITY): Payer: Self-pay | Admitting: *Deleted

## 2012-03-08 NOTE — Telephone Encounter (Signed)
Per office visit 5/30 the patient is to take 25 mg of spiro daily.  He voiced understanding and appreciated the call back.

## 2012-03-08 NOTE — Telephone Encounter (Signed)
Joseph Hamilton called today.  He needs clarification on his spironolactone. Please call him back.  Thanks.

## 2012-03-11 ENCOUNTER — Telehealth: Payer: Self-pay | Admitting: Internal Medicine

## 2012-03-11 NOTE — Telephone Encounter (Signed)
Please return call to Aggie Cosier [facility]  Cardiac Rehab  [phone]  Patient starts cardiac rehab tomorrow and they order needs to be returned by End of Day or patient will not be allowed to start rehab.   Aggie Cosier will be in a meeting from 1-1:15pm, this is the 3rd request for order.  Please leave a message on her vm with the status of cardia rehab order. Documentation can be faxed to [phone]

## 2012-03-11 NOTE — Telephone Encounter (Signed)
Have not received any request, order and pt's records faxed to them at [phone]

## 2012-03-11 NOTE — Telephone Encounter (Signed)
Error

## 2012-03-12 ENCOUNTER — Ambulatory Visit (INDEPENDENT_AMBULATORY_CARE_PROVIDER_SITE_OTHER): Payer: Medicare Other | Admitting: Pharmacist

## 2012-03-12 ENCOUNTER — Other Ambulatory Visit (INDEPENDENT_AMBULATORY_CARE_PROVIDER_SITE_OTHER): Payer: Medicare Other

## 2012-03-12 ENCOUNTER — Telehealth (HOSPITAL_COMMUNITY): Payer: Self-pay | Admitting: Vascular Surgery

## 2012-03-12 DIAGNOSIS — Z7901 Long term (current) use of anticoagulants: Secondary | ICD-10-CM

## 2012-03-12 DIAGNOSIS — I2699 Other pulmonary embolism without acute cor pulmonale: Secondary | ICD-10-CM

## 2012-03-12 DIAGNOSIS — E119 Type 2 diabetes mellitus without complications: Secondary | ICD-10-CM

## 2012-03-12 NOTE — Telephone Encounter (Signed)
Pt request his recent notes be faxed to Dr Briant Cedar, he is seeing him soon for his yearly follow up.

## 2012-03-12 NOTE — Telephone Encounter (Signed)
Pt wanted to speak to you about sending his records to [facility]

## 2012-03-13 LAB — BASIC METABOLIC PANEL
BUN: 49 mg/dL — ABNORMAL HIGH (ref 6–23)
Creat: 2.32 mg/dL — ABNORMAL HIGH (ref 0.50–1.35)
Potassium: 4.9 mEq/L (ref 3.5–5.3)

## 2012-03-14 ENCOUNTER — Ambulatory Visit: Payer: Medicare Other | Admitting: Hematology & Oncology

## 2012-03-14 ENCOUNTER — Other Ambulatory Visit: Payer: Medicare Other | Admitting: Lab

## 2012-03-15 ENCOUNTER — Ambulatory Visit (INDEPENDENT_AMBULATORY_CARE_PROVIDER_SITE_OTHER): Payer: Medicare Other | Admitting: Internal Medicine

## 2012-03-15 ENCOUNTER — Encounter: Payer: Self-pay | Admitting: Internal Medicine

## 2012-03-15 VITALS — BP 108/64 | HR 57 | Temp 97.6°F | Resp 18 | Ht 69.5 in | Wt 223.0 lb

## 2012-03-15 DIAGNOSIS — E119 Type 2 diabetes mellitus without complications: Secondary | ICD-10-CM

## 2012-03-15 MED ORDER — OMEPRAZOLE 40 MG PO CPDR: 40.0000 mg | DELAYED_RELEASE_CAPSULE | Freq: Every day | ORAL | Status: DC

## 2012-03-15 NOTE — Progress Notes (Signed)
  Subjective:    Patient ID: Joseph Hamilton, male    DOB: 1941/07/10, 71 y.o.   MRN: [id number]  HPI Pt presents to discuss and update recent medical events. Suffered NSTEMI 5/13 and underwent cardiac cath suggesting SVG non-patency. Felt to not be amenable for PCI and was recommended for medical management. Begun on imdur and coreg titrated-tolerating both. H/o CRI and Cr since that time has been mildly above baseline low 2's compared to upper 1's. Has nephrology f/u in near future. Having fsbs checked at cardiac rehab [phone] and 144 recently) but not checking fsbs on own. Due to recent substantial testing wishes to defer routine dm followup to a later date.     Review of Systems     Objective:   Physical Exam  Nursing note and vitals reviewed. Constitutional: He appears well-developed and well-nourished. No distress.  Neurological: He is alert.  Skin: He is not diaphoretic.  Psychiatric: He has a normal mood and affect.          Assessment & Plan:

## 2012-03-15 NOTE — Assessment & Plan Note (Signed)
Agree to postpone f/u appt one month and schedule chem7, a1c with visit. Encouraged to monitor fsbs daily.

## 2012-03-15 NOTE — Patient Instructions (Addendum)
Please cancel next appointment. Schedule next appointment ~2 months Labs prior to next visit chem7, a1c-250.0

## 2012-04-02 ENCOUNTER — Ambulatory Visit (INDEPENDENT_AMBULATORY_CARE_PROVIDER_SITE_OTHER): Payer: Medicare Other | Admitting: *Deleted

## 2012-04-02 ENCOUNTER — Encounter: Payer: Medicare Other | Admitting: Internal Medicine

## 2012-04-02 DIAGNOSIS — Z7901 Long term (current) use of anticoagulants: Secondary | ICD-10-CM

## 2012-04-02 DIAGNOSIS — I2699 Other pulmonary embolism without acute cor pulmonale: Secondary | ICD-10-CM

## 2012-04-02 LAB — POCT INR: INR: 2.4

## 2012-04-03 ENCOUNTER — Encounter: Payer: Self-pay | Admitting: *Deleted

## 2012-04-03 DIAGNOSIS — Z95810 Presence of automatic (implantable) cardiac defibrillator: Secondary | ICD-10-CM | POA: Insufficient documentation

## 2012-04-08 ENCOUNTER — Ambulatory Visit (INDEPENDENT_AMBULATORY_CARE_PROVIDER_SITE_OTHER): Payer: Medicare Other | Admitting: Internal Medicine

## 2012-04-08 ENCOUNTER — Encounter: Payer: Self-pay | Admitting: Internal Medicine

## 2012-04-08 VITALS — BP 96/76 | HR 64 | Ht 69.0 in | Wt 220.0 lb

## 2012-04-08 DIAGNOSIS — I255 Ischemic cardiomyopathy: Secondary | ICD-10-CM

## 2012-04-08 DIAGNOSIS — Z95810 Presence of automatic (implantable) cardiac defibrillator: Secondary | ICD-10-CM

## 2012-04-08 DIAGNOSIS — I2589 Other forms of chronic ischemic heart disease: Secondary | ICD-10-CM

## 2012-04-08 LAB — ICD DEVICE OBSERVATION
AL IMPEDENCE ICD: 484 Ohm
ATRIAL PACING ICD: 0 pct
HV IMPEDENCE: 51 Ohm
RV LEAD IMPEDENCE ICD: 781 Ohm
VENTRICULAR PACING ICD: 0 pct

## 2012-04-08 NOTE — Assessment & Plan Note (Signed)
The patient's device was interrogated.  The information was reviewed. No changes were made in the programming.    

## 2012-04-08 NOTE — Progress Notes (Signed)
HPI  Joseph Hamilton is a 71 y.o. male Seen in followup for ischemic heart disease with a previously implanted ICD for primary prevention.  He has a history of coronary artery disease, status post previous myocardial infarction, as well as redo coronary bypass grafting in 2003. He also has congestive heart failure related to an ischemic cardiomyopathy   He suffered another non-STEMI in May 2013: Underwent catheterization demonstrating patent grafts to the distal RCA and the LIMA to the vein grafts occluded. Medical therapy was recommended. Ejection fraction by echo was 40-45%    The patient denies chest pain, shortness of breath, nocturnal dyspnea, orthopnea or peripheral edema. He adjusts his lasix as needed for edema There have been no palpitations, lightheadedness or syncope.   He is not currently having chest pain edema or significant shortness of breath  Past Medical History  Diagnosis Date  . Coronary artery disease     s/p MI X 2, REDO CABG 2003, MYOVIEW 12/09 EF 28% , EXTENSIVE ANTERIOR, INFERIOR AND LATERAL INFARCT. VERY MILD ANTERIOR ISCHEMIA  . CHF (congestive heart failure)     SECONDARY TO ISCHEMIC CARDIOMYOPATHY ; EF 28% BY MYOVIEW ; 50% BY ECHO 07/2008; ECHO 09/2009 EF "MILD TO MODERATELY REDUCED"  . Hypertension   . Hyperlipidemia   . Dizziness     CHRONIC; s/p EXTENSIVE EVAL, s/p EVAL @ [facility]; HEADACHE:  s/p EXTENSIVE EVAL; ON ELAVIL  . Sleep apnea 03/31/10    UNABLE TO TOLERATE CPAP; > HOME SLEEP TEST AHI 15.6  . Obesity   . Dupuytren contracture   . Nephrolithiasis   . CRI (chronic renal insufficiency)     BASELINE Cr. 1.7 ----DR. MATTINGLY  . Carotid artery occlusion     0-39% BILATERALLY W/CHRONICALLY OCCLUDED L VERTEBRAL  . Myocardial infarction   . Angina   . ICD (implantable cardiac defibrillator) in place   . Pacemaker   . PE (pulmonary embolism) 2005    Past Surgical History  Procedure Date  . Insert / replace / remove pacemaker 2006    s/p GUIDANT  IMPLANTABLE CARDIVERTER DEFIBRILLATOR  . Coronary artery bypass graft 1994    1994 AND 2003  . Cataract extraction   . Tonsillectomy     AS A CHILD  . Kidney stone surgery     Current Outpatient Prescriptions  Medication Sig Dispense Refill  . acetaminophen (TYLENOL) 500 MG tablet Take 500 mg by mouth every 6 (six) hours as needed. For pain      . amitriptyline (ELAVIL) 25 MG tablet Take 25 mg by mouth at bedtime.      Marland Kitchen aspirin 81 MG EC tablet Take 81 mg by mouth daily.        . carvedilol (COREG) 25 MG tablet Take 1 tablet (25 mg total) by mouth 2 (two) times daily with a meal.  180 tablet  3  . furosemide (LASIX) 40 MG tablet Take 40 mg by mouth daily.      . isosorbide mononitrate (IMDUR) 60 MG 24 hr tablet Take 1 tablet (60 mg total) by mouth daily.  30 tablet  3  . nateglinide (STARLIX) 60 MG tablet Take 60 mg by mouth 2 (two) times daily with a meal. Take with the two largest meals of the day      . nitroGLYCERIN (NITROSTAT) 0.4 MG SL tablet Place 1 tablet (0.4 mg total) under the tongue every 5 (five) minutes x 3 doses as needed for chest pain.  25 tablet  3  .  omeprazole (PRILOSEC) 40 MG capsule Take 1 capsule (40 mg total) by mouth daily.  30 capsule  11  . ramipril (ALTACE) 10 MG capsule Take 1 capsule (10 mg total) by mouth daily.  30 capsule  5  . rosuvastatin (CRESTOR) 40 MG tablet Take 40 mg by mouth daily.      Marland Kitchen spironolactone (ALDACTONE) 25 MG tablet Take 1 tablet (25 mg total) by mouth daily.  30 tablet  6  . warfarin ([name]) 3 MG tablet Take 3 mg by mouth daily.      Marland Kitchen zolpidem (AMBIEN CR) 12.5 MG CR tablet Take 12.5 mg by mouth at bedtime.        Allergies  Allergen Reactions  . Ezetimibe-Simvastatin     REACTION: myalgia: TOLERATES PRAVOCHOL OK    Review of Systems negative except from HPI and PMH  Physical Exam BP 96/76  Pulse 64  Ht 5\' 9"  (1.753 m)  Wt 220 lb (99.791 kg)  BMI 32.49 kg/m2 Well developed and well nourished in no acute distress HENT  normal E scleral and icterus clear Neck Supple JVP flat; carotids brisk and full Clear to ausculation  regular rate and rhythm, no murmurs gallops or rub Soft with active bowel sounds No clubbing cyanosis none Edema Alert and oriented, grossly normal motor and sensory function Skin Warm and Dry    Assessment and  Plan

## 2012-04-08 NOTE — Patient Instructions (Signed)
Remote monitoring is used to monitor your Pacemaker of ICD from home. This monitoring reduces the number of office visits required to check your device to one time per year. It allows [place] to keep an eye on the functioning of your device to ensure it is working properly. You are scheduled for a device check from home on 07/18/12. You may send your transmission at any time that day. If you have a wireless device, the transmission will be sent automatically. After your physician reviews your transmission, you will receive a postcard with your next transmission date.  Your physician wants you to follow-up in: 1 year with Dr. Graciela Husbands. You will receive a reminder letter in the mail two months in advance. If you don't receive a letter, please call our office to schedule the follow-up appointment.  Your physician recommends that you continue on your current medications as directed. Please refer to the Current Medication list given to you today.

## 2012-04-08 NOTE — Assessment & Plan Note (Signed)
Stable continue current medications

## 2012-04-16 ENCOUNTER — Ambulatory Visit: Payer: Medicare Other | Admitting: Internal Medicine

## 2012-04-16 ENCOUNTER — Other Ambulatory Visit: Payer: Self-pay | Admitting: *Deleted

## 2012-04-16 MED ORDER — RAMIPRIL 10 MG PO CAPS: 10.0000 mg | ORAL_CAPSULE | Freq: Every day | ORAL | Status: DC

## 2012-04-30 ENCOUNTER — Ambulatory Visit (INDEPENDENT_AMBULATORY_CARE_PROVIDER_SITE_OTHER): Payer: Medicare Other | Admitting: Pharmacist

## 2012-04-30 DIAGNOSIS — Z7901 Long term (current) use of anticoagulants: Secondary | ICD-10-CM

## 2012-04-30 DIAGNOSIS — I2699 Other pulmonary embolism without acute cor pulmonale: Secondary | ICD-10-CM

## 2012-05-05 ENCOUNTER — Encounter: Payer: Self-pay | Admitting: Internal Medicine

## 2012-05-06 ENCOUNTER — Telehealth: Payer: Self-pay | Admitting: *Deleted

## 2012-05-06 NOTE — Telephone Encounter (Signed)
Received remote transmission.  Device at [facility] since 04-30-2012.  Patient aware of this.  He will call back to schedule appt with Dr Graciela Husbands to discuss generator change.  Patient's battery voltage is still 2.58 which is not ERI, but charge time has extended to 19.5 seconds which triggered elective replacement.

## 2012-05-06 NOTE — Telephone Encounter (Signed)
Appt scheduled to discuss generator change with Dr Graciela Husbands

## 2012-05-09 ENCOUNTER — Telehealth: Payer: Self-pay | Admitting: *Deleted

## 2012-05-09 DIAGNOSIS — E119 Type 2 diabetes mellitus without complications: Secondary | ICD-10-CM

## 2012-05-09 LAB — BASIC METABOLIC PANEL
BUN: 51 mg/dL — ABNORMAL HIGH (ref 6–23)
Chloride: 108 mEq/L (ref 96–112)
Glucose, Bld: 119 mg/dL — ABNORMAL HIGH (ref 70–99)
Potassium: 4.7 mEq/L (ref 3.5–5.3)

## 2012-05-09 NOTE — Telephone Encounter (Signed)
Pt presented to the lab. Orders placed per 03/15/12 office note.   Labs prior to next visit chem7, a1c-250.0

## 2012-05-16 ENCOUNTER — Ambulatory Visit (INDEPENDENT_AMBULATORY_CARE_PROVIDER_SITE_OTHER): Payer: Medicare Other | Admitting: Internal Medicine

## 2012-05-16 ENCOUNTER — Encounter: Payer: Self-pay | Admitting: Internal Medicine

## 2012-05-16 VITALS — BP 99/64 | HR 76 | Ht 69.5 in | Wt 220.8 lb

## 2012-05-16 DIAGNOSIS — I255 Ischemic cardiomyopathy: Secondary | ICD-10-CM

## 2012-05-16 DIAGNOSIS — I959 Hypotension, unspecified: Secondary | ICD-10-CM

## 2012-05-16 DIAGNOSIS — Z95810 Presence of automatic (implantable) cardiac defibrillator: Secondary | ICD-10-CM

## 2012-05-16 DIAGNOSIS — I2589 Other forms of chronic ischemic heart disease: Secondary | ICD-10-CM

## 2012-05-16 DIAGNOSIS — I5022 Chronic systolic (congestive) heart failure: Secondary | ICD-10-CM

## 2012-05-16 LAB — ICD DEVICE OBSERVATION
AL THRESHOLD: 0.6 V
BATTERY VOLTAGE: 2.58 V
CHARGE TIME: 19.5 s
DEV-0020ICD: NEGATIVE
RV LEAD AMPLITUDE: 25 mv
RV LEAD THRESHOLD: 0.8 V

## 2012-05-16 NOTE — Patient Instructions (Addendum)
Your physician recommends that you schedule a follow-up appointment in: early October with Dr. Graciela Husbands.  Your physician recommends that you continue on your current medications as directed. Please refer to the Current Medication list given to you today.

## 2012-05-16 NOTE — Assessment & Plan Note (Signed)
He is asymptomatic blood pressures in the high 80s low 90s and low 100s. Given the lack of symptoms, we'll leave him on his afterload regime prescribed by Dr. Dorthea Cove

## 2012-05-16 NOTE — Assessment & Plan Note (Signed)
The patient has an ICD implanted for some years ago for primary prevention. He recalls that his ejection fraction was in the high 30% range. We talked about device generator replacement, he was quite chagrin about the idea of another surgery. I raised the possibility for him that options could include doing nothing removing the device, changing out the device. He would like to consider these options and we will revisit in about 6 weeks. I also placed a call to [facility] [employer] to assess whether there are any end-of-life characteristics that might preclude grading of the device behind.

## 2012-05-16 NOTE — Assessment & Plan Note (Signed)
His ejection fraction is 40-45%. He has a QRS duration of 105 ms. There is nothing from the device point that we can do to augment his functional status

## 2012-05-16 NOTE — Progress Notes (Signed)
HPI  Joseph Hamilton is a 71 y.o. male Seen in followup for ischemic heart disease with a previously implanted ICD for primary prevention.  He has a history of coronary artery disease, status post previous myocardial infarction, as well as redo coronary bypass grafting in 2003. He also has congestive heart failure related to an ischemic cardiomyopathy  He suffered another non-STEMI in May 2013: Underwent catheterization demonstrating patent grafts to the distal RCA and the LIMA to the vein grafts occluded. Medical therapy was recommended. Ejection fraction by echo was 40-45% His device has reached ERI   'he has modest exercise intolerance coupled with significant fatigue noted after exercise  He snores, but is adamant about not using mask   Past Medical History  Diagnosis Date  . Coronary artery disease     s/p MI X 2, REDO CABG 2003, MYOVIEW 12/09 EF 28% , EXTENSIVE ANTERIOR, INFERIOR AND LATERAL INFARCT. VERY MILD ANTERIOR ISCHEMIA  . CHF (congestive heart failure)     SECONDARY TO ISCHEMIC CARDIOMYOPATHY ; EF 28% BY MYOVIEW ; 50% BY ECHO 07/2008; ECHO 09/2009 EF "MILD TO MODERATELY REDUCED"  . Hypertension   . Hyperlipidemia   . Dizziness     CHRONIC; s/p EXTENSIVE EVAL, s/p EVAL @ [facility]; HEADACHE:  s/p EXTENSIVE EVAL; ON ELAVIL  . Sleep apnea 03/31/10    UNABLE TO TOLERATE CPAP; > HOME SLEEP TEST AHI 15.6  . Obesity   . Dupuytren contracture   . Nephrolithiasis   . CRI (chronic renal insufficiency)     BASELINE Cr. 1.7 ----DR. MATTINGLY  . Carotid artery occlusion     0-39% BILATERALLY W/CHRONICALLY OCCLUDED L VERTEBRAL  . Myocardial infarction   . Angina   . ICD (implantable cardiac defibrillator) in place   . Pacemaker   . PE (pulmonary embolism) 2005    Past Surgical History  Procedure Date  . Insert / replace / remove pacemaker 2006    s/p GUIDANT IMPLANTABLE CARDIVERTER DEFIBRILLATOR  . Coronary artery bypass graft 1994    1994 AND 2003  . Cataract extraction   .  Tonsillectomy     AS A CHILD  . Kidney stone surgery     Current Outpatient Prescriptions  Medication Sig Dispense Refill  . acetaminophen (TYLENOL) 500 MG tablet Take 500 mg by mouth every 6 (six) hours as needed. For pain      . aspirin 81 MG EC tablet Take 81 mg by mouth daily.        . carvedilol (COREG) 25 MG tablet Take 1 tablet (25 mg total) by mouth 2 (two) times daily with a meal.  180 tablet  3  . furosemide (LASIX) 40 MG tablet Take 40 mg by mouth daily.      . isosorbide mononitrate (IMDUR) 60 MG 24 hr tablet Take 1 tablet (60 mg total) by mouth daily.  30 tablet  3  . nateglinide (STARLIX) 60 MG tablet Take 60 mg by mouth 2 (two) times daily with a meal. Take with the two largest meals of the day      . nitroGLYCERIN (NITROSTAT) 0.4 MG SL tablet Place 1 tablet (0.4 mg total) under the tongue every 5 (five) minutes x 3 doses as needed for chest pain.  25 tablet  3  . omeprazole (PRILOSEC) 40 MG capsule Take 1 capsule (40 mg total) by mouth daily.  30 capsule  11  . ramipril (ALTACE) 10 MG capsule Take 1 capsule (10 mg total) by mouth daily.  30  capsule  5  . rosuvastatin (CRESTOR) 40 MG tablet Take 40 mg by mouth daily.      Marland Kitchen spironolactone (ALDACTONE) 25 MG tablet Take 1 tablet (25 mg total) by mouth daily.  30 tablet  6  . warfarin ([name]) 3 MG tablet Take 3 mg by mouth daily.      Marland Kitchen zolpidem (AMBIEN CR) 12.5 MG CR tablet Take 12.5 mg by mouth at bedtime.        Allergies  Allergen Reactions  . Ezetimibe-Simvastatin     REACTION: myalgia: TOLERATES PRAVOCHOL OK    Review of Systems negative except from HPI and PMH  Physical Exam BP 99/64  Pulse 76  Ht 5' 9.5" (1.765 m)  Wt 220 lb 12.8 oz (100.154 kg)  BMI 32.14 kg/m2 Well developed and well nourished in no acute distress HENT normal E scleral and icterus clear Neck Supple JVP flat; carotids brisk and full Clear to ausculation Device pocket well healed; without hematoma or erythema regular rate and rhythm,  no murmurs gallops or rub Soft with active bowel sounds No clubbing cyanosis none Edema Alert and oriented, grossly normal motor and sensory function Skin Warm and Dry  ECG: Sinus Rhythm         Intervals  21/11/36        Assessment and  Plan

## 2012-05-16 NOTE — Assessment & Plan Note (Signed)
As above.

## 2012-05-17 ENCOUNTER — Telehealth: Payer: Self-pay | Admitting: Internal Medicine

## 2012-05-17 ENCOUNTER — Ambulatory Visit (INDEPENDENT_AMBULATORY_CARE_PROVIDER_SITE_OTHER): Payer: Medicare Other | Admitting: Internal Medicine

## 2012-05-17 ENCOUNTER — Encounter: Payer: Self-pay | Admitting: Internal Medicine

## 2012-05-17 VITALS — BP 88/52 | HR 63 | Temp 97.6°F | Resp 18 | Wt 220.0 lb

## 2012-05-17 DIAGNOSIS — E785 Hyperlipidemia, unspecified: Secondary | ICD-10-CM

## 2012-05-17 DIAGNOSIS — E119 Type 2 diabetes mellitus without complications: Secondary | ICD-10-CM

## 2012-05-17 DIAGNOSIS — I959 Hypotension, unspecified: Secondary | ICD-10-CM

## 2012-05-17 DIAGNOSIS — R5383 Other fatigue: Secondary | ICD-10-CM

## 2012-05-17 DIAGNOSIS — R42 Dizziness and giddiness: Secondary | ICD-10-CM

## 2012-05-17 DIAGNOSIS — N259 Disorder resulting from impaired renal tubular function, unspecified: Secondary | ICD-10-CM

## 2012-05-17 NOTE — Telephone Encounter (Signed)
Please schedule fasting labs prior to next visit cbc, chem7, a1c-250.00 and lipid/lft-272.4   Patient has upcoming appointment for end of December. He will be going to [facility] lab.

## 2012-05-17 NOTE — Telephone Encounter (Signed)
Labs entered... 05/17/12@4 :18pm/LMB

## 2012-05-17 NOTE — Patient Instructions (Signed)
Please schedule fasting labs prior to next visit cbc, chem7, a1c-250.00 and lipid/lft-272.4

## 2012-05-19 NOTE — Assessment & Plan Note (Signed)
Asx. Continue current regimen to obtain cardiac benefits from medications.

## 2012-05-19 NOTE — Progress Notes (Signed)
  Subjective:    Patient ID: Joseph Hamilton, male    DOB: [birth year], 71 y.o.   MRN: [id number]  HPI Pt presents to clinic for followup of multiple medical problems. BP low to low nl with cardiac medication but denies any change in chronic dizziness and denies presyncope/syncope. Renal fxn stable without evidence of volume overload. A1c reviewed improved.   Past Medical History  Diagnosis Date  . Coronary artery disease     s/p MI X 2, REDO CABG 2003, MYOVIEW 12/09 EF 28% , EXTENSIVE ANTERIOR, INFERIOR AND LATERAL INFARCT. VERY MILD ANTERIOR ISCHEMIA  . CHF (congestive heart failure)     SECONDARY TO ISCHEMIC CARDIOMYOPATHY ; EF 28% BY MYOVIEW ; 50% BY ECHO 07/2008; ECHO 09/2009 EF "MILD TO MODERATELY REDUCED"  . Hypertension   . Hyperlipidemia   . Dizziness     CHRONIC; s/p EXTENSIVE EVAL, s/p EVAL @ [facility]; HEADACHE:  s/p EXTENSIVE EVAL; ON ELAVIL  . Sleep apnea 03/31/10    UNABLE TO TOLERATE CPAP; > HOME SLEEP TEST AHI 15.6  . Obesity   . Dupuytren contracture   . Nephrolithiasis   . CRI (chronic renal insufficiency)     BASELINE Cr. 1.7 ----DR. MATTINGLY  . Carotid artery occlusion     0-39% BILATERALLY W/CHRONICALLY OCCLUDED L VERTEBRAL  . Myocardial infarction   . Angina   . ICD (implantable cardiac defibrillator) in place   . Pacemaker   . PE (pulmonary embolism) 2005   Past Surgical History  Procedure Date  . Insert / replace / remove pacemaker 2006    s/p GUIDANT IMPLANTABLE CARDIVERTER DEFIBRILLATOR  . Coronary artery bypass graft 1994    1994 AND 2003  . Cataract extraction   . Tonsillectomy     AS A CHILD  . Kidney stone surgery     reports that he quit smoking about 18 years ago. His smoking use included Pipe. He has never used smokeless tobacco. He reports that he drinks alcohol. He reports that he does not use illicit drugs. family history is not on file. Allergies  Allergen Reactions  . Ezetimibe-Simvastatin     REACTION: myalgia: TOLERATES PRAVOCHOL OK       Review of Systems see hpi     Objective:   Physical Exam  Physical Exam  Nursing note and vitals reviewed. Constitutional: Appears well-developed and well-nourished. No distress.  HENT:  Head: Normocephalic and atraumatic.  Right Ear: External ear normal.  Left Ear: External ear normal.  Eyes: Conjunctivae are normal. No scleral icterus.  Neck: Neck supple. Carotid bruit is not present.  Cardiovascular: Normal rate, regular rhythm and normal heart sounds.  Exam reveals no gallop and no friction rub.   No murmur heard. Pulmonary/Chest: Effort normal and breath sounds normal. No respiratory distress. He has no wheezes. no rales.  Lymphadenopathy:    He has no cervical adenopathy.  Neurological:Alert.  Skin: Skin is warm and dry. Not diaphoretic.  Psychiatric: Has a normal mood and affect.        Assessment & Plan:

## 2012-05-19 NOTE — Assessment & Plan Note (Signed)
Good control. Continue current regimen. 

## 2012-05-19 NOTE — Assessment & Plan Note (Signed)
Stable. No evidence of volume overload. Avoid nsaids.

## 2012-05-21 ENCOUNTER — Ambulatory Visit (INDEPENDENT_AMBULATORY_CARE_PROVIDER_SITE_OTHER): Payer: Medicare Other | Admitting: *Deleted

## 2012-05-21 DIAGNOSIS — I2699 Other pulmonary embolism without acute cor pulmonale: Secondary | ICD-10-CM

## 2012-05-21 DIAGNOSIS — Z7901 Long term (current) use of anticoagulants: Secondary | ICD-10-CM

## 2012-05-28 ENCOUNTER — Other Ambulatory Visit (HOSPITAL_COMMUNITY): Payer: Self-pay | Admitting: Cardiology

## 2012-05-28 DIAGNOSIS — I214 Non-ST elevation (NSTEMI) myocardial infarction: Secondary | ICD-10-CM

## 2012-05-28 MED ORDER — NITROGLYCERIN 0.4 MG SL SUBL: 0.4000 mg | SUBLINGUAL_TABLET | SUBLINGUAL | Status: DC | PRN

## 2012-05-28 NOTE — Telephone Encounter (Signed)
Pt called to request a refill on nitro as he is out of this med. Refill sent to pharmacy

## 2012-06-18 ENCOUNTER — Ambulatory Visit (INDEPENDENT_AMBULATORY_CARE_PROVIDER_SITE_OTHER): Payer: Medicare Other

## 2012-06-18 DIAGNOSIS — I2699 Other pulmonary embolism without acute cor pulmonale: Secondary | ICD-10-CM

## 2012-06-18 DIAGNOSIS — Z7901 Long term (current) use of anticoagulants: Secondary | ICD-10-CM

## 2012-06-25 ENCOUNTER — Ambulatory Visit (INDEPENDENT_AMBULATORY_CARE_PROVIDER_SITE_OTHER): Payer: Medicare Other | Admitting: Internal Medicine

## 2012-06-25 ENCOUNTER — Encounter: Payer: Self-pay | Admitting: Internal Medicine

## 2012-06-25 VITALS — BP 104/62 | HR 55 | Ht 69.0 in | Wt 217.8 lb

## 2012-06-25 DIAGNOSIS — I959 Hypotension, unspecified: Secondary | ICD-10-CM

## 2012-06-25 DIAGNOSIS — Z95810 Presence of automatic (implantable) cardiac defibrillator: Secondary | ICD-10-CM

## 2012-06-25 DIAGNOSIS — I255 Ischemic cardiomyopathy: Secondary | ICD-10-CM

## 2012-06-25 DIAGNOSIS — I2589 Other forms of chronic ischemic heart disease: Secondary | ICD-10-CM

## 2012-06-25 LAB — ICD DEVICE OBSERVATION
AL AMPLITUDE: 2.3 mv
AL THRESHOLD: 0.6 V
ATRIAL PACING ICD: 2 pct
DEV-0020ICD: NEGATIVE
RV LEAD IMPEDENCE ICD: 693 Ohm
RV LEAD THRESHOLD: 0.8 V

## 2012-06-25 NOTE — Assessment & Plan Note (Signed)
As above.

## 2012-06-25 NOTE — Progress Notes (Signed)
Patient Care Team: Edwyna Perfect, MD as PCP - General (Internal Medicine)   HPI  Joseph Hamilton is a 71 y.o. male  Seen in followup for ischemic heart disease with a previously implanted ICD for primary prevention.  He has a history of coronary artery disease, status post previous myocardial infarction, as well as redo coronary bypass grafting in 2003. He also has congestive heart failure related to an ischemic cardiomyopathy  He suffered another non-STEMI in May 2013: Underwent catheterization demonstrating patent grafts to the distal RCA and the LIMA to the vein grafts occluded. Medical therapy was recommended. Ejection fraction by echo was 40-45% may 2013  His device has reached ERI. At our last visit we discussed generator replacement. He raises the possibility of doing nothing. I have checked with [employer]; there are no and of life behaviors that would put the patient at untoward risk  He has decided after long an exhaustive review to have the device removed. We have reviewed a number of his questions today.  Past Medical History  Diagnosis Date  . Coronary artery disease     s/p MI X 2, REDO CABG 2003, MYOVIEW 12/09 EF 28% , EXTENSIVE ANTERIOR, INFERIOR AND LATERAL INFARCT. VERY MILD ANTERIOR ISCHEMIA  . CHF (congestive heart failure)     SECONDARY TO ISCHEMIC CARDIOMYOPATHY ; EF 28% BY MYOVIEW ; 50% BY ECHO 07/2008; ECHO 09/2009 EF "MILD TO MODERATELY REDUCED"  . Hypertension   . Hyperlipidemia   . Dizziness     CHRONIC; s/p EXTENSIVE EVAL, s/p EVAL @ [facility]; HEADACHE:  s/p EXTENSIVE EVAL; ON ELAVIL  . Sleep apnea 03/31/10    UNABLE TO TOLERATE CPAP; > HOME SLEEP TEST AHI 15.6  . Obesity   . Dupuytren contracture   . Nephrolithiasis   . CRI (chronic renal insufficiency)     BASELINE Cr. 1.7 ----DR. MATTINGLY  . Carotid artery occlusion     0-39% BILATERALLY W/CHRONICALLY OCCLUDED L VERTEBRAL  . Myocardial infarction   . Angina   . ICD (implantable cardiac  defibrillator) in place   . Pacemaker   . PE (pulmonary embolism) 2005    Past Surgical History  Procedure Date  . Insert / replace / remove pacemaker 2006    s/p GUIDANT IMPLANTABLE CARDIVERTER DEFIBRILLATOR  . Coronary artery bypass graft 1994    1994 AND 2003  . Cataract extraction   . Tonsillectomy     AS A CHILD  . Kidney stone surgery     Current Outpatient Prescriptions  Medication Sig Dispense Refill  . acetaminophen (TYLENOL) 500 MG tablet Take 500 mg by mouth every 6 (six) hours as needed. For pain      . amitriptyline (ELAVIL) 25 MG tablet Take 25 mg by mouth at bedtime.      Marland Kitchen aspirin 81 MG EC tablet Take 81 mg by mouth daily.        . carvedilol (COREG) 25 MG tablet Take 1 tablet (25 mg total) by mouth 2 (two) times daily with a meal.  180 tablet  3  . furosemide (LASIX) 40 MG tablet Take 40 mg by mouth daily.      . nateglinide (STARLIX) 60 MG tablet Take 60 mg by mouth 2 (two) times daily with a meal. Take with the two largest meals of the day      . nitroGLYCERIN (NITROSTAT) 0.4 MG SL tablet Place 1 tablet (0.4 mg total) under the tongue every 5 (five) minutes x 3 doses as needed  for chest pain.  25 tablet  3  . omeprazole (PRILOSEC) 40 MG capsule Take 1 capsule (40 mg total) by mouth daily.  30 capsule  11  . ramipril (ALTACE) 10 MG capsule Take 1 capsule (10 mg total) by mouth daily.  30 capsule  5  . rosuvastatin (CRESTOR) 40 MG tablet Take 40 mg by mouth daily.      Marland Kitchen spironolactone (ALDACTONE) 25 MG tablet Take 1 tablet (25 mg total) by mouth daily.  30 tablet  6  . warfarin ([name]) 3 MG tablet Take 3 mg by mouth daily.      Marland Kitchen zolpidem (AMBIEN CR) 12.5 MG CR tablet Take 12.5 mg by mouth at bedtime.      . isosorbide mononitrate (IMDUR) 60 MG 24 hr tablet Take 1 tablet (60 mg total) by mouth daily.  30 tablet  3  . DISCONTD: nitroGLYCERIN (NITROSTAT) 0.4 MG SL tablet Place 1 tablet (0.4 mg total) under the tongue every 5 (five) minutes x 3 doses as needed for  chest pain.  25 tablet  3    Allergies  Allergen Reactions  . Ezetimibe-Simvastatin     REACTION: myalgia: TOLERATES PRAVOCHOL OK    Review of Systems negative except from HPI and PMH  Physical Exam BP 104/62  Pulse 55  Ht 5\' 9"  (1.753 m)  Wt 217 lb 12.8 oz (98.793 kg)  BMI 32.16 kg/m2 Well developed and nourished in no acute distress HENT normal Neck supple with JVP-flat Clear Regular rate and rhythm, no murmurs or gallops Abd-soft with active BS No Clubbing cyanosis edema Skin-warm and dry A & Oriented  Grossly normal sensory and motor function    Assessment and  Plan

## 2012-06-25 NOTE — Patient Instructions (Addendum)
Change Coreg dose to 12.5mg  (1/2 tab) twice daily.  Your physician recommends that you schedule a follow-up appointment SOON with Dr. Ladona Ridgel to discuss removal of ICD.

## 2012-06-25 NOTE — Assessment & Plan Note (Signed)
As above. He has decided to have his device explanted. We will schedule him to see Dr. Ladona Ridgel. Because of relative bradycardia and some hypotension, we will also decrease his carvedilol from 25-12.5 mg twice daily.

## 2012-07-04 ENCOUNTER — Telehealth: Payer: Self-pay | Admitting: Internal Medicine

## 2012-07-04 NOTE — Telephone Encounter (Signed)
Reviewed with Dr Graciela Husbands. He recommended pt increase coreg to 25mg  in the morning and continue 12.5mg  in the evening. Pt advised of Dr Odessa Fleming recommendations and verbalized understanding. Pt has an appt already scheduled with Dr Ladona Ridgel next week.

## 2012-07-04 NOTE — Telephone Encounter (Signed)
Spoke with pt. Pt states he recently saw Dr Graciela Husbands and his coreg was reduced. Pt recently completed 36 sessions of Cardiac Rehab.

## 2012-07-04 NOTE — Telephone Encounter (Signed)
Pt calling re having some chest pains, he thinks his coreg dosage is to low

## 2012-07-04 NOTE — Telephone Encounter (Signed)
Pt had MI May 2013. Pt states he saw Dr Gala Romney mid-June 2013 and coreg was increased.  Pt started exercise on his own last week. He did weight lifting last week without problems. Yesterday, after about 7 minutes of bike exercise pt experienced discomfort in his chest . Pt felt his heart rate was about 126 when this happened. Pt was surprised he had chest discomfort yesterday since he had been able to exercise at Cardiac Rehab 20 minutes without symptoms.  Pt states his pre exercise BP yesterday was 110/60-70 and his heart rate in the 70s. BP had previously been in the 90/60 range before coreg was decreased 06/25/12. Pt stopped exercise yesterday when the chest discomfort began and it resolved after  about 2 minutes of rest.  Pt then continued to do weight lifting without symptoms. Pt has not had any symptoms since exercising yesterday. Pt feels his chest discomfort yesterday was related to decreasing Coreg dose. I will review with Dr Graciela Husbands.

## 2012-07-04 NOTE — Telephone Encounter (Signed)
F/u  ° °Patient returning nurse call, he can be reached at hm#  °

## 2012-07-04 NOTE — Telephone Encounter (Signed)
lmtcb

## 2012-07-05 ENCOUNTER — Other Ambulatory Visit: Payer: Self-pay | Admitting: *Deleted

## 2012-07-05 MED ORDER — ISOSORBIDE MONONITRATE ER 60 MG PO TB24: 60.0000 mg | ORAL_TABLET | Freq: Every day | ORAL | Status: DC

## 2012-07-05 NOTE — Telephone Encounter (Signed)
Fax Received. Refill Completed. Joseph Hamilton (R.M.A)   

## 2012-07-10 ENCOUNTER — Encounter: Payer: Self-pay | Admitting: Internal Medicine

## 2012-07-11 ENCOUNTER — Ambulatory Visit (INDEPENDENT_AMBULATORY_CARE_PROVIDER_SITE_OTHER): Payer: Medicare Other | Admitting: Internal Medicine

## 2012-07-11 ENCOUNTER — Encounter: Payer: Self-pay | Admitting: Internal Medicine

## 2012-07-11 VITALS — BP 96/58 | HR 83 | Ht 70.0 in | Wt 214.0 lb

## 2012-07-11 DIAGNOSIS — Z01812 Encounter for preprocedural laboratory examination: Secondary | ICD-10-CM

## 2012-07-11 DIAGNOSIS — I2589 Other forms of chronic ischemic heart disease: Secondary | ICD-10-CM

## 2012-07-11 DIAGNOSIS — Z7901 Long term (current) use of anticoagulants: Secondary | ICD-10-CM

## 2012-07-11 DIAGNOSIS — I255 Ischemic cardiomyopathy: Secondary | ICD-10-CM

## 2012-07-11 DIAGNOSIS — Z95810 Presence of automatic (implantable) cardiac defibrillator: Secondary | ICD-10-CM

## 2012-07-11 NOTE — Assessment & Plan Note (Signed)
Prior to system extraction, we will ask the patient to discontinue his warfarin for 4 days.

## 2012-07-11 NOTE — Progress Notes (Signed)
HPI Joseph Hamilton is referred by Dr. Graciela Husbands today for consideration for ICD extraction. He is a 71 year old man with a history of an ischemic cardiomyopathy, status post MI, status post bypass surgery in the past. He underwent initial ICD implantation in 2005. Since then he has had additional interventions. Most recently, his ejection fraction has normalized. He has never used his defibrillator. He would like to have it removed. Allergies  Allergen Reactions  . Ezetimibe-Simvastatin     REACTION: myalgia: TOLERATES PRAVOCHOL OK     Current Outpatient Prescriptions  Medication Sig Dispense Refill  . acetaminophen (TYLENOL) 500 MG tablet Take 500 mg by mouth every 6 (six) hours as needed. For pain      . amitriptyline (ELAVIL) 25 MG tablet Take 25 mg by mouth at bedtime.      Marland Kitchen aspirin 81 MG EC tablet Take 81 mg by mouth daily.        . carvedilol (COREG) 25 MG tablet 25mg  AM and 12.5mg  PM      . furosemide (LASIX) 40 MG tablet Take 40 mg by mouth daily.      . isosorbide mononitrate (IMDUR) 60 MG 24 hr tablet Take 1 tablet (60 mg total) by mouth daily.  30 tablet  6  . nateglinide (STARLIX) 60 MG tablet Take 60 mg by mouth 2 (two) times daily with a meal. Take with the two largest meals of the day      . nitroGLYCERIN (NITROSTAT) 0.4 MG SL tablet Place 1 tablet (0.4 mg total) under the tongue every 5 (five) minutes x 3 doses as needed for chest pain.  25 tablet  3  . omeprazole (PRILOSEC) 40 MG capsule Take 1 capsule (40 mg total) by mouth daily.  30 capsule  11  . ramipril (ALTACE) 10 MG capsule Take 1 capsule (10 mg total) by mouth daily.  30 capsule  5  . rosuvastatin (CRESTOR) 40 MG tablet Take 40 mg by mouth daily.      Marland Kitchen spironolactone (ALDACTONE) 25 MG tablet Take 1 tablet (25 mg total) by mouth daily.  30 tablet  6  . warfarin ([name]) 3 MG tablet Take 3 mg by mouth daily.      Marland Kitchen zolpidem (AMBIEN CR) 12.5 MG CR tablet Take 12.5 mg by mouth at bedtime.         Past Medical History    Diagnosis Date  . Coronary artery disease     s/p MI X 2, REDO CABG 2003, MYOVIEW 12/09 EF 28% , EXTENSIVE ANTERIOR, INFERIOR AND LATERAL INFARCT. VERY MILD ANTERIOR ISCHEMIA  . CHF (congestive heart failure)     SECONDARY TO ISCHEMIC CARDIOMYOPATHY ; EF 28% BY MYOVIEW ; 50% BY ECHO 07/2008; ECHO 09/2009 EF "MILD TO MODERATELY REDUCED"  . Hypertension   . Hyperlipidemia   . Dizziness     CHRONIC; s/p EXTENSIVE EVAL, s/p EVAL @ [facility]; HEADACHE:  s/p EXTENSIVE EVAL; ON ELAVIL  . Sleep apnea 03/31/10    UNABLE TO TOLERATE CPAP; > HOME SLEEP TEST AHI 15.6  . Obesity   . Dupuytren contracture   . Nephrolithiasis   . CRI (chronic renal insufficiency)     BASELINE Cr. 1.7 ----DR. MATTINGLY  . Carotid artery occlusion     0-39% BILATERALLY W/CHRONICALLY OCCLUDED L VERTEBRAL  . Myocardial infarction   . Angina   . ICD (implantable cardiac defibrillator) in place   . Pacemaker   . PE (pulmonary embolism) 2005    ROS:   All  systems reviewed and negative except as noted in the HPI.   Past Surgical History  Procedure Date  . Insert / replace / remove pacemaker 2006    s/p GUIDANT IMPLANTABLE CARDIVERTER DEFIBRILLATOR  . Coronary artery bypass graft 1994    1994 AND 2003  . Cataract extraction   . Tonsillectomy     AS A CHILD  . Kidney stone surgery      No family history on file.   History   Social History  . Marital Status: Divorced    Spouse Name: N/A    Number of Children: N/A  . Years of Education: N/A   Occupational History  . RETIRED     WORKS PART TIME   Social History Main Topics  . Smoking status: Former Smoker    Types: Pipe    Quit date: 09/25/1993  . Smokeless tobacco: Never Used  . Alcohol Use: Yes     1 DRINK PER DAY  . Drug Use: No  . Sexually Active: Not on file   Other Topics Concern  . Not on file   Social History Narrative   DIVORCEDLIVES BY HIMSELF3 [place] 1995ETOH 1 A DAYNO EXERCISEICD-[place]  SCIENTIFIC.Marland KitchenMarland KitchenREMOTE YES     BP 96/58  Pulse 83  Ht 5\' 10"  (1.778 m)  Wt 214 lb (97.07 kg)  BMI 30.71 kg/m2  SpO2 98%  Physical Exam:  Well appearing 71 year old man, NAD HEENT: Unremarkable Neck:  No JVD, no thyromegally Lungs:  Clear with no wheezes, rales, or rhonchi. HEART:  Regular rate rhythm, no murmurs, no rubs, no clicks Abd:  soft, positive bowel sounds, no organomegally, no rebound, no guarding Ext:  2 plus pulses, no edema, no cyanosis, no clubbing Skin:  No rashes no nodules Neuro:  CN II through XII intact, motor grossly intact   DEVICE  Normal device function.  See PaceArt for details. Device at elective replacement  Assess/Plan:

## 2012-07-11 NOTE — Assessment & Plan Note (Signed)
He denies anginal symptoms. No change in medical therapy. 

## 2012-07-11 NOTE — Assessment & Plan Note (Signed)
I have discussed the treatment options with the patient. He no longer has an indication for an ICD. One options would be to cap the leads and remove the can. Another option would be to extract the patient's entire system. The pros and cons of both approaches have been discussed in detail with the patient. He has pain at the site particularly when he turns onto his left side. His shoulder aches at times with his device in place. The patient very much wants her device to be removed in total. I've explained that he has a small risk of cardiac perforation and death. Despite this he would like to proceed. We'll schedule the procedure at a mutually convenient time.

## 2012-07-12 ENCOUNTER — Telehealth: Payer: Self-pay | Admitting: Internal Medicine

## 2012-07-12 MED ORDER — JANTOVEN 3 MG PO TABS: 3.0000 mg | ORAL_TABLET | Freq: Every day | ORAL | Status: DC

## 2012-07-12 NOTE — Telephone Encounter (Signed)
Please advise re: future refills.

## 2012-07-12 NOTE — Telephone Encounter (Signed)
Please forward to coumadin clinic- they are managing his pt/inr and refills.

## 2012-07-12 NOTE — Telephone Encounter (Signed)
Pt just left our office and verified that Coumadin is 2mg  and he takes 1 & 1/2 tablets daily for a total of 3mg .

## 2012-07-12 NOTE — Telephone Encounter (Signed)
Called and left message for pt to verify Coumadin dose.  Our flowsheet states pt uses 2mg  tablets but med list has 3mg  tablets listed.

## 2012-07-12 NOTE — Telephone Encounter (Signed)
Pt returned call.  He has the 2mg  tablets but would like to change to 3mg  tablets so he doesn't have to cut it in half.  Will send in Rx.

## 2012-07-12 NOTE — Telephone Encounter (Signed)
Refill jantoven 2 mg tav qty 30 take as directed last fill 06-18-12

## 2012-07-16 ENCOUNTER — Ambulatory Visit (INDEPENDENT_AMBULATORY_CARE_PROVIDER_SITE_OTHER): Payer: Medicare Other | Admitting: *Deleted

## 2012-07-16 DIAGNOSIS — I2699 Other pulmonary embolism without acute cor pulmonale: Secondary | ICD-10-CM

## 2012-07-16 DIAGNOSIS — Z7901 Long term (current) use of anticoagulants: Secondary | ICD-10-CM

## 2012-07-26 ENCOUNTER — Encounter: Payer: Self-pay | Admitting: *Deleted

## 2012-07-26 ENCOUNTER — Other Ambulatory Visit: Payer: Self-pay | Admitting: *Deleted

## 2012-07-26 DIAGNOSIS — I2782 Chronic pulmonary embolism: Secondary | ICD-10-CM

## 2012-07-26 DIAGNOSIS — Z01812 Encounter for preprocedural laboratory examination: Secondary | ICD-10-CM

## 2012-07-26 DIAGNOSIS — T82198A Other mechanical complication of other cardiac electronic device, initial encounter: Secondary | ICD-10-CM

## 2012-07-26 DIAGNOSIS — I2699 Other pulmonary embolism without acute cor pulmonale: Secondary | ICD-10-CM

## 2012-07-27 ENCOUNTER — Other Ambulatory Visit: Payer: Self-pay | Admitting: Internal Medicine

## 2012-07-29 NOTE — Telephone Encounter (Signed)
Please advise re: refills:  Medication name:  Name from pharmacy:  zolpidem (AMBIEN CR) 12.5 MG CR tablet  ZOLPIDEM TARTRATE 12.5MG  TER Sig: TAKE ONE TABLET DAILY AT BEDTIME Dispense: 30 tablet (Pharmacy requested 30 each) Start: 07/27/2012 Class: Normal Originally ordered on: 02/11/2012 Last refill: 07/01/2012

## 2012-07-29 NOTE — Telephone Encounter (Signed)
#  [phone] [id number] 

## 2012-07-30 NOTE — Telephone Encounter (Signed)
Refill given to Shanda Bumps at [facility].

## 2012-08-16 ENCOUNTER — Telehealth: Payer: Self-pay | Admitting: Internal Medicine

## 2012-08-16 NOTE — Telephone Encounter (Signed)
New problem:   Has question regarding letter he receive - upcoming procedure.

## 2012-08-16 NOTE — Telephone Encounter (Signed)
Will have labs drawn same time as Coumadin check

## 2012-08-20 ENCOUNTER — Other Ambulatory Visit (INDEPENDENT_AMBULATORY_CARE_PROVIDER_SITE_OTHER): Payer: Medicare Other

## 2012-08-20 ENCOUNTER — Ambulatory Visit (INDEPENDENT_AMBULATORY_CARE_PROVIDER_SITE_OTHER): Payer: Medicare Other

## 2012-08-20 DIAGNOSIS — R0989 Other specified symptoms and signs involving the circulatory and respiratory systems: Secondary | ICD-10-CM

## 2012-08-20 DIAGNOSIS — Z7901 Long term (current) use of anticoagulants: Secondary | ICD-10-CM

## 2012-08-20 DIAGNOSIS — Z01812 Encounter for preprocedural laboratory examination: Secondary | ICD-10-CM

## 2012-08-20 DIAGNOSIS — I2699 Other pulmonary embolism without acute cor pulmonale: Secondary | ICD-10-CM

## 2012-08-20 DIAGNOSIS — T82198A Other mechanical complication of other cardiac electronic device, initial encounter: Secondary | ICD-10-CM

## 2012-08-20 LAB — BASIC METABOLIC PANEL
CO2: 23 mEq/L (ref 19–32)
Glucose, Bld: 158 mg/dL — ABNORMAL HIGH (ref 70–99)
Potassium: 5 mEq/L (ref 3.5–5.1)
Sodium: 134 mEq/L — ABNORMAL LOW (ref 135–145)

## 2012-08-20 LAB — CBC WITH DIFFERENTIAL/PLATELET
Basophils Relative: 0.5 % (ref 0.0–3.0)
Eosinophils Relative: 2.9 % (ref 0.0–5.0)
Lymphocytes Relative: 18.8 % (ref 12.0–46.0)
MCV: 95.2 fl (ref 78.0–100.0)
Neutrophils Relative %: 67.4 % (ref 43.0–77.0)
RBC: 4 Mil/uL — ABNORMAL LOW (ref 4.22–5.81)
WBC: 6.3 10*3/uL (ref 4.5–10.5)

## 2012-08-20 LAB — PROTIME-INR: INR: 2.6 ratio — ABNORMAL HIGH (ref 0.8–1.0)

## 2012-08-27 ENCOUNTER — Encounter (HOSPITAL_COMMUNITY): Payer: Self-pay | Admitting: *Deleted

## 2012-08-27 ENCOUNTER — Encounter (HOSPITAL_COMMUNITY): Payer: Self-pay | Admitting: Pharmacy Technician

## 2012-08-27 MED ORDER — SODIUM CHLORIDE 0.9 % IJ SOLN: 3.0000 mL | INTRAMUSCULAR | Status: DC | PRN

## 2012-08-27 MED ORDER — SODIUM CHLORIDE 0.9 % IV SOLN: 250.0000 mL | INTRAVENOUS | Status: DC

## 2012-08-27 MED ORDER — SODIUM CHLORIDE 0.9 % IR SOLN
80.0000 mg | Status: DC
  Filled 2012-08-27: qty 2

## 2012-08-27 MED ORDER — SODIUM CHLORIDE 0.9 % IJ SOLN: 3.0000 mL | Freq: Two times a day (BID) | INTRAMUSCULAR | Status: DC

## 2012-08-27 MED ORDER — CEFAZOLIN SODIUM-DEXTROSE 2-3 GM-% IV SOLR: 2.0000 g | INTRAVENOUS | Status: DC

## 2012-08-27 MED ORDER — SODIUM CHLORIDE 0.45 % IV SOLN: INTRAVENOUS | Status: DC

## 2012-08-27 MED ORDER — CHLORHEXIDINE GLUCONATE 4 % EX LIQD: 60.0000 mL | Freq: Once | CUTANEOUS | Status: DC

## 2012-08-28 ENCOUNTER — Encounter (HOSPITAL_COMMUNITY): Payer: Self-pay | Admitting: Anesthesiology

## 2012-08-28 ENCOUNTER — Ambulatory Visit (HOSPITAL_COMMUNITY): Payer: Medicare Other | Admitting: Anesthesiology

## 2012-08-28 ENCOUNTER — Encounter (HOSPITAL_COMMUNITY): Payer: Self-pay | Admitting: *Deleted

## 2012-08-28 ENCOUNTER — Ambulatory Visit (HOSPITAL_COMMUNITY): Payer: Medicare Other

## 2012-08-28 ENCOUNTER — Encounter (HOSPITAL_COMMUNITY)
Admission: RE | Disposition: A | Discharge status: Home / Self Care | Payer: Self-pay | Source: Ambulatory Visit | Attending: Internal Medicine

## 2012-08-28 ENCOUNTER — Observation Stay (HOSPITAL_COMMUNITY)
Admission: RE | Admit: 2012-08-28 | Discharge: 2012-08-29 | Disposition: A | Discharge status: Home / Self Care | Payer: Medicare Other | Source: Ambulatory Visit | Attending: Internal Medicine | Admitting: Internal Medicine

## 2012-08-28 DIAGNOSIS — I509 Heart failure, unspecified: Secondary | ICD-10-CM | POA: Insufficient documentation

## 2012-08-28 DIAGNOSIS — I2589 Other forms of chronic ischemic heart disease: Secondary | ICD-10-CM

## 2012-08-28 DIAGNOSIS — E785 Hyperlipidemia, unspecified: Secondary | ICD-10-CM | POA: Insufficient documentation

## 2012-08-28 DIAGNOSIS — T827XXA Infection and inflammatory reaction due to other cardiac and vascular devices, implants and grafts, initial encounter: Secondary | ICD-10-CM

## 2012-08-28 DIAGNOSIS — Z79899 Other long term (current) drug therapy: Secondary | ICD-10-CM | POA: Insufficient documentation

## 2012-08-28 DIAGNOSIS — I10 Essential (primary) hypertension: Secondary | ICD-10-CM | POA: Insufficient documentation

## 2012-08-28 DIAGNOSIS — T82198A Other mechanical complication of other cardiac electronic device, initial encounter: Secondary | ICD-10-CM

## 2012-08-28 DIAGNOSIS — Z4502 Encounter for adjustment and management of automatic implantable cardiac defibrillator: Principal | ICD-10-CM | POA: Insufficient documentation

## 2012-08-28 HISTORY — DX: Unspecified osteoarthritis, unspecified site: M19.90

## 2012-08-28 HISTORY — PX: PACEMAKER LEAD REMOVAL: SHX5064

## 2012-08-28 HISTORY — DX: Gastro-esophageal reflux disease without esophagitis: K21.9

## 2012-08-28 LAB — CBC
Hemoglobin: 13.1 g/dL (ref 13.0–17.0)
MCV: 92.1 fL (ref 78.0–100.0)
Platelets: 156 10*3/uL (ref 150–400)
RBC: 4.16 MIL/uL — ABNORMAL LOW (ref 4.22–5.81)
WBC: 7.5 10*3/uL (ref 4.0–10.5)

## 2012-08-28 LAB — BASIC METABOLIC PANEL
CO2: 21 mEq/L (ref 19–32)
Chloride: 107 mEq/L (ref 96–112)
Creatinine, Ser: 2.08 mg/dL — ABNORMAL HIGH (ref 0.50–1.35)
Glucose, Bld: 120 mg/dL — ABNORMAL HIGH (ref 70–99)

## 2012-08-28 LAB — SURGICAL PCR SCREEN
MRSA, PCR: NEGATIVE
Staphylococcus aureus: POSITIVE — AB

## 2012-08-28 LAB — PROTIME-INR: INR: 1.34 (ref 0.00–1.49)

## 2012-08-28 SURGERY — REMOVAL, ELECTRODE LEAD, CARDIAC PACEMAKER, WITHOUT REPLACEMENT
Anesthesia: General | Site: Chest | Wound class: Clean

## 2012-08-28 MED ORDER — GENTAMICIN SULFATE 40 MG/ML IJ SOLN
INTRAMUSCULAR | Status: AC
  Filled 2012-08-28: qty 8

## 2012-08-28 MED ORDER — PROPOFOL 10 MG/ML IV BOLUS
INTRAVENOUS | Status: DC | PRN
  Administered 2012-08-28: 150 mg via INTRAVENOUS

## 2012-08-28 MED ORDER — MIDAZOLAM HCL 5 MG/5ML IJ SOLN
INTRAMUSCULAR | Status: DC | PRN
  Administered 2012-08-28 (×2): 1 mg via INTRAVENOUS
  Administered 2012-08-28: 1 mg
  Administered 2012-08-28: 2 mg via INTRAVENOUS

## 2012-08-28 MED ORDER — DEXAMETHASONE SODIUM PHOSPHATE 4 MG/ML IJ SOLN
INTRAMUSCULAR | Status: DC | PRN
  Administered 2012-08-28: 4 mg via INTRAVENOUS

## 2012-08-28 MED ORDER — PANTOPRAZOLE SODIUM 40 MG PO TBEC
40.0000 mg | DELAYED_RELEASE_TABLET | Freq: Every day | ORAL | Status: DC
  Administered 2012-08-29: 40 mg via ORAL
  Filled 2012-08-28: qty 1

## 2012-08-28 MED ORDER — FENTANYL CITRATE 0.05 MG/ML IJ SOLN
INTRAMUSCULAR | Status: DC | PRN
  Administered 2012-08-28 (×3): 50 ug via INTRAVENOUS
  Administered 2012-08-28: 100 ug via INTRAVENOUS
  Administered 2012-08-28 (×3): 50 ug via INTRAVENOUS

## 2012-08-28 MED ORDER — NATEGLINIDE 60 MG PO TABS
60.0000 mg | ORAL_TABLET | Freq: Two times a day (BID) | ORAL | Status: DC
  Administered 2012-08-29: 60 mg via ORAL
  Filled 2012-08-28 (×4): qty 1

## 2012-08-28 MED ORDER — VECURONIUM BROMIDE 10 MG IV SOLR
INTRAVENOUS | Status: DC | PRN
  Administered 2012-08-28: 2 mg via INTRAVENOUS
  Administered 2012-08-28: 1 mg via INTRAVENOUS

## 2012-08-28 MED ORDER — MIDAZOLAM HCL 2 MG/2ML IJ SOLN
INTRAMUSCULAR | Status: AC
  Filled 2012-08-28: qty 2

## 2012-08-28 MED ORDER — GLYCOPYRROLATE 0.2 MG/ML IJ SOLN
INTRAMUSCULAR | Status: DC | PRN
  Administered 2012-08-28: 0.6 mg via INTRAVENOUS

## 2012-08-28 MED ORDER — ISOSORBIDE MONONITRATE ER 60 MG PO TB24
60.0000 mg | ORAL_TABLET | Freq: Every day | ORAL | Status: DC
  Administered 2012-08-29: 60 mg via ORAL
  Filled 2012-08-28: qty 1

## 2012-08-28 MED ORDER — LIDOCAINE HCL (PF) 1 % IJ SOLN
INTRAMUSCULAR | Status: AC
  Filled 2012-08-28: qty 30

## 2012-08-28 MED ORDER — FENTANYL CITRATE 0.05 MG/ML IJ SOLN
100.0000 ug | Freq: Once | INTRAMUSCULAR | Status: AC
  Administered 2012-08-28 (×2): 25 ug via INTRAVENOUS
  Administered 2012-08-28: 50 ug via INTRAVENOUS

## 2012-08-28 MED ORDER — SPIRONOLACTONE 25 MG PO TABS: 25.0000 mg | ORAL_TABLET | Freq: Every day | ORAL | Status: DC

## 2012-08-28 MED ORDER — WARFARIN SODIUM 3 MG PO TABS
3.0000 mg | ORAL_TABLET | Freq: Every day | ORAL | Status: DC
  Administered 2012-08-28: 3 mg via ORAL
  Filled 2012-08-28 (×2): qty 1

## 2012-08-28 MED ORDER — ASPIRIN 81 MG PO TBEC: 81.0000 mg | DELAYED_RELEASE_TABLET | Freq: Every day | ORAL | Status: DC

## 2012-08-28 MED ORDER — ROCURONIUM BROMIDE 100 MG/10ML IV SOLN
INTRAVENOUS | Status: DC | PRN
  Administered 2012-08-28: 50 mg via INTRAVENOUS

## 2012-08-28 MED ORDER — FUROSEMIDE 40 MG PO TABS
40.0000 mg | ORAL_TABLET | Freq: Every day | ORAL | Status: DC
Start: 2012-08-29 — End: 2012-08-29
  Administered 2012-08-29: 40 mg via ORAL
  Filled 2012-08-28: qty 1

## 2012-08-28 MED ORDER — DEXTROSE 5 % IV SOLN
INTRAVENOUS | Status: DC | PRN
  Administered 2012-08-28: 16:00:00 via INTRAVENOUS

## 2012-08-28 MED ORDER — AMITRIPTYLINE HCL 25 MG PO TABS
25.0000 mg | ORAL_TABLET | Freq: Every day | ORAL | Status: DC
  Administered 2012-08-28: 25 mg via ORAL
  Filled 2012-08-28 (×2): qty 1

## 2012-08-28 MED ORDER — ZOLPIDEM TARTRATE 5 MG PO TABS
5.0000 mg | ORAL_TABLET | Freq: Every evening | ORAL | Status: DC | PRN
  Administered 2012-08-28: 5 mg via ORAL
  Filled 2012-08-28: qty 1

## 2012-08-28 MED ORDER — HYDROMORPHONE HCL PF 1 MG/ML IJ SOLN: 0.2500 mg | INTRAMUSCULAR | Status: DC | PRN

## 2012-08-28 MED ORDER — LACTATED RINGERS IV SOLN
INTRAVENOUS | Status: DC | PRN
  Administered 2012-08-28 (×2): via INTRAVENOUS

## 2012-08-28 MED ORDER — LIDOCAINE HCL (CARDIAC) 20 MG/ML IV SOLN: INTRAVENOUS | Status: DC | PRN

## 2012-08-28 MED ORDER — CARVEDILOL 25 MG PO TABS: 25.0000 mg | ORAL_TABLET | Freq: Two times a day (BID) | ORAL | Status: DC

## 2012-08-28 MED ORDER — MIDAZOLAM HCL 2 MG/2ML IJ SOLN: 1.0000 mg | Freq: Once | INTRAMUSCULAR | Status: DC

## 2012-08-28 MED ORDER — NITROGLYCERIN 0.4 MG SL SUBL
0.4000 mg | SUBLINGUAL_TABLET | SUBLINGUAL | Status: DC | PRN
  Administered 2012-08-29: 0.4 mg via SUBLINGUAL
  Filled 2012-08-28: qty 25

## 2012-08-28 MED ORDER — MUPIROCIN 2 % EX OINT
TOPICAL_OINTMENT | CUTANEOUS | Status: AC
  Administered 2012-08-28: 1
  Filled 2012-08-28: qty 22

## 2012-08-28 MED ORDER — LIDOCAINE HCL (CARDIAC) 20 MG/ML IV SOLN
INTRAVENOUS | Status: DC | PRN
  Administered 2012-08-28: 60 mg via INTRAVENOUS

## 2012-08-28 MED ORDER — FENTANYL CITRATE 0.05 MG/ML IJ SOLN
INTRAMUSCULAR | Status: AC
  Filled 2012-08-28: qty 2

## 2012-08-28 MED ORDER — ATORVASTATIN CALCIUM 10 MG PO TABS
10.0000 mg | ORAL_TABLET | Freq: Every day | ORAL | Status: DC
  Administered 2012-08-28: 10 mg via ORAL
  Filled 2012-08-28 (×2): qty 1

## 2012-08-28 MED ORDER — SODIUM CHLORIDE 0.9 % IV SOLN
10.0000 mg | INTRAVENOUS | Status: DC | PRN
  Administered 2012-08-28: 20 ug/min via INTRAVENOUS

## 2012-08-28 MED ORDER — CEFAZOLIN SODIUM-DEXTROSE 2-3 GM-% IV SOLR
INTRAVENOUS | Status: AC
  Administered 2012-08-28: 2 g via INTRAVENOUS
  Filled 2012-08-28: qty 50

## 2012-08-28 MED ORDER — ARTIFICIAL TEARS OP OINT
TOPICAL_OINTMENT | OPHTHALMIC | Status: DC | PRN
  Administered 2012-08-28: 1 via OPHTHALMIC

## 2012-08-28 MED ORDER — RAMIPRIL 10 MG PO CAPS: 10.0000 mg | ORAL_CAPSULE | Freq: Every day | ORAL | Status: DC

## 2012-08-28 MED ORDER — SODIUM CHLORIDE 0.9 % IR SOLN
Status: DC | PRN
  Administered 2012-08-28: 18:00:00

## 2012-08-28 MED ORDER — MIDAZOLAM HCL 5 MG/ML IJ SOLN
2.0000 mg | Freq: Once | INTRAMUSCULAR | Status: AC
  Administered 2012-08-28 (×2): 1 mg via INTRAVENOUS

## 2012-08-28 MED ORDER — SODIUM CHLORIDE 0.9 % IJ SOLN
3.0000 mL | Freq: Two times a day (BID) | INTRAMUSCULAR | Status: DC
  Administered 2012-08-28: 3 mL via INTRAVENOUS

## 2012-08-28 MED ORDER — ASPIRIN EC 81 MG PO TBEC
81.0000 mg | DELAYED_RELEASE_TABLET | Freq: Every day | ORAL | Status: DC
  Administered 2012-08-28 – 2012-08-29 (×2): 81 mg via ORAL
  Filled 2012-08-28 (×2): qty 1

## 2012-08-28 MED ORDER — ACETAMINOPHEN 10 MG/ML IV SOLN: 1000.0000 mg | Freq: Once | INTRAVENOUS | Status: DC | PRN

## 2012-08-28 MED ORDER — WARFARIN - PHYSICIAN DOSING INPATIENT: Freq: Every day | Status: DC

## 2012-08-28 MED ORDER — LACTATED RINGERS IV SOLN
INTRAVENOUS | Status: DC
  Administered 2012-08-28: 14:00:00 via INTRAVENOUS

## 2012-08-28 MED ORDER — ONDANSETRON HCL 4 MG/2ML IJ SOLN: 4.0000 mg | Freq: Once | INTRAMUSCULAR | Status: DC | PRN

## 2012-08-28 MED ORDER — CARVEDILOL 12.5 MG PO TABS
12.5000 mg | ORAL_TABLET | Freq: Every day | ORAL | Status: DC
  Filled 2012-08-28: qty 1

## 2012-08-28 MED ORDER — CARVEDILOL 25 MG PO TABS
25.0000 mg | ORAL_TABLET | Freq: Every day | ORAL | Status: DC
  Administered 2012-08-29: 25 mg via ORAL
  Filled 2012-08-28 (×2): qty 1

## 2012-08-28 MED ORDER — HYDROCODONE-ACETAMINOPHEN 5-325 MG PO TABS
1.0000 | ORAL_TABLET | ORAL | Status: DC | PRN
  Administered 2012-08-28 – 2012-08-29 (×2): 1 via ORAL
  Filled 2012-08-28 (×2): qty 1

## 2012-08-28 MED ORDER — ONDANSETRON HCL 4 MG/2ML IJ SOLN
INTRAMUSCULAR | Status: DC | PRN
  Administered 2012-08-28: 4 mg via INTRAVENOUS

## 2012-08-28 MED ORDER — NEOSTIGMINE METHYLSULFATE 1 MG/ML IJ SOLN
INTRAMUSCULAR | Status: DC | PRN
  Administered 2012-08-28: 5 mg via INTRAVENOUS

## 2012-08-28 MED ORDER — SODIUM CHLORIDE 0.9 % IR SOLN
Status: DC | PRN
  Administered 2012-08-28: 17:00:00

## 2012-08-28 SURGICAL SUPPLY — 33 items
BAG BANDED W/RUBBER/TAPE 36X54 (MISCELLANEOUS) ×2 IMPLANT
CANISTER SUCTION 2500CC (MISCELLANEOUS) ×2 IMPLANT
CLOTH BEACON ORANGE TIMEOUT ST (SAFETY) ×2 IMPLANT
CLSR STERI-STRIP ANTIMIC 1/2X4 (GAUZE/BANDAGES/DRESSINGS) ×2 IMPLANT
DRAPE C-ARM 42X72 X-RAY (DRAPES) ×2 IMPLANT
DRAPE CARDIOVASCULAR INCISE (DRAPES) ×1
DRAPE INCISE IOBAN 66X45 STRL (DRAPES) ×2 IMPLANT
DRAPE PROXIMA HALF (DRAPES) IMPLANT
DRAPE SRG 135X102X78XABS (DRAPES) ×1 IMPLANT
DRAPE TABLE COVER HEAVY DUTY (DRAPES) ×2 IMPLANT
DRSG OPSITE 6X11 MED (GAUZE/BANDAGES/DRESSINGS) IMPLANT
DRSG TEGADERM 4X4.75 (GAUZE/BANDAGES/DRESSINGS) ×2 IMPLANT
ELECT REM PT RETURN 9FT ADLT (ELECTROSURGICAL) ×2
ELECTRODE REM PT RTRN 9FT ADLT (ELECTROSURGICAL) ×1 IMPLANT
GAUZE SPONGE 4X4 16PLY XRAY LF (GAUZE/BANDAGES/DRESSINGS) IMPLANT
GLOVE BIO SURGEON STRL SZ7 (GLOVE) ×2 IMPLANT
GLOVE BIO SURGEON STRL SZ8 (GLOVE) ×2 IMPLANT
GLOVE BIOGEL PI IND STRL 7.5 (GLOVE) ×1 IMPLANT
GLOVE BIOGEL PI INDICATOR 7.5 (GLOVE) ×1
GOWN STRL NON-REIN LRG LVL3 (GOWN DISPOSABLE) ×4 IMPLANT
KIT ROOM TURNOVER OR (KITS) ×2 IMPLANT
PACK CHEST (CUSTOM PROCEDURE TRAY) IMPLANT
PAD ARMBOARD 7.5X6 YLW CONV (MISCELLANEOUS) ×4 IMPLANT
PENCIL BUTTON HOLSTER BLD 10FT (ELECTRODE) IMPLANT
SPONGE GAUZE 4X4 12PLY (GAUZE/BANDAGES/DRESSINGS) ×2 IMPLANT
SUT PROLENE 2 0 CT2 30 (SUTURE) IMPLANT
SUT PROLENE 2 0 SH DA (SUTURE) ×4 IMPLANT
SUT SILK 0 FSL (SUTURE) IMPLANT
SUT VIC AB 2-0 CT2 18 VCP726D (SUTURE) IMPLANT
SUT VIC AB 3-0 X1 27 (SUTURE) IMPLANT
TOWEL OR 17X24 6PK STRL BLUE (TOWEL DISPOSABLE) ×4 IMPLANT
TUBE CONNECTING 12X1/4 (SUCTIONS) IMPLANT
YANKAUER SUCT BULB TIP NO VENT (SUCTIONS) IMPLANT

## 2012-08-28 NOTE — H&P (Signed)
HPI  Joseph Hamilton presents today for ICD system extraction. He is a 71 year old man with a history of an ischemic cardiomyopathy, status post MI, status post bypass surgery in the past. He underwent initial ICD implantation in 2005. Since then he has had additional interventions. Most recently, his ejection fraction has normalized. He has never used his defibrillator. He would like to have it removed.  Allergies   Allergen  Reactions   .  Ezetimibe-Simvastatin      REACTION: myalgia: TOLERATES PRAVOCHOL OK    Current Outpatient Prescriptions   Medication  Sig  Dispense  Refill   .  acetaminophen (TYLENOL) 500 MG tablet  Take 500 mg by mouth every 6 (six) hours as needed. For pain     .  amitriptyline (ELAVIL) 25 MG tablet  Take 25 mg by mouth at bedtime.     Marland Kitchen  aspirin 81 MG EC tablet  Take 81 mg by mouth daily.     .  carvedilol (COREG) 25 MG tablet  25mg  AM and 12.5mg  PM     .  furosemide (LASIX) 40 MG tablet  Take 40 mg by mouth daily.     .  isosorbide mononitrate (IMDUR) 60 MG 24 hr tablet  Take 1 tablet (60 mg total) by mouth daily.  30 tablet  6   .  nateglinide (STARLIX) 60 MG tablet  Take 60 mg by mouth 2 (two) times daily with a meal. Take with the two largest meals of the day     .  nitroGLYCERIN (NITROSTAT) 0.4 MG SL tablet  Place 1 tablet (0.4 mg total) under the tongue every 5 (five) minutes x 3 doses as needed for chest pain.  25 tablet  3   .  omeprazole (PRILOSEC) 40 MG capsule  Take 1 capsule (40 mg total) by mouth daily.  30 capsule  11   .  ramipril (ALTACE) 10 MG capsule  Take 1 capsule (10 mg total) by mouth daily.  30 capsule  5   .  rosuvastatin (CRESTOR) 40 MG tablet  Take 40 mg by mouth daily.     Marland Kitchen  spironolactone (ALDACTONE) 25 MG tablet  Take 1 tablet (25 mg total) by mouth daily.  30 tablet  6   .  warfarin ([name]) 3 MG tablet  Take 3 mg by mouth daily.     Marland Kitchen  zolpidem (AMBIEN CR) 12.5 MG CR tablet  Take 12.5 mg by mouth at bedtime.      Past Medical History    Diagnosis  Date   .  Coronary artery disease      s/p MI X 2, REDO CABG 2003, MYOVIEW 12/09 EF 28% , EXTENSIVE ANTERIOR, INFERIOR AND LATERAL INFARCT. VERY MILD ANTERIOR ISCHEMIA   .  CHF (congestive heart failure)      SECONDARY TO ISCHEMIC CARDIOMYOPATHY ; EF 28% BY MYOVIEW ; 50% BY ECHO 07/2008; ECHO 09/2009 EF "MILD TO MODERATELY REDUCED"   .  Hypertension    .  Hyperlipidemia    .  Dizziness      CHRONIC; s/p EXTENSIVE EVAL, s/p EVAL @ [facility]; HEADACHE: s/p EXTENSIVE EVAL; ON ELAVIL   .  Sleep apnea  03/31/10     UNABLE TO TOLERATE CPAP; > HOME SLEEP TEST AHI 15.6   .  Obesity    .  Dupuytren contracture    .  Nephrolithiasis    .  CRI (chronic renal insufficiency)      BASELINE Cr. 1.7 ----  DR. Briant Cedar   .  Carotid artery occlusion      0-39% BILATERALLY W/CHRONICALLY OCCLUDED L VERTEBRAL   .  Myocardial infarction    .  Angina    .  ICD (implantable cardiac defibrillator) in place    .  Pacemaker    .  PE (pulmonary embolism)  2005    ROS:  All systems reviewed and negative except as noted in the HPI.  Past Surgical History   Procedure  Date   .  Insert / replace / remove pacemaker  2006     s/p GUIDANT IMPLANTABLE CARDIVERTER DEFIBRILLATOR   .  Coronary artery bypass graft  1994     1994 AND 2003   .  Cataract extraction    .  Tonsillectomy      AS A CHILD   .  Kidney stone surgery     No family history on file.  History    Social History   .  Marital Status:  Divorced     Spouse Name:  N/A     Number of Children:  N/A   .  Years of Education:  N/A    Occupational History   .  RETIRED      WORKS PART TIME    Social History Main Topics   .  Smoking status:  Former Smoker     Types:  Pipe     Quit date:  09/25/1993   .  Smokeless tobacco:  Never Used   .  Alcohol Use:  Yes      1 DRINK PER DAY   .  Drug Use:  No   .  Sexually Active:  Not on file    Other Topics  Concern   .  Not on file    Social History Narrative    DIVORCEDLIVES BY HIMSELF3  [place] 1995ETOH 1 A DAYNO EXERCISEICD-[employer].Marland KitchenMarland KitchenREMOTE YES    BP 96/58  Pulse 83  Ht 5\' 10"  (1.778 m)  Wt 214 lb (97.07 kg)  BMI 30.71 kg/m2  SpO2 98%  Physical Exam:  Well appearing 71 year old man, NAD  HEENT: Unremarkable  Neck: No JVD, no thyromegally  Lungs: Clear with no wheezes, rales, or rhonchi.  HEART: Regular rate rhythm, no murmurs, no rubs, no clicks  Abd: soft, positive bowel sounds, no organomegally, no rebound, no guarding  Ext: 2 plus pulses, no edema, no cyanosis, no clubbing  Skin: No rashes no nodules  Neuro: CN II through XII intact, motor grossly intact  DEVICE  Normal device function. See PaceArt for details. Device at elective replacement  Assess/Plan:  ICD-[employer] - [name], MD 07/11/2012 4:09 PM Signed  I have discussed the treatment options with the patient. He no longer has an indication for an ICD. One options would be to cap the leads and remove the can. Another option would be to extract the patient's entire system. The pros and cons of both approaches have been discussed in detail with the patient. He has pain at the site particularly when he turns onto his left side. His shoulder aches at times with his device in place. The patient very much wants her device to be removed in total. I've explained that he has a small risk of cardiac perforation and death. Despite this he would like to proceed. We'll schedule the procedure at a mutually convenient time. Ischemic cardiomyopathy - [name], MD 07/11/2012 4:11 PM Signed  He denies anginal symptoms.  No change in medical therapy. Long term (current) use of anticoagulants - [name], MD 07/11/2012 4:11 PM Signed  Prior to system extraction, we will ask the patient to discontinue his warfarin for 4 days.  EP Attending  Patient seen and examined. Since his clinic visit 6 weeks ago, there has been no change in the history, physical exam,  assessment and plan. Will proceed with lead extraction, pending lab results and cxr.   [name], M.D.

## 2012-08-28 NOTE — Transfer of Care (Signed)
Immediate Anesthesia Transfer of Care Note  Patient: Joseph Hamilton  Procedure(s) Performed: Procedure(s) (LRB) with comments: PACEMAKER LEAD REMOVAL (N/A)  Patient Location: PACU  Anesthesia Type:General  Level of Consciousness: sedated, patient cooperative and responds to stimulation  Airway & Oxygen Therapy: Patient Spontanous Breathing and Patient connected to nasal cannula oxygen  Post-op Assessment: Report given to PACU RN, Post -op Vital signs reviewed and stable, Patient moving all extremities and Patient moving all extremities X 4  Post vital signs: Reviewed and stable  Complications: No apparent anesthesia complications

## 2012-08-28 NOTE — Progress Notes (Signed)
08/28/2012 2200 Pt with K+ 5.7 today. Pt is due to receive Altace and Aldactone.  Attempted to page MD on call with no call back.  These medications held tonight. Laren Whaling, Avie Echevaria , RN

## 2012-08-28 NOTE — Anesthesia Preprocedure Evaluation (Addendum)
Anesthesia Evaluation  Patient identified by MRN, date of birth, ID band Patient awake    Reviewed: Allergy & Precautions, H&P , NPO status , Patient's Chart, lab work & pertinent test results  Airway Mallampati: II      Dental  (+) Teeth Intact and Dental Advisory Given   Pulmonary  breath sounds clear to auscultation        Cardiovascular Rhythm:Regular Rate:Normal     Neuro/Psych    GI/Hepatic   Endo/Other    Renal/GU      Musculoskeletal   Abdominal   Peds  Hematology   Anesthesia Other Findings   Reproductive/Obstetrics                           Anesthesia Physical Anesthesia Plan  ASA: III  Anesthesia Plan: General   Post-op Pain Management:    Induction: Intravenous  Airway Management Planned: Oral ETT  Additional Equipment: Arterial line  Intra-op Plan:   Post-operative Plan: Extubation in OR  Informed Consent: I have reviewed the patients History and Physical, chart, labs and discussed the procedure including the risks, benefits and alternatives for the proposed anesthesia with the patient or authorized representative who has indicated his/her understanding and acceptance.   Dental advisory given  Plan Discussed with: CRNA and Surgeon  Anesthesia Plan Comments: (CAD S/P CABG in 1994 and 2003 with ischemic cardiomyopathy AICD inserted in 2005. Has never discharged and patient wants the AICD removed. Renal insufficiency Cr 2.08 K-5.7 Htn Type 2 DM 120 H/o PULMONARY EMBOLISM 2005 ON COUMADIN INR 1.34 Obesity Sleep apnea  Plan GA with art line  Joseph Brood, MD)      Anesthesia Quick Evaluation

## 2012-08-28 NOTE — Anesthesia Postprocedure Evaluation (Signed)
Anesthesia Post Note  Patient: Joseph Hamilton  Procedure(s) Performed: Procedure(s) (LRB): PACEMAKER LEAD REMOVAL (N/A)  Anesthesia type: general  Patient location: PACU  Post pain: Pain level controlled  Post assessment: Patient's Cardiovascular Status Stable  Last Vitals:  Filed Vitals:   08/28/12 1945  BP: 130/77  Pulse: 70  Temp:   Resp: 16    Post vital signs: Reviewed and stable  Level of consciousness: sedated  Complications: No apparent anesthesia complications

## 2012-08-28 NOTE — OR Nursing (Signed)
Floro time 14.4 and 202 mGy.

## 2012-08-28 NOTE — Progress Notes (Signed)
Spoke with Dr Ladona Ridgel regarding May  Chest xray...Marland Kitchenhe will get back to me...Marland KitchenDA

## 2012-08-28 NOTE — Anesthesia Procedure Notes (Signed)
Procedure Name: Intubation Date/Time: 08/28/2012 4:32 PM Performed by: Wray Kearns A Pre-anesthesia Checklist: Patient identified, Timeout performed, Emergency Drugs available, Suction available and Patient being monitored Patient Re-evaluated:Patient Re-evaluated prior to inductionOxygen Delivery Method: Circle system utilized Preoxygenation: Pre-oxygenation with 100% oxygen Intubation Type: IV induction and Cricoid Pressure applied Ventilation: Mask ventilation without difficulty Laryngoscope Size: Mac and 4 Grade View: Grade I Tube type: Oral Tube size: 8.5 mm Number of attempts: 1 Airway Equipment and Method: Stylet Placement Confirmation: ETT inserted through vocal cords under direct vision,  positive ETCO2 and breath sounds checked- equal and bilateral Secured at: 22 cm Tube secured with: Tape Dental Injury: Teeth and Oropharynx as per pre-operative assessment

## 2012-08-28 NOTE — Anesthesia Postprocedure Evaluation (Signed)
Anesthesia Post Note  Patient: Joseph Hamilton  Procedure(s) Performed: Procedure(s) (LRB): PACEMAKER LEAD REMOVAL (N/A)  Anesthesia type: general  Patient location: PACU  Post pain: Pain level controlled  Post assessment: Patient's Cardiovascular Status Stable  Last Vitals:  Filed Vitals:   08/28/12 1930  BP: 137/95  Pulse: 79  Temp:   Resp: 15    Post vital signs: Reviewed and stable  Level of consciousness: sedated  Complications: No apparent anesthesia complications

## 2012-08-28 NOTE — Progress Notes (Signed)
PHARMACIST - PHYSICIAN COMMUNICATION DR: Ladona Ridgel CONCERNING: Pharmacy Care Issues Regarding Warfarin Labs  RECOMMENDATION (Action Taken): A baseline and daily protime for three days has been ordered to meet the [facility] Patient safety goal and comply with the current [facility] Pharmacy & Therapeutics Committee policy.   The Pharmacy will defer all warfarin dose order changes and follow up of lab results to the prescriber unless an additional order to initiate a "pharmacy Coumadin consult" is placed.  DESCRIPTION:  While hospitalized, to be in compliance with The Joint Commission National Patient Safety Goals, all patients on warfarin must have a baseline and/or current protime prior to the administration of warfarin. Pharmacy has received your order for warfarin without these required laboratory assessments.   Georgina Pillion, PharmD, BCPS Clinical Pharmacist Pager: [phone] 08/28/2012 9:46 PM

## 2012-08-28 NOTE — Op Note (Signed)
ICD system extraction performed without immediate complication. [id number].

## 2012-08-28 NOTE — Preoperative (Signed)
Beta Blockers   Reason not to administer Beta Blockers:Pt took Coreg on 08/28/12@0800 

## 2012-08-29 ENCOUNTER — Observation Stay (HOSPITAL_COMMUNITY): Payer: Medicare Other

## 2012-08-29 ENCOUNTER — Encounter (HOSPITAL_COMMUNITY): Payer: Self-pay | Admitting: Internal Medicine

## 2012-08-29 LAB — GLUCOSE, CAPILLARY: Glucose-Capillary: 153 mg/dL — ABNORMAL HIGH (ref 70–99)

## 2012-08-29 LAB — BASIC METABOLIC PANEL
BUN: 47 mg/dL — ABNORMAL HIGH (ref 6–23)
Chloride: 104 mEq/L (ref 96–112)
Glucose, Bld: 189 mg/dL — ABNORMAL HIGH (ref 70–99)
Potassium: 5.3 mEq/L — ABNORMAL HIGH (ref 3.5–5.1)

## 2012-08-29 LAB — PROTIME-INR
INR: 1.29 (ref 0.00–1.49)
Prothrombin Time: 15.8 s — ABNORMAL HIGH (ref 11.6–15.2)

## 2012-08-29 NOTE — Op Note (Addendum)
NAMEJARON, Joseph Hamilton NO.:  [id number]  MEDICAL RECORD NO.:  [id number]  LOCATION:  2001                         FACILITY:  [facility]  PHYSICIAN:  Doylene Canning. Ladona Ridgel, MD    DATE OF BIRTH:  29-Dec-1940  DATE OF PROCEDURE:  08/28/2012 DATE OF DISCHARGE:                              OPERATIVE REPORT   PROCEDURE PERFORMED:  ICD system extraction.  INDICATIONS:  The device is at end of life.  The patient desires not to have another device placed as his left ventricular function has normalized several years after his initial implant.  INTRODUCTION:  The patient is a very pleasant male with a history of ischemic cardiomyopathy and is status post ICD implantation in 2005.  He was subsequently found to have normalization of his left ventricular systolic function.  He is now referred for ICD extraction as his device has reached elective replacement, and he adamantly desires to have the device removed in total.  PROCEDURE:  After informed was obtained, the patient was taken to the operating room in the fasting state.  The anesthesia service was utilized to provide general anesthesia.  An art line was placed for invasive monitoring.  At this point, a 7-cm incision was carried out over the left pectoral region.  The electrocautery was utilized to dissect down to the ICD pocket.  The leads were disconnected from the defibrillator can. The ICD generator was removed in total. Dense fibrous adhesions around the atrial and ventricular lead were freed up with electrocautery.  At this point, a stylet was placed into the atrial lead as well as the ventricular lead.  The ventricular lead helix was retracted.  The atrial lead was a fixed fine line, Guidant lead and it could not be rotated, whatsoever.  At this point, the 11-French [facility] mechanical dissection sheath was advanced over the defibrillator lead and into the subclavian vein.  In the same way, the [employer] 11-French sheath was  advanced over the pacemaker lead and into the subclavian vein.  The Cook long LR 11-French mechanical sheath was then advanced over the defibrillator lead.  It was advanced into the innominate vein, but could not be advanced further secondary to dense fibrous binding sites.  The atrial lead was targeted in the same way and had the same so with the sheath being stuck in the innominate vein, unable to be advanced further.  At this point, a 13-French Cook LR [employer] mechanical dissection sheath was advanced over the defibrillator lead. This sheath was able to be advanced into the superior vena cava and under gentle traction, the defibrillator lead was removed in total without any hemodynamic consequence.  At this point, attention was then turned to the atrial lead.  The 11-French sheath was advanced into the subclavian vein and gentle traction carried out resulting in removal of the atrial lead in total.  There were no hemodynamic consequences.  At this point, the pocket was irrigated with antibiotic irrigation. Pressure was held to obtain hemostasis.  The pocket was additionally irrigated and the incision was closed with 2-0 and 3-0 Vicryl.  Benzoin and Steri-Strips painted on the skin.  A pressure dressing applied, and the  patient was returned to the PACU in satisfactory condition.  COMPLICATIONS:  There no immediate procedure complications.  RESULTS:  Successful extraction of a [employer] dual-chamber ICD system in total without any immediate procedure complication.     Doylene Canning. Ladona Ridgel, MD     GWT/MEDQ  D:  08/28/2012  T:  08/29/2012  Job:  [id number]  cc:   Duke Salvia, MD, [facility]

## 2012-08-29 NOTE — Progress Notes (Signed)
08/29/2012 0730  Pt called RN with c/o of "not feeling very well. Something is not right."  Pt noted to have HR of 130 ST on monitor.  Pt also c/o of chest pain 8/10 with BP of 146/91.  2L O2 via Cole administered, I SL nitro administered, and EKG obtained at 0705. Recheck at 0710 BP 135/80 HR 103 and chest pain 2/10. Pt states he is feeling better and that the chest pain is diminishing.  Pt explained that he must take his morning cardiac medications before he eats in the morning.  He states that this happened when he was in cardiac rehab and between he and the MD's they decided it was best for for him to take his meds first thing in the morning before food.  Coreg 25mg  PO given at 0715 with BP 125/78 HR 93.  Pt is pain free at this time.  Will continue to closely monitor. Collin Rengel, Avie Echevaria , RN

## 2012-08-29 NOTE — Progress Notes (Signed)
Patient ID: Joseph Hamilton, male   DOB: 1941/02/06, 71 y.o.   MRN: [id number] Subjective:  Doing well after ICD system extraction. No chest pain or sob.  Objective:  Vital Signs in the last 24 hours: Temp:  [96.8 F (36 C)-98.4 F (36.9 C)] 98.4 F (36.9 C) (12/05 0424) Pulse Rate:  [67-106] 93  (12/05 0710) Resp:  [12-20] 18  (12/05 0700) BP: (125-160)/(73-95) 125/78 mmHg (12/05 0710) SpO2:  [93 %-100 %] 100 % (12/05 0700) Arterial Line BP: (152-166)/(61-66) 152/61 mmHg (12/04 2045)  Intake/Output from previous day: 12/04 0701 - 12/05 0700 In: 1650 [I.V.:1650] Out: 375 [Urine:375] Intake/Output from this shift:    Physical Exam: Well appearing 71 yo man, NAD HEENT: Unremarkable Neck:  No JVD, no thyromegally Lungs:  Clear with no wheezes, ICD pocket without hematoma. HEART:  Regular rate rhythm, no murmurs, no rubs, no clicks Abd:  Flat, positive bowel sounds, no organomegally, no rebound, no guarding Ext:  2 plus pulses, no edema, no cyanosis, no clubbing Skin:  No rashes no nodules Neuro:  CN II through XII intact, motor grossly intact  Lab Results:  Basename 08/28/12 1228  WBC 7.5  HGB 13.1  PLT 156    Basename 08/29/12 0500 08/28/12 1228  NA 135 140  K 5.3* 5.7*  CL 104 107  CO2 22 21  GLUCOSE 189* 120*  BUN 47* 53*  CREATININE 1.96* 2.08*   No results found for this basename: TROPONINI:2,CK,MB:2 in the last 72 hours Hepatic Function Panel No results found for this basename: PROT,ALBUMIN,AST,ALT,ALKPHOS,BILITOT,BILIDIR,IBILI in the last 72 hours No results found for this basename: CHOL in the last 72 hours No results found for this basename: PROTIME in the last 72 hours  Imaging: X-ray Chest Pa And Lateral   08/29/2012  *RADIOLOGY REPORT*  Clinical Data: Post ICD extraction  CHEST - 2 VIEW  Comparison: 08/28/2012  Findings: Enlargement of cardiac silhouette post CABG with slight pulmonary vascular congestion. Tortuous aorta with atherosclerotic calcification.  Large hiatal hernia. Pulmonary vascularity normal. Minimal peribronchial thickening and accentuation of perihilar markings increased since previous exam. No segmental consolidation, pleural effusion or pneumothorax. Bones appear demineralized. Air fluid level identified at the upper left chest laterally within the pocket of the previously identified AICD generator.  IMPRESSION: Enlargement of cardiac silhouette post CABG and AICD removal. Slight pulmonary vascular congestion and question minimal perihilar edema.   Original Report Authenticated By: Ulyses Southward, M.D.    Dg Chest 2 View  08/28/2012  *RADIOLOGY REPORT*  Clinical Data: ICD infection.  CHEST - 2 VIEW  Comparison: 02/11/2012  Findings: AICD in place.  Chronic mild cardiomegaly.  Large hiatal hernia.  Pulmonary vascularity is normal and the lungs are clear.  No effusions.  Slight accentuation of the thoracic kyphosis, unchanged.  Prior CABG.  IMPRESSION: No acute abnormalities.   Original Report Authenticated By: Francene Boyers, M.D.     Cardiac Studies: Tele - nsr Assessment/Plan:  1. ICD system extraction 2. CAD with normal LV function 3. DM Rec: ok for discharge home. Continue all home meds. Followup with me in 4 weeks. Followup in wound clinic in 1-2 weeks.  LOS: 1 day    Neveah Bang,M.D. 08/29/2012, 1:52 PM

## 2012-08-29 NOTE — Progress Notes (Signed)
Pt educated and informed of D/C information. Pt aware of f/u appts and which medications to continue taking. Pt has no further questions or concerns and is prepared for D/C with daughter.

## 2012-08-29 NOTE — Addendum Note (Signed)
Addendum  created 08/29/12 1844 by Elisabeth Most, CRNA   Modules edited:Anesthesia LDA

## 2012-08-29 NOTE — Discharge Summary (Signed)
ELECTROPHYSIOLOGY DISCHARGE SUMMARY    Patient ID: Joseph Hamilton,  MRN: [id number], DOB/AGE: 1941-09-25 71 y.o.  Admit date: 08/28/2012 Discharge date: 08/29/2012  Primary Care Physician: Letitia Libra, Ala Dach, MD Primary Cardiologist: Gala Romney, MD  Primary Discharge Diagnosis:  1. Ischemic CM, LVEF now normalized; s/p ICD system extraction  Secondary Discharge Diagnoses:  1. CAD 2. HTN 3. Dyslipidemia 4. OSA 5. Chronic renal insufficiency 6. History of pulmonary embolism  Procedures This Admission:  1. Successful extraction of a [employer] dual chamber ICD system in total 08/28/2012 without any immediate complication.  History and Hospital Course:  Please see Dr. Lubertha Basque admission H&P for full details. Mr. Volkert is a 71 year old man with a history of an ischemic cardiomyopathy, status post MI and CABG previously. He underwent ICD implantation in 2005. Since then he has had additional interventions. Most recently, his ejection fraction has normalized. He has never used his defibrillator. He wanted it to be removed and was seen in consultation by Dr. [name]. Mr. Kaucher presented yesterday and underwent ICD system extraction. He tolerated this procedure well without any immediate complication. He remains hemodynamically stable and afebrile. His implant site is intact without significant bleeding or hematoma. He has been given discharge instructions including wound care and activity restrictions. He will follow-up in 10 days for wound check. There were no changes made to his medications. He has been seen, examined and deemed stable for discharge today by Dr. [name].  Discharge Vitals: Blood pressure 125/78, pulse 93, temperature 98.4 F (36.9 C), temperature source Oral, resp. rate 18, height 5' 9.5" (1.765 m), weight 213 lb (96.616 kg), SpO2 100.00%.   Labs: Lab Results  Component Value Date   WBC 7.5 08/28/2012   HGB 13.1 08/28/2012   HCT 38.3* 08/28/2012   MCV  92.1 08/28/2012   PLT 156 08/28/2012     Lab 08/29/12 0500  NA 135  K 5.3*  CL 104  CO2 22  BUN 47*  CREATININE 1.96*  CALCIUM 9.3  PROT --  BILITOT --  ALKPHOS --  ALT --  AST --  GLUCOSE 189*    Basename 08/29/12 0500  INR 1.29    Disposition:  The patient is being discharged in stable condition.  Follow-up:     Follow-up Information    Follow up with [facility] On 09/02/2012. (At 11:45 AM for Coumadin follow-up (INR check))    Contact information:   [address] [place] [address] [phone]       Follow up with [name], MD. On 10/03/2012. (At 9:15 AM)    Contact information:   [address] [place] [address] [phone]      Follow up with [facility] On 09/09/2012. (At 10:00 AM for wound check)    Contact information:   [address] [place] [address] [phone]        Discharge Medications:    Medication List     As of 08/29/2012  3:48 PM    TAKE these medications         amitriptyline 25 MG tablet   Commonly known as: ELAVIL   Take 25 mg by mouth at bedtime.      aspirin 81 MG EC tablet   Take 81 mg by mouth daily.      carvedilol 25 MG tablet   Commonly known as: COREG   25mg  AM and 12.5mg  PM  furosemide 40 MG tablet   Commonly known as: LASIX   Take 40 mg by mouth daily.      isosorbide mononitrate 60 MG 24 hr tablet   Commonly known as: IMDUR   Take 1 tablet (60 mg total) by mouth daily.      [name] 3 MG tablet   Generic drug: warfarin   Take 1 tablet (3 mg total) by mouth daily.      nateglinide 60 MG tablet   Commonly known as: STARLIX   Take 60 mg by mouth 2 (two) times daily with a meal. Take with the two largest meals of the day      nitroGLYCERIN 0.4 MG SL tablet   Commonly known as: NITROSTAT   Place 1 tablet (0.4 mg total) under the tongue every 5 (five) minutes x 3 doses as needed for chest pain.      omeprazole  40 MG capsule   Commonly known as: PRILOSEC   Take 1 capsule (40 mg total) by mouth daily.      ramipril 10 MG capsule   Commonly known as: ALTACE   Take 1 capsule (10 mg total) by mouth daily.      rosuvastatin 40 MG tablet   Commonly known as: CRESTOR   Take 40 mg by mouth daily.      spironolactone 25 MG tablet   Commonly known as: ALDACTONE   Take 1 tablet (25 mg total) by mouth daily.      zolpidem 12.5 MG CR tablet   Commonly known as: AMBIEN CR   TAKE ONE TABLET DAILY AT BEDTIME       Duration of Discharge Encounter: Greater than 30 minutes including physician time.  Signed, Rick Duff, PA-C 08/29/2012, 3:48 PM

## 2012-09-02 ENCOUNTER — Ambulatory Visit (INDEPENDENT_AMBULATORY_CARE_PROVIDER_SITE_OTHER): Payer: Medicare Other | Admitting: *Deleted

## 2012-09-02 DIAGNOSIS — Z7901 Long term (current) use of anticoagulants: Secondary | ICD-10-CM

## 2012-09-02 DIAGNOSIS — I2699 Other pulmonary embolism without acute cor pulmonale: Secondary | ICD-10-CM

## 2012-09-05 ENCOUNTER — Telehealth: Payer: Self-pay | Admitting: Internal Medicine

## 2012-09-05 MED ORDER — FUROSEMIDE 40 MG PO TABS: 40.0000 mg | ORAL_TABLET | Freq: Every day | ORAL | Status: DC

## 2012-09-05 MED ORDER — ROSUVASTATIN CALCIUM 40 MG PO TABS: 40.0000 mg | ORAL_TABLET | Freq: Every day | ORAL | Status: DC

## 2012-09-05 NOTE — Telephone Encounter (Signed)
Refills sent

## 2012-09-05 NOTE — Telephone Encounter (Signed)
REFILL CRESTOR 40 MG TAB QTY 30 TAKE 1 TABLET EACH DAY QTY 30 LAST FILL 07-27-2012  FUROSEMIDE 40 MG TAB QTY 30 TAKE 1 TABLET EACH DAY LAST FILL 07-27-12

## 2012-09-06 ENCOUNTER — Telehealth: Payer: Self-pay | Admitting: Internal Medicine

## 2012-09-06 ENCOUNTER — Encounter: Payer: Self-pay | Admitting: Internal Medicine

## 2012-09-06 ENCOUNTER — Ambulatory Visit (INDEPENDENT_AMBULATORY_CARE_PROVIDER_SITE_OTHER): Payer: Medicare Other | Admitting: *Deleted

## 2012-09-06 ENCOUNTER — Ambulatory Visit (INDEPENDENT_AMBULATORY_CARE_PROVIDER_SITE_OTHER): Payer: Medicare Other | Admitting: Internal Medicine

## 2012-09-06 VITALS — BP 99/63 | HR 67 | Ht 69.5 in | Wt 229.8 lb

## 2012-09-06 DIAGNOSIS — Z7901 Long term (current) use of anticoagulants: Secondary | ICD-10-CM

## 2012-09-06 DIAGNOSIS — I2699 Other pulmonary embolism without acute cor pulmonale: Secondary | ICD-10-CM

## 2012-09-06 DIAGNOSIS — I5022 Chronic systolic (congestive) heart failure: Secondary | ICD-10-CM

## 2012-09-06 DIAGNOSIS — Z95810 Presence of automatic (implantable) cardiac defibrillator: Secondary | ICD-10-CM

## 2012-09-06 NOTE — Assessment & Plan Note (Signed)
His left ventricular function has normalized and his heart failure symptoms are class I. No change in medical therapy from a heart failure perspective.

## 2012-09-06 NOTE — Telephone Encounter (Signed)
plz return call to pt [phone] regarding ICD removal 08/28/12, pt experiencing some swelling in the procedure area.

## 2012-09-06 NOTE — Assessment & Plan Note (Signed)
His ICD is been extracted in total. He has a residual ICD pocket hematoma. I've recommended that the patient stop warfarin and aspirin. He will restart his warfarin in one week. We will hold aspirin for longer. His INR today was 2.1.

## 2012-09-06 NOTE — Progress Notes (Signed)
HPI Mr. Schirmer returns today for followup. He is a very pleasant 71 year old man with a history of an ischemic cardiomyopathy, chronic systolic heart failure, status post ICD implantation. The patient has had normalization of his left ventricular function. He has reached device at elective replacement. He desired to have the device removed. His ICD was removed in total approximately 2 weeks ago. Approximately 5 days ago, he noted swelling over his ICD insertion site. No drainage. No fevers no chills. No syncope. Allergies  Allergen Reactions  . Ezetimibe-Simvastatin     REACTION: myalgia: TOLERATES PRAVOCHOL OK     Current Outpatient Prescriptions  Medication Sig Dispense Refill  . amitriptyline (ELAVIL) 25 MG tablet Take 25 mg by mouth at bedtime.      . carvedilol (COREG) 25 MG tablet 25mg  AM and 12.5mg  PM      . furosemide (LASIX) 40 MG tablet Take 1 tablet (40 mg total) by mouth daily.  30 tablet  2  . isosorbide mononitrate (IMDUR) 60 MG 24 hr tablet Take 1 tablet (60 mg total) by mouth daily.  30 tablet  6  . [name] 3 MG tablet Take 1 tablet (3 mg total) by mouth daily.  30 tablet  3  . nateglinide (STARLIX) 60 MG tablet Take 60 mg by mouth 2 (two) times daily with a meal. Take with the two largest meals of the day      . nitroGLYCERIN (NITROSTAT) 0.4 MG SL tablet Place 1 tablet (0.4 mg total) under the tongue every 5 (five) minutes x 3 doses as needed for chest pain.  25 tablet  3  . omeprazole (PRILOSEC) 40 MG capsule Take 1 capsule (40 mg total) by mouth daily.  30 capsule  11  . ramipril (ALTACE) 10 MG capsule Take 1 capsule (10 mg total) by mouth daily.  30 capsule  5  . rosuvastatin (CRESTOR) 40 MG tablet Take 1 tablet (40 mg total) by mouth daily.  30 tablet  2  . zolpidem (AMBIEN CR) 12.5 MG CR tablet TAKE ONE TABLET DAILY AT BEDTIME  30 tablet  2     Past Medical History  Diagnosis Date  . Coronary artery disease     s/p MI X 2, REDO CABG 2003, MYOVIEW 12/09 EF 28% ,  EXTENSIVE ANTERIOR, INFERIOR AND LATERAL INFARCT. VERY MILD ANTERIOR ISCHEMIA  . CHF (congestive heart failure)     SECONDARY TO ISCHEMIC CARDIOMYOPATHY ; EF 28% BY MYOVIEW ; 50% BY ECHO 07/2008; ECHO 09/2009 EF "MILD TO MODERATELY REDUCED"  . Hypertension   . Hyperlipidemia   . Dizziness     CHRONIC; s/p EXTENSIVE EVAL, s/p EVAL @ [facility]; HEADACHE:  s/p EXTENSIVE EVAL; ON ELAVIL  . Sleep apnea 03/31/10    UNABLE TO TOLERATE CPAP; > HOME SLEEP TEST AHI 15.6  . Obesity   . Dupuytren contracture   . Nephrolithiasis   . CRI (chronic renal insufficiency)     BASELINE Cr. 1.7 ----DR. MATTINGLY  . Carotid artery occlusion     0-39% BILATERALLY W/CHRONICALLY OCCLUDED L VERTEBRAL  . Angina   . ICD (implantable cardiac defibrillator) in place   . Pacemaker   . PE (pulmonary embolism) 2005  . Myocardial infarction     x3  . Diabetes mellitus without complication   . GERD (gastroesophageal reflux disease)   . Arthritis     ROS:   All systems reviewed and negative except as noted in the HPI.   Past Surgical History  Procedure Date  .  Insert / replace / remove pacemaker 2006    s/p GUIDANT IMPLANTABLE CARDIVERTER DEFIBRILLATOR  . Cataract extraction   . Tonsillectomy     AS A CHILD  . Kidney stone surgery   . Coronary artery bypass graft 1995     AND 2003  . Eye surgery   . Pacemaker lead removal 08/28/2012    Procedure: PACEMAKER LEAD REMOVAL;  Surgeon: Marinus Maw, MD;  Location: [facility] OR;  Service: Cardiovascular;  Laterality: N/A;     No family history on file.   History   Social History  . Marital Status: Divorced    Spouse Name: N/A    Number of Children: N/A  . Years of Education: N/A   Occupational History  . RETIRED     WORKS PART TIME   Social History Main Topics  . Smoking status: Former Smoker    Types: Pipe    Quit date: 09/25/1993  . Smokeless tobacco: Never Used  . Alcohol Use: 3.5 oz/week    7 drink(s) per week     Comment: 1 DRINK PER DAY  .  Drug Use: No  . Sexually Active: Not on file   Other Topics Concern  . Not on file   Social History Narrative   DIVORCEDLIVES BY HIMSELF3 [place] 1995ETOH 1 A DAYNO EXERCISEICD-[employer].Marland KitchenMarland KitchenREMOTE YES     BP 99/63  Pulse 67  Ht 5' 9.5" (1.765 m)  Wt 229 lb 12.8 oz (104.237 kg)  BMI 33.45 kg/m2  SpO2 97%  Physical Exam:  Well appearing 71 year old man, NAD HEENT: Unremarkable Neck:  No JVD, no thyromegally Lungs:  Clear with no wheezes, rales, or rhonchi. Well-healed ICD incision with bandages in place. There is a small hematoma. No ecchymoses. No obvious drainage. HEART:  Regular rate rhythm, no murmurs, no rubs, no clicks Abd:  soft, positive bowel sounds, no organomegally, no rebound, no guarding Ext:  2 plus pulses, no edema, no cyanosis, no clubbing Skin:  No rashes no nodules Neuro:  CN II through XII intact, motor grossly intact   Assess/Plan:

## 2012-09-06 NOTE — Telephone Encounter (Signed)
Pt calls today b/c of swelling below the ICD incision remova site. Denies any shortness of breath, or increased pain, no drainage, and no bruising. "feels like spongy rubber below my incision site".  States it has been growing all week. Dr. Ladona Ridgel reviewed and will see pt today along with Coumadin check. Pt aware of appts today and will arrive early. Mylo Red RN

## 2012-09-06 NOTE — Patient Instructions (Addendum)
Your physician recommends that you schedule a follow-up appointment in: 2 weeks with the device clinic on a day that Dr Ladona Ridgel is in the office  Your physician has recommended you make the following change in your medication: STOP Spironoactone and Aspirin

## 2012-09-09 ENCOUNTER — Ambulatory Visit: Payer: Medicare Other

## 2012-09-12 ENCOUNTER — Ambulatory Visit (INDEPENDENT_AMBULATORY_CARE_PROVIDER_SITE_OTHER): Payer: Medicare Other | Admitting: *Deleted

## 2012-09-12 DIAGNOSIS — I428 Other cardiomyopathies: Secondary | ICD-10-CM

## 2012-09-17 LAB — CBC WITH DIFFERENTIAL/PLATELET
Basophils Absolute: 0 10*3/uL (ref 0.0–0.1)
Basophils Relative: 1 % (ref 0–1)
Eosinophils Absolute: 0.2 10*3/uL (ref 0.0–0.7)
Eosinophils Relative: 4 % (ref 0–5)
MCH: 31.9 pg (ref 26.0–34.0)
MCHC: 35.6 g/dL (ref 30.0–36.0)
MCV: 89.5 fL (ref 78.0–100.0)
Platelets: 172 10*3/uL (ref 150–400)
RDW: 14.4 % (ref 11.5–15.5)

## 2012-09-17 LAB — BASIC METABOLIC PANEL
CO2: 23 mEq/L (ref 19–32)
Calcium: 9.2 mg/dL (ref 8.4–10.5)
Creat: 2.03 mg/dL — ABNORMAL HIGH (ref 0.50–1.35)

## 2012-09-17 LAB — HEPATIC FUNCTION PANEL
AST: 15 U/L (ref 0–37)
Albumin: 4.1 g/dL (ref 3.5–5.2)
Alkaline Phosphatase: 40 U/L (ref 39–117)
Total Bilirubin: 0.5 mg/dL (ref 0.3–1.2)

## 2012-09-17 LAB — LIPID PANEL: HDL: 32 mg/dL — ABNORMAL LOW (ref >39)

## 2012-09-17 NOTE — Telephone Encounter (Signed)
Lab orders released/SLS 

## 2012-09-17 NOTE — Addendum Note (Signed)
Addended by: Regis Bill on: 09/17/2012 11:01 AM   Modules accepted: Orders

## 2012-09-20 ENCOUNTER — Ambulatory Visit (INDEPENDENT_AMBULATORY_CARE_PROVIDER_SITE_OTHER): Payer: Medicare Other | Admitting: *Deleted

## 2012-09-20 ENCOUNTER — Ambulatory Visit: Payer: Medicare Other | Admitting: Internal Medicine

## 2012-09-20 DIAGNOSIS — I2699 Other pulmonary embolism without acute cor pulmonale: Secondary | ICD-10-CM

## 2012-09-20 DIAGNOSIS — Z7901 Long term (current) use of anticoagulants: Secondary | ICD-10-CM

## 2012-09-23 ENCOUNTER — Ambulatory Visit (INDEPENDENT_AMBULATORY_CARE_PROVIDER_SITE_OTHER): Payer: Medicare Other | Admitting: Internal Medicine

## 2012-09-23 ENCOUNTER — Encounter: Payer: Self-pay | Admitting: Internal Medicine

## 2012-09-23 VITALS — BP 94/62 | HR 63 | Temp 97.6°F | Resp 16 | Wt 218.0 lb

## 2012-09-23 DIAGNOSIS — E785 Hyperlipidemia, unspecified: Secondary | ICD-10-CM

## 2012-09-23 DIAGNOSIS — E119 Type 2 diabetes mellitus without complications: Secondary | ICD-10-CM

## 2012-09-23 NOTE — Assessment & Plan Note (Signed)
Good control. Continue current regimen. Not interested in home fsbs monitoring.

## 2012-09-23 NOTE — Assessment & Plan Note (Signed)
LDL at goal. Continue statin tx. Understands hdl may improve with continued exercise.

## 2012-09-23 NOTE — Patient Instructions (Signed)
Please schedule labs prior to next visit Chem7, a1c, urine microalbumin-250.00

## 2012-09-23 NOTE — Progress Notes (Signed)
  Subjective:    Patient ID: Joseph Hamilton, male    DOB: Jan 21, 1941, 71 y.o.   MRN: [id number]  HPI Pt presents to clinic for followup of multiple medical problems. S/p icd removal since last visit and developed hematoma. Thinks hematoma may be slightly smaller. Reviewed labs with pt and copy provided. Declines flu vaccine, zostavax and pneumovax.  Past Medical History  Diagnosis Date  . Coronary artery disease     s/p MI X 2, REDO CABG 2003, MYOVIEW 12/09 EF 28% , EXTENSIVE ANTERIOR, INFERIOR AND LATERAL INFARCT. VERY MILD ANTERIOR ISCHEMIA  . CHF (congestive heart failure)     SECONDARY TO ISCHEMIC CARDIOMYOPATHY ; EF 28% BY MYOVIEW ; 50% BY ECHO 07/2008; ECHO 09/2009 EF "MILD TO MODERATELY REDUCED"  . Hypertension   . Hyperlipidemia   . Dizziness     CHRONIC; s/p EXTENSIVE EVAL, s/p EVAL @ [facility]; HEADACHE:  s/p EXTENSIVE EVAL; ON ELAVIL  . Sleep apnea 03/31/10    UNABLE TO TOLERATE CPAP; > HOME SLEEP TEST AHI 15.6  . Obesity   . Dupuytren contracture   . Nephrolithiasis   . CRI (chronic renal insufficiency)     BASELINE Cr. 1.7 ----DR. MATTINGLY  . Carotid artery occlusion     0-39% BILATERALLY W/CHRONICALLY OCCLUDED L VERTEBRAL  . Angina   . ICD (implantable cardiac defibrillator) in place   . Pacemaker   . PE (pulmonary embolism) 2005  . Myocardial infarction     x3  . Diabetes mellitus without complication   . GERD (gastroesophageal reflux disease)   . Arthritis    Past Surgical History  Procedure Date  . Insert / replace / remove pacemaker 2006    s/p GUIDANT IMPLANTABLE CARDIVERTER DEFIBRILLATOR  . Cataract extraction   . Tonsillectomy     AS A CHILD  . Kidney stone surgery   . Coronary artery bypass graft 1995     AND 2003  . Eye surgery   . Pacemaker lead removal 08/28/2012    Procedure: PACEMAKER LEAD REMOVAL;  Surgeon: Marinus Maw, MD;  Location: [facility] OR;  Service: Cardiovascular;  Laterality: N/A;    reports that he quit smoking about 19 years ago. His  smoking use included Pipe. He has never used smokeless tobacco. He reports that he drinks about 3.5 ounces of alcohol per week. He reports that he does not use illicit drugs. family history is not on file. Allergies  Allergen Reactions  . Ezetimibe-Simvastatin     REACTION: myalgia: TOLERATES PRAVOCHOL OK      Review of Systems see hpi     Objective:   Physical Exam  Physical Exam  Nursing note and vitals reviewed. Constitutional: Appears well-developed and well-nourished. No distress.  HENT:  Head: Normocephalic and atraumatic.  Right Ear: External ear normal.  Left Ear: External ear normal.  Eyes: Conjunctivae are normal. No scleral icterus.  Neck: Neck supple. Carotid bruit is not present.  Cardiovascular: Normal rate, regular rhythm and normal heart sounds.  Exam reveals no gallop and no friction rub.   No murmur heard. Pulmonary/Chest: Effort normal and breath sounds normal. No respiratory distress. He has no wheezes. no rales.  Lymphadenopathy:    He has no cervical adenopathy.  Neurological:Alert.  Skin: Skin is warm and dry. Not diaphoretic.  Psychiatric: Has a normal mood and affect.        Assessment & Plan:

## 2012-09-27 ENCOUNTER — Encounter: Payer: Self-pay | Admitting: Internal Medicine

## 2012-10-03 ENCOUNTER — Encounter: Payer: Self-pay | Admitting: Internal Medicine

## 2012-10-03 ENCOUNTER — Ambulatory Visit (INDEPENDENT_AMBULATORY_CARE_PROVIDER_SITE_OTHER): Payer: Medicare Other | Admitting: Internal Medicine

## 2012-10-03 ENCOUNTER — Ambulatory Visit (INDEPENDENT_AMBULATORY_CARE_PROVIDER_SITE_OTHER): Payer: Medicare Other | Admitting: Pharmacist

## 2012-10-03 VITALS — BP 99/58 | HR 80 | Ht 69.0 in | Wt 220.0 lb

## 2012-10-03 DIAGNOSIS — I5022 Chronic systolic (congestive) heart failure: Secondary | ICD-10-CM

## 2012-10-03 DIAGNOSIS — Z7901 Long term (current) use of anticoagulants: Secondary | ICD-10-CM

## 2012-10-03 DIAGNOSIS — I2510 Atherosclerotic heart disease of native coronary artery without angina pectoris: Secondary | ICD-10-CM

## 2012-10-03 DIAGNOSIS — I10 Essential (primary) hypertension: Secondary | ICD-10-CM

## 2012-10-03 DIAGNOSIS — Z95810 Presence of automatic (implantable) cardiac defibrillator: Secondary | ICD-10-CM

## 2012-10-03 DIAGNOSIS — I2699 Other pulmonary embolism without acute cor pulmonale: Secondary | ICD-10-CM

## 2012-10-03 LAB — POCT INR: INR: 2

## 2012-10-03 MED ORDER — ASPIRIN EC 81 MG PO TBEC: 81.0000 mg | DELAYED_RELEASE_TABLET | Freq: Every day | ORAL | Status: DC

## 2012-10-03 NOTE — Assessment & Plan Note (Signed)
He is stable status post device extraction.

## 2012-10-03 NOTE — Assessment & Plan Note (Signed)
He denies anginal symptoms. He will continue his current medical therapy. I've encouraged the patient to continue his exercise regimen.

## 2012-10-03 NOTE — Progress Notes (Signed)
HPI Mr. Wease for followup. He is a 72 year old man with a prior ischemic cardiomyopathy, status post ICD implantation. He had reached elective replacement, and elected to have his device extracted as his left ventricular function had normalized and he never had any ICD therapies. His extraction was carried out approximately 6 weeks ago without complication. I initially asked the patient to hold his aspirin therapy. He denies chest pain or shortness of breath. He is exercising but not lifting any heavy weight. Allergies  Allergen Reactions  . Ezetimibe-Simvastatin     REACTION: myalgia: TOLERATES PRAVOCHOL OK     Current Outpatient Prescriptions  Medication Sig Dispense Refill  . amitriptyline (ELAVIL) 25 MG tablet Take 25 mg by mouth at bedtime.      . carvedilol (COREG) 25 MG tablet 25mg  AM and 12.5mg  PM      . furosemide (LASIX) 40 MG tablet Take 1 tablet (40 mg total) by mouth daily.  30 tablet  2  . isosorbide mononitrate (IMDUR) 60 MG 24 hr tablet Take 1 tablet (60 mg total) by mouth daily.  30 tablet  6  . [name] 3 MG tablet Take 1 tablet (3 mg total) by mouth daily.  30 tablet  3  . nateglinide (STARLIX) 60 MG tablet Take 60 mg by mouth 2 (two) times daily with a meal. Take with the two largest meals of the day      . nitroGLYCERIN (NITROSTAT) 0.4 MG SL tablet Place 1 tablet (0.4 mg total) under the tongue every 5 (five) minutes x 3 doses as needed for chest pain.  25 tablet  3  . omeprazole (PRILOSEC) 40 MG capsule Take 1 capsule (40 mg total) by mouth daily.  30 capsule  11  . ramipril (ALTACE) 10 MG capsule Take 1 capsule (10 mg total) by mouth daily.  30 capsule  5  . rosuvastatin (CRESTOR) 40 MG tablet Take 1 tablet (40 mg total) by mouth daily.  30 tablet  2  . zolpidem (AMBIEN CR) 12.5 MG CR tablet TAKE ONE TABLET DAILY AT BEDTIME  30 tablet  2     Past Medical History  Diagnosis Date  . Coronary artery disease     s/p MI X 2, REDO CABG 2003, MYOVIEW 12/09 EF 28% ,  EXTENSIVE ANTERIOR, INFERIOR AND LATERAL INFARCT. VERY MILD ANTERIOR ISCHEMIA  . CHF (congestive heart failure)     SECONDARY TO ISCHEMIC CARDIOMYOPATHY ; EF 28% BY MYOVIEW ; 50% BY ECHO 07/2008; ECHO 09/2009 EF "MILD TO MODERATELY REDUCED"  . Hypertension   . Hyperlipidemia   . Dizziness     CHRONIC; s/p EXTENSIVE EVAL, s/p EVAL @ [facility]; HEADACHE:  s/p EXTENSIVE EVAL; ON ELAVIL  . Sleep apnea 03/31/10    UNABLE TO TOLERATE CPAP; > HOME SLEEP TEST AHI 15.6  . Obesity   . Dupuytren contracture   . Nephrolithiasis   . CRI (chronic renal insufficiency)     BASELINE Cr. 1.7 ----DR. MATTINGLY  . Carotid artery occlusion     0-39% BILATERALLY W/CHRONICALLY OCCLUDED L VERTEBRAL  . Angina   . ICD (implantable cardiac defibrillator) in place   . Pacemaker   . PE (pulmonary embolism) 2005  . Myocardial infarction     x3  . Diabetes mellitus without complication   . GERD (gastroesophageal reflux disease)   . Arthritis     ROS:   All systems reviewed and negative except as noted in the HPI.   Past Surgical History  Procedure Date  .  Insert / replace / remove pacemaker 2006    s/p GUIDANT IMPLANTABLE CARDIVERTER DEFIBRILLATOR  . Cataract extraction   . Tonsillectomy     AS A CHILD  . Kidney stone surgery   . Coronary artery bypass graft 1995     AND 2003  . Eye surgery   . Pacemaker lead removal 08/28/2012    Procedure: PACEMAKER LEAD REMOVAL;  Surgeon: Marinus Maw, MD;  Location: [facility] OR;  Service: Cardiovascular;  Laterality: N/A;     No family history on file.   History   Social History  . Marital Status: Divorced    Spouse Name: N/A    Number of Children: N/A  . Years of Education: N/A   Occupational History  . RETIRED     WORKS PART TIME   Social History Main Topics  . Smoking status: Former Smoker    Types: Pipe    Quit date: 09/25/1993  . Smokeless tobacco: Never Used  . Alcohol Use: 3.5 oz/week    7 drink(s) per week     Comment: 1 DRINK PER DAY  .  Drug Use: No  . Sexually Active: Not on file   Other Topics Concern  . Not on file   Social History Narrative   DIVORCEDLIVES BY HIMSELF3 [place]ILDR[place] 1995ETOH 1 A DAYNO EXERCISEICD-[employer].Marland KitchenMarland KitchenREMOTE YES     BP 99/58  Pulse 80  Ht 5\' 9"  (1.753 m)  Wt 220 lb (99.791 kg)  BMI 32.49 kg/m2  Physical Exam:  Well appearing 72 year old man, NAD HEENT: Unremarkable Neck:  No JVD, no thyromegally Lungs:  Clear with no wheezes, rales, or rhonchi. Minimal residual hematoma under the well-healed ICD removal incision. HEART:  Regular rate rhythm, no murmurs, no rubs, no clicks Abd:  soft, positive bowel sounds, no organomegally, no rebound, no guarding Ext:  2 plus pulses, no edema, no cyanosis, no clubbing Skin:  No rashes no nodules Neuro:  CN II through XII intact, motor grossly intact   Assess/Plan:

## 2012-10-03 NOTE — Assessment & Plan Note (Signed)
He has had normalization of his left ventricular function. His symptoms are class I. He'll continue his current medical therapy.

## 2012-10-03 NOTE — Patient Instructions (Addendum)
Your physician wants you to follow-up in: 6 months with Dr Gala Romney.  You will receive a reminder letter in the mail two months in advance. If you don't receive a letter, please call our office to schedule the follow-up appointment.  Resume Aspirin 81mg  1 tablet daily.

## 2012-10-14 ENCOUNTER — Telehealth: Payer: Self-pay | Admitting: Internal Medicine

## 2012-10-14 MED ORDER — AMITRIPTYLINE HCL 25 MG PO TABS: 25.0000 mg | ORAL_TABLET | Freq: Every day | ORAL | Status: DC

## 2012-10-14 NOTE — Telephone Encounter (Signed)
So we need to clarify rx, if he is taking Amitriptyline 25 mg qhs and was given 90 a month ago he should still have 2 months worse. Is he taking more than that and is just was not documented in meds?

## 2012-10-14 NOTE — Telephone Encounter (Signed)
Dr Rodena Medin hasn't filled this. Per MD ok to fill

## 2012-10-14 NOTE — Telephone Encounter (Signed)
Refill- amitriptyline hcl 25 mg tab. Take one tablet daily at bedtime. Qty 90 last fill 12.17.13

## 2012-10-29 ENCOUNTER — Ambulatory Visit (INDEPENDENT_AMBULATORY_CARE_PROVIDER_SITE_OTHER): Payer: Medicare Other | Admitting: *Deleted

## 2012-10-29 DIAGNOSIS — I2699 Other pulmonary embolism without acute cor pulmonale: Secondary | ICD-10-CM

## 2012-10-29 DIAGNOSIS — Z7901 Long term (current) use of anticoagulants: Secondary | ICD-10-CM

## 2012-10-29 LAB — POCT INR: INR: 1.9

## 2012-11-18 ENCOUNTER — Telehealth: Payer: Self-pay | Admitting: Internal Medicine

## 2012-11-18 NOTE — Telephone Encounter (Signed)
Script filled previously

## 2012-11-18 NOTE — Telephone Encounter (Signed)
Refill jantoven 3mg  tab  Qty 30 take 1 tablet by mouth every day

## 2012-11-22 ENCOUNTER — Other Ambulatory Visit: Payer: Self-pay

## 2012-11-25 MED ORDER — RAMIPRIL 10 MG PO CAPS: 10.0000 mg | ORAL_CAPSULE | Freq: Every day | ORAL | Status: DC

## 2012-11-26 ENCOUNTER — Telehealth: Payer: Self-pay

## 2012-11-26 ENCOUNTER — Other Ambulatory Visit: Payer: Self-pay | Admitting: Internal Medicine

## 2012-11-26 ENCOUNTER — Ambulatory Visit (INDEPENDENT_AMBULATORY_CARE_PROVIDER_SITE_OTHER): Payer: Medicare Other | Admitting: Pharmacist

## 2012-11-26 DIAGNOSIS — Z7901 Long term (current) use of anticoagulants: Secondary | ICD-10-CM

## 2012-11-26 DIAGNOSIS — I2699 Other pulmonary embolism without acute cor pulmonale: Secondary | ICD-10-CM

## 2012-11-26 MED ORDER — ZOLPIDEM TARTRATE ER 12.5 MG PO TBCR: 12.5000 mg | EXTENDED_RELEASE_TABLET | Freq: Every evening | ORAL | Status: DC | PRN

## 2012-11-26 NOTE — Telephone Encounter (Signed)
Please advise refill. Last RX was wrote on 07-27-12 quantity 30 with 2 refills.  If ok send to

## 2012-11-26 NOTE — Telephone Encounter (Signed)
OK to refill Ambien,same strength, same #, same rf

## 2012-11-26 NOTE — Telephone Encounter (Signed)
If ok send to [phone]

## 2012-11-26 NOTE — Telephone Encounter (Signed)
RX sent

## 2012-11-26 NOTE — Addendum Note (Signed)
Addended by: Court Joy on: 11/26/2012 04:58 PM   Modules accepted: Orders

## 2012-11-26 NOTE — Telephone Encounter (Signed)
Insurance is requiring a prior authorization for his zolpidem tartrate12.5mg  tabs  [facility]

## 2012-11-26 NOTE — Telephone Encounter (Signed)
Patient is out of meds.  [facility] was to let us know last month that he has no more refills.

## 2012-11-27 ENCOUNTER — Telehealth: Payer: Self-pay | Admitting: Internal Medicine

## 2012-11-27 MED ORDER — NATEGLINIDE 60 MG PO TABS: 60.0000 mg | ORAL_TABLET | Freq: Two times a day (BID) | ORAL | Status: DC

## 2012-11-27 NOTE — Telephone Encounter (Signed)
Refill- nateglinide 60mg  tab. Take one tablet twice daily a few minutes before the 2 largest meals. Qty 60 last fill 10.17.13

## 2012-11-28 ENCOUNTER — Telehealth: Payer: Self-pay | Admitting: Internal Medicine

## 2012-11-28 NOTE — Telephone Encounter (Signed)
[facility] approved zolpidem tartrate 12.5 mg tablet qty 30.  It is approved from 10-29-2012 to March 6,2017  Case # [id number]

## 2012-12-05 MED ORDER — ZOLPIDEM TARTRATE ER 12.5 MG PO TBCR: 12.5000 mg | EXTENDED_RELEASE_TABLET | Freq: Every evening | ORAL | Status: DC | PRN

## 2012-12-05 NOTE — Addendum Note (Signed)
Addended by: Court Joy on: 12/05/2012 12:24 PM   Modules accepted: Orders

## 2012-12-16 ENCOUNTER — Telehealth: Payer: Self-pay | Admitting: Internal Medicine

## 2012-12-16 NOTE — Telephone Encounter (Signed)
jantoven 3 mg tab take 1 tablet by mouth every day last fill 01/30/1941

## 2012-12-16 NOTE — Telephone Encounter (Signed)
Please advise refill of [name]?

## 2012-12-17 ENCOUNTER — Telehealth: Payer: Self-pay

## 2012-12-17 NOTE — Telephone Encounter (Signed)
Please advise [name] refill? Looks like Dr Ladona Ridgel RX'd? Pt states he is not seeing Dr Ladona Ridgel anymore? Pt also sees the coumadin clinic every month? Should they be refilling [name]? Please advise?

## 2012-12-17 NOTE — Telephone Encounter (Signed)
So I am OK to give him a 30 day supply of his Coumadin for now while we sort this out. So has he switched cardiologist or does he just not have one, with his heart history and an ICD in place he should have a cardiologist. I could set him up with a different cardiologist in our group if he does not have one.

## 2012-12-18 MED ORDER — JANTOVEN 3 MG PO TABS: 3.0000 mg | ORAL_TABLET | Freq: Every day | ORAL | Status: DC

## 2012-12-18 NOTE — Telephone Encounter (Signed)
Dr Unice Bailey is the cardiologist. The patient will have him follow it after this month supply

## 2012-12-24 ENCOUNTER — Ambulatory Visit (INDEPENDENT_AMBULATORY_CARE_PROVIDER_SITE_OTHER): Payer: Medicare Other | Admitting: *Deleted

## 2012-12-24 ENCOUNTER — Telehealth: Payer: Self-pay | Admitting: Internal Medicine

## 2012-12-24 DIAGNOSIS — I2699 Other pulmonary embolism without acute cor pulmonale: Secondary | ICD-10-CM

## 2012-12-24 DIAGNOSIS — Z7901 Long term (current) use of anticoagulants: Secondary | ICD-10-CM

## 2012-12-24 LAB — POCT INR: INR: 2.6

## 2012-12-24 MED ORDER — FUROSEMIDE 40 MG PO TABS: 40.0000 mg | ORAL_TABLET | Freq: Every day | ORAL | Status: DC

## 2012-12-24 MED ORDER — JANTOVEN 3 MG PO TABS: 3.0000 mg | ORAL_TABLET | ORAL | Status: DC

## 2012-12-24 NOTE — Telephone Encounter (Signed)
Rx request to pharmacy/SLS  

## 2012-12-24 NOTE — Telephone Encounter (Signed)
Refill- furosemide 40mg  tab. Take one tablet each day. Qty 30 last fill 3.31.14

## 2013-01-02 ENCOUNTER — Other Ambulatory Visit: Payer: Self-pay | Admitting: Internal Medicine

## 2013-01-02 NOTE — Telephone Encounter (Signed)
Rx request to pharmacy/SLS Pt due for f/u OV, [former Dr. Rodena Medin pt], not seen since PCP out of office; scheduled OV Thurs, 04.17.14 @ 1:45pm/SLS

## 2013-01-09 ENCOUNTER — Ambulatory Visit (HOSPITAL_COMMUNITY)
Admission: RE | Admit: 2013-01-09 | Discharge: 2013-01-09 | Disposition: A | Discharge status: Home / Self Care | Payer: Medicare Other | Source: Ambulatory Visit | Attending: Internal Medicine | Admitting: Internal Medicine

## 2013-01-09 ENCOUNTER — Encounter (HOSPITAL_COMMUNITY): Payer: Self-pay

## 2013-01-09 ENCOUNTER — Ambulatory Visit: Payer: Medicare Other | Admitting: Family Medicine

## 2013-01-09 VITALS — BP 110/68 | HR 77 | Wt 220.8 lb

## 2013-01-09 DIAGNOSIS — Z86711 Personal history of pulmonary embolism: Secondary | ICD-10-CM | POA: Insufficient documentation

## 2013-01-09 DIAGNOSIS — I2510 Atherosclerotic heart disease of native coronary artery without angina pectoris: Secondary | ICD-10-CM

## 2013-01-09 DIAGNOSIS — I252 Old myocardial infarction: Secondary | ICD-10-CM | POA: Insufficient documentation

## 2013-01-09 DIAGNOSIS — Z951 Presence of aortocoronary bypass graft: Secondary | ICD-10-CM | POA: Insufficient documentation

## 2013-01-09 DIAGNOSIS — K219 Gastro-esophageal reflux disease without esophagitis: Secondary | ICD-10-CM | POA: Insufficient documentation

## 2013-01-09 DIAGNOSIS — I10 Essential (primary) hypertension: Secondary | ICD-10-CM | POA: Insufficient documentation

## 2013-01-09 DIAGNOSIS — G4733 Obstructive sleep apnea (adult) (pediatric): Secondary | ICD-10-CM | POA: Insufficient documentation

## 2013-01-09 DIAGNOSIS — E785 Hyperlipidemia, unspecified: Secondary | ICD-10-CM | POA: Insufficient documentation

## 2013-01-09 DIAGNOSIS — M129 Arthropathy, unspecified: Secondary | ICD-10-CM | POA: Insufficient documentation

## 2013-01-09 DIAGNOSIS — I5022 Chronic systolic (congestive) heart failure: Secondary | ICD-10-CM

## 2013-01-09 DIAGNOSIS — I2699 Other pulmonary embolism without acute cor pulmonale: Secondary | ICD-10-CM

## 2013-01-09 DIAGNOSIS — I2589 Other forms of chronic ischemic heart disease: Secondary | ICD-10-CM | POA: Insufficient documentation

## 2013-01-09 NOTE — Assessment & Plan Note (Signed)
No evidence of ischemia. Continue current regimen.   

## 2013-01-09 NOTE — Addendum Note (Signed)
Encounter addended by: Theresia Bough, CMA on: 01/09/2013 12:02 PM<BR>     Documentation filed: Patient Instructions Section

## 2013-01-09 NOTE — Progress Notes (Signed)
Patient ID: Joseph Hamilton, male   DOB: 03-Mar-1941, 72 y.o.   MRN: [id number]  Hematologist: Dr Myna Hidalgo PCP: Dr Ty Hilts  HPI:  [name] is a very pleasant 72 year old male with a history of coronary artery disease, status post previous myocardial infarction, as well as redo coronary bypass grafting in 2003.  He also has congestive heart failure related to an ischemic cardiomyopathy with significant recovery of LV function. Last EF 40-45% (5/13)  The remainder of his medical history is notable for a history of pulmonary emboli in 2005, CRI baseline 1.8-2.0, hypertension, hyperlipidemia, OSA, and chronic dizziness/headaches, which have been evaluated extensively.  Admitted to [facility] 02/11/12 with NSTEMI (non-ST elevated myocardial infarction) manifested as scapular pain. Peak troponin 1.8 02/12/12 ECHO EF 40-45% RA  8 RV 33/10  PA 35/19 (26) PCWP mean 14 LV 131/14 mmHg  AO 123/69 with a mean of 93 mmHg  Oxygen saturations:  PA 65%  AO 93%  Cardiac Output (Fick) 5.77 Cardiac Index (Fick) 2.65  1. Severe 3 vessel obstructive coronary disease.  2. Patent saphenous vein graft to the distal RCA  3. Patent LIMA graft to the mid LAD supplying the second diagonal branch.  4. Other saphenous vein grafts are occluded. These are presumably the saphenous vein graft to the obtuse marginal vessel and what was called a ramus branch.  5. Normal right heart pressures.  Felt to be no clear culprit or easily revascularizable lesions. If symptoms progressed Rotoblator of LCX felt to be high risk option.   ICD reached EOL in 12/13 and was extracted and not re-implanted.   Feels ok. Goes to [facility] hospital 3-5x/week to work out on equipment. No CP. No edema, orthopnea, PND. Weight stable.   ROS: All systems negative except as listed in HPI, PMH and Problem List.  Past Medical History  Diagnosis Date  . Coronary artery disease     s/p MI X 2, REDO CABG 2003, MYOVIEW 12/09 EF 28% , EXTENSIVE ANTERIOR, INFERIOR AND  LATERAL INFARCT. VERY MILD ANTERIOR ISCHEMIA  . CHF (congestive heart failure)     SECONDARY TO ISCHEMIC CARDIOMYOPATHY ; EF 28% BY MYOVIEW ; 50% BY ECHO 07/2008; ECHO 09/2009 EF "MILD TO MODERATELY REDUCED"  . Hypertension   . Hyperlipidemia   . Dizziness     CHRONIC; s/p EXTENSIVE EVAL, s/p EVAL @ [facility]; HEADACHE:  s/p EXTENSIVE EVAL; ON ELAVIL  . Sleep apnea 03/31/10    UNABLE TO TOLERATE CPAP; > HOME SLEEP TEST AHI 15.6  . Obesity   . Dupuytren contracture   . Nephrolithiasis   . CRI (chronic renal insufficiency)     BASELINE Cr. 1.7 ----DR. MATTINGLY  . Carotid artery occlusion     0-39% BILATERALLY W/CHRONICALLY OCCLUDED L VERTEBRAL  . Angina   . ICD (implantable cardiac defibrillator) in place   . Pacemaker   . PE (pulmonary embolism) 2005  . Myocardial infarction     x3  . Diabetes mellitus without complication   . GERD (gastroesophageal reflux disease)   . Arthritis     Current Outpatient Prescriptions  Medication Sig Dispense Refill  . amitriptyline (ELAVIL) 25 MG tablet Take 1 tablet (25 mg total) by mouth at bedtime.  30 tablet  5  . aspirin EC 81 MG tablet Take 1 tablet (81 mg total) by mouth daily.  90 tablet  3  . carvedilol (COREG) 25 MG tablet 25mg  AM and 12.5mg  PM      . CRESTOR 40 MG tablet TAKE ONE (1)  TABLET EACH DAY  30 tablet  2  . furosemide (LASIX) 40 MG tablet Take 1 tablet (40 mg total) by mouth daily.  30 tablet  2  . isosorbide mononitrate (IMDUR) 60 MG 24 hr tablet Take 1 tablet (60 mg total) by mouth daily.  30 tablet  6  . [name] 3 MG tablet Take 1 tablet (3 mg total) by mouth as directed.  40 tablet  3  . nateglinide (STARLIX) 60 MG tablet Take 1 tablet (60 mg total) by mouth 2 (two) times daily with a meal. Take with the two largest meals of the day  60 tablet  3  . nitroGLYCERIN (NITROSTAT) 0.4 MG SL tablet Place 1 tablet (0.4 mg total) under the tongue every 5 (five) minutes x 3 doses as needed for chest pain.  25 tablet  3  . omeprazole  (PRILOSEC) 40 MG capsule Take 1 capsule (40 mg total) by mouth daily.  30 capsule  11  . ramipril (ALTACE) 10 MG capsule Take 1 capsule (10 mg total) by mouth daily.  30 capsule  5  . zolpidem (AMBIEN CR) 12.5 MG CR tablet Take 1 tablet (12.5 mg total) by mouth at bedtime as needed for sleep.  30 tablet  1   No current facility-administered medications for this encounter.     PHYSICAL EXAM: Filed Vitals:   01/09/13 1053  BP: 110/68  Pulse: 77   Weight change:  General:  Well appearing. No resp difficulty HEENT: normal Neck: supple. JVP flat. Carotids 2+ bilaterally; no bruits. No lymphadenopathy or thryomegaly appreciated. Cor: PMI normal. Regular rate & rhythm. No rubs, gallops or murmurs. Lungs: clear Abdomen: soft, nontender, nondistended. No hepatosplenomegaly. No bruits or masses. Good bowel sounds. Extremities: no cyanosis, clubbing, rash, tr edema Neuro: alert & orientedx3, cranial nerves grossly intact. Moves all 4 extremities w/o difficulty. Affect pleasant. Skin: Chest keloid present central aspect.    ASSESSMENT & PLAN:

## 2013-01-09 NOTE — Assessment & Plan Note (Signed)
Had PE in setting of being sedentary just after retirement. No other clear provoking factor. He had a negative hypercoaguable work-up with Dr. Myna Hidalgo in 2/13. He is interested in stopping coumadin (after 9 years of therapy). We discussed the risks/benefits of this. I think it may be reasonable to stop coumadin. If he would like to continue anti-coagulation would recommend NOAC like Xarelto if he can afford.

## 2013-01-09 NOTE — Patient Instructions (Signed)
Your physician recommends that you schedule a follow-up appointment in: 6 months  

## 2013-01-09 NOTE — Assessment & Plan Note (Signed)
Doing well. NYHA II. Volume status looks good. On good meds. Will not use spiro due to Cr ~ 2.0. Check labs today.

## 2013-01-15 ENCOUNTER — Ambulatory Visit (INDEPENDENT_AMBULATORY_CARE_PROVIDER_SITE_OTHER): Payer: Medicare Other | Admitting: Family Medicine

## 2013-01-15 ENCOUNTER — Encounter: Payer: Self-pay | Admitting: Family Medicine

## 2013-01-15 VITALS — BP 102/64 | HR 79 | Temp 97.6°F | Ht 69.0 in | Wt 216.1 lb

## 2013-01-15 DIAGNOSIS — Z7901 Long term (current) use of anticoagulants: Secondary | ICD-10-CM

## 2013-01-15 DIAGNOSIS — R7309 Other abnormal glucose: Secondary | ICD-10-CM

## 2013-01-15 DIAGNOSIS — E119 Type 2 diabetes mellitus without complications: Secondary | ICD-10-CM

## 2013-01-15 DIAGNOSIS — I214 Non-ST elevation (NSTEMI) myocardial infarction: Secondary | ICD-10-CM

## 2013-01-15 DIAGNOSIS — N259 Disorder resulting from impaired renal tubular function, unspecified: Secondary | ICD-10-CM

## 2013-01-15 DIAGNOSIS — I5022 Chronic systolic (congestive) heart failure: Secondary | ICD-10-CM

## 2013-01-15 DIAGNOSIS — E785 Hyperlipidemia, unspecified: Secondary | ICD-10-CM

## 2013-01-15 DIAGNOSIS — R739 Hyperglycemia, unspecified: Secondary | ICD-10-CM

## 2013-01-15 DIAGNOSIS — I25810 Atherosclerosis of coronary artery bypass graft(s) without angina pectoris: Secondary | ICD-10-CM

## 2013-01-15 DIAGNOSIS — I509 Heart failure, unspecified: Secondary | ICD-10-CM

## 2013-01-15 LAB — CBC
MCH: 29.3 pg (ref 26.0–34.0)
MCHC: 33.8 g/dL (ref 30.0–36.0)
MCV: 86.6 fL (ref 78.0–100.0)
Platelets: 236 10*3/uL (ref 150–400)
RBC: 4.41 MIL/uL (ref 4.22–5.81)
RDW: 15.5 % (ref 11.5–15.5)

## 2013-01-15 LAB — HEPATIC FUNCTION PANEL
ALT: 15 U/L (ref 0–53)
AST: 12 U/L (ref 0–37)
Alkaline Phosphatase: 45 U/L (ref 39–117)
Indirect Bilirubin: 0.4 mg/dL (ref 0.0–0.9)
Total Protein: 7.2 g/dL (ref 6.0–8.3)

## 2013-01-15 LAB — RENAL FUNCTION PANEL
BUN: 54 mg/dL — ABNORMAL HIGH (ref 6–23)
CO2: 25 mEq/L (ref 19–32)
Chloride: 105 mEq/L (ref 96–112)
Glucose, Bld: 77 mg/dL (ref 70–99)
Potassium: 5.4 mEq/L — ABNORMAL HIGH (ref 3.5–5.3)

## 2013-01-15 LAB — LIPID PANEL
Cholesterol: 125 mg/dL (ref 0–200)
Total CHOL/HDL Ratio: 4.2 Ratio

## 2013-01-15 MED ORDER — ASPIRIN EC 325 MG PO TBEC: 325.0000 mg | DELAYED_RELEASE_TABLET | Freq: Every day | ORAL | Status: DC

## 2013-01-15 NOTE — Patient Instructions (Addendum)

## 2013-01-17 NOTE — Progress Notes (Signed)
Quick Note:  Patient Informed and voiced understanding. Mailing a copy of labs to patient.  Labs ordered ______

## 2013-01-18 NOTE — Assessment & Plan Note (Signed)
Doing well on Crestor

## 2013-01-18 NOTE — Assessment & Plan Note (Signed)
He is off Coumadin and agrees to restart Aspirin

## 2013-01-18 NOTE — Assessment & Plan Note (Signed)
Reports triple bypass in 1992 and again in 1995

## 2013-01-18 NOTE — Progress Notes (Signed)
Patient ID: Joseph Hamilton, male   DOB: Aug 10, 1941, 72 y.o.   MRN: [id number] [name] [id number] Feb 14, 1941 01/18/2013      Progress Note-Follow Up  Subjective  Chief Complaint  Chief Complaint  Patient presents with  . Follow-up    HPI  Patient is a 72 year old Caucasian male who is in today for followup. He is very compensated past medical history including numerous MIs and cardiac bypass surgeries. His last MI in 2013 underwent cardiac rehabilitation last summer. He feels fairly well. No recent illness. No chest pain or palpitations. No shortness of breath GI or GU concerns. He was on Coumadin for history of a PE 9 years ago and has recently reduced off. Has not restarted aspirin but agrees to do so. No fevers or chills no headaches. No other complaints noted. Taking medications as prescribed otherwise.  Past Medical History  Diagnosis Date  . Coronary artery disease     s/p MI X 2, REDO CABG 2003, MYOVIEW 12/09 EF 28% , EXTENSIVE ANTERIOR, INFERIOR AND LATERAL INFARCT. VERY MILD ANTERIOR ISCHEMIA  . CHF (congestive heart failure)     SECONDARY TO ISCHEMIC CARDIOMYOPATHY ; EF 28% BY MYOVIEW ; 50% BY ECHO 07/2008; ECHO 09/2009 EF "MILD TO MODERATELY REDUCED"  . Hypertension   . Hyperlipidemia   . Dizziness     CHRONIC; s/p EXTENSIVE EVAL, s/p EVAL @ [facility]; HEADACHE:  s/p EXTENSIVE EVAL; ON ELAVIL  . Sleep apnea 03/31/10    UNABLE TO TOLERATE CPAP; > HOME SLEEP TEST AHI 15.6  . Obesity   . Dupuytren contracture   . Nephrolithiasis   . CRI (chronic renal insufficiency)     BASELINE Cr. 1.7 ----DR. MATTINGLY  . Carotid artery occlusion     0-39% BILATERALLY W/CHRONICALLY OCCLUDED L VERTEBRAL  . Angina   . ICD (implantable cardiac defibrillator) in place   . Pacemaker   . PE (pulmonary embolism) 2005  . Myocardial infarction     x3  . Diabetes mellitus without complication   . GERD (gastroesophageal reflux disease)   . Arthritis     Past Surgical History  Procedure  Laterality Date  . Insert / replace / remove pacemaker  2006    s/p GUIDANT IMPLANTABLE CARDIVERTER DEFIBRILLATOR  . Cataract extraction    . Tonsillectomy      AS A CHILD  . Kidney stone surgery    . Coronary artery bypass graft  1995     AND 2003  . Eye surgery    . Pacemaker lead removal  08/28/2012    Procedure: PACEMAKER LEAD REMOVAL;  Surgeon: Marinus Maw, MD;  Location: [facility] OR;  Service: Cardiovascular;  Laterality: N/A;    History reviewed. No pertinent family history.  History   Social History  . Marital Status: Divorced    Spouse Name: N/A    Number of Children: N/A  . Years of Education: N/A   Occupational History  . RETIRED     WORKS PART TIME   Social History Main Topics  . Smoking status: Former Smoker    Types: Pipe    Quit date: 09/25/1993  . Smokeless tobacco: Never Used  . Alcohol Use: 3.5 oz/week    7 drink(s) per week     Comment: 1 DRINK PER DAY  . Drug Use: No  . Sexually Active: Not on file   Other Topics Concern  . Not on file   Social History Narrative   DIVORCED   LIVES BY HIMSELF  3 CHILDREN ONE IN [place]   TOBACCO USE- QUIT (PIPE) 1995   ETOH 1 A DAY   NO EXERCISE         ICD-[employer].Marland KitchenMarland KitchenREMOTE YES             Current Outpatient Prescriptions on File Prior to Visit  Medication Sig Dispense Refill  . amitriptyline (ELAVIL) 25 MG tablet Take 1 tablet (25 mg total) by mouth at bedtime.  30 tablet  5  . carvedilol (COREG) 25 MG tablet 25mg  AM and 12.5mg  PM      . CRESTOR 40 MG tablet TAKE ONE (1) TABLET EACH DAY  30 tablet  2  . furosemide (LASIX) 40 MG tablet Take 1 tablet (40 mg total) by mouth daily.  30 tablet  2  . isosorbide mononitrate (IMDUR) 60 MG 24 hr tablet Take 1 tablet (60 mg total) by mouth daily.  30 tablet  6  . nateglinide (STARLIX) 60 MG tablet Take 1 tablet (60 mg total) by mouth 2 (two) times daily with a meal. Take with the two largest meals of the day  60 tablet  3  . nitroGLYCERIN  (NITROSTAT) 0.4 MG SL tablet Place 1 tablet (0.4 mg total) under the tongue every 5 (five) minutes x 3 doses as needed for chest pain.  25 tablet  3  . omeprazole (PRILOSEC) 40 MG capsule Take 1 capsule (40 mg total) by mouth daily.  30 capsule  11  . ramipril (ALTACE) 10 MG capsule Take 1 capsule (10 mg total) by mouth daily.  30 capsule  5  . zolpidem (AMBIEN CR) 12.5 MG CR tablet Take 1 tablet (12.5 mg total) by mouth at bedtime as needed for sleep.  30 tablet  1   No current facility-administered medications on file prior to visit.    Allergies  Allergen Reactions  . Ezetimibe-Simvastatin     REACTION: myalgia: TOLERATES PRAVOCHOL OK    Review of Systems  Review of Systems  Constitutional: Negative for fever and malaise/fatigue.  HENT: Negative for congestion.   Eyes: Negative for discharge.  Respiratory: Negative for shortness of breath.   Cardiovascular: Negative for chest pain, palpitations and leg swelling.  Gastrointestinal: Negative for nausea, abdominal pain and diarrhea.  Genitourinary: Negative for dysuria.  Musculoskeletal: Negative for falls.  Skin: Negative for rash.  Neurological: Negative for loss of consciousness and headaches.  Endo/Heme/Allergies: Negative for polydipsia.  Psychiatric/Behavioral: Negative for depression and suicidal ideas. The patient is not nervous/anxious and does not have insomnia.     Objective  BP 102/64  Pulse 79  Temp(Src) 97.6 F (36.4 C) (Oral)  Ht 5\' 9"  (1.753 m)  Wt 216 lb 1.3 oz (98.013 kg)  BMI 31.89 kg/m2  SpO2 97%  Physical Exam  Physical Exam  Constitutional: He is oriented to person, place, and time and well-developed, well-nourished, and in no distress. No distress.  HENT:  Head: Normocephalic and atraumatic.  Eyes: Conjunctivae are normal.  Neck: Neck supple. No thyromegaly present.  Cardiovascular: Normal rate, regular rhythm and normal heart sounds.   No murmur heard. Pulmonary/Chest: Effort normal and  breath sounds normal. No respiratory distress.  Abdominal: He exhibits no distension and no mass. There is no tenderness.  Musculoskeletal: He exhibits no edema.  Neurological: He is alert and oriented to person, place, and time.  Skin: Skin is warm.  Psychiatric: Memory, affect and judgment normal.    Lab Results  Component Value Date   TSH 1.098 01/15/2013   Lab  Results  Component Value Date   WBC 7.2 01/15/2013   HGB 12.9* 01/15/2013   HCT 38.2* 01/15/2013   MCV 86.6 01/15/2013   PLT 236 01/15/2013   Lab Results  Component Value Date   CREATININE 2.43* 01/15/2013   BUN 54* 01/15/2013   NA 140 01/15/2013   K 5.4* 01/15/2013   CL 105 01/15/2013   CO2 25 01/15/2013   Lab Results  Component Value Date   ALT 15 01/15/2013   AST 12 01/15/2013   ALKPHOS 45 01/15/2013   BILITOT 0.5 01/15/2013   Lab Results  Component Value Date   CHOL 125 01/15/2013   Lab Results  Component Value Date   HDL 30* 01/15/2013   Lab Results  Component Value Date   LDLCALC 46 01/15/2013   Lab Results  Component Value Date   TRIG 243* 01/15/2013   Lab Results  Component Value Date   CHOLHDL 4.2 01/15/2013     Assessment & Plan  NSTEMI (non-ST elevated myocardial infarction) Agrees to restart an aspirin daily, has just been taken off Coumadin. Follows with cardiology, enderwent cardiac rehab last summer  SYSTOLIC HEART FAILURE, CHRONIC Patient reports an increase in his EF from 30 to 44 % on recent echo. No recent flares  CAD, AUTOLOGOUS BYPASS GRAFT Reports triple bypass in 1992 and again in 1995  DIABETES MELLITUS, TYPE II Mild elevation of hgba1c encouraged to avoid simple carbs and use his Starlix more regularly. Has not taking very often.   RENAL INSUFFICIENCY creatiine 2.43, following with [facility]  Long term (current) use of anticoagulants He is off Coumadin and agrees to restart Aspirin  HYPERLIPIDEMIA-MIXED Doing well on Crestor

## 2013-01-18 NOTE — Assessment & Plan Note (Signed)
Mild elevation of hgba1c encouraged to avoid simple carbs and use his Starlix more regularly. Has not taking very often.

## 2013-01-18 NOTE — Assessment & Plan Note (Signed)
Agrees to restart an aspirin daily, has just been taken off Coumadin. Follows with cardiology, enderwent cardiac rehab last summer

## 2013-01-18 NOTE — Assessment & Plan Note (Signed)
Patient reports an increase in his EF from 30 to 44 % on recent echo. No recent flares

## 2013-01-18 NOTE — Assessment & Plan Note (Signed)
creatiine 2.43, following with [facility]

## 2013-01-21 ENCOUNTER — Ambulatory Visit (INDEPENDENT_AMBULATORY_CARE_PROVIDER_SITE_OTHER): Payer: Medicare Other | Admitting: *Deleted

## 2013-01-21 DIAGNOSIS — I2699 Other pulmonary embolism without acute cor pulmonale: Secondary | ICD-10-CM

## 2013-01-21 DIAGNOSIS — Z7901 Long term (current) use of anticoagulants: Secondary | ICD-10-CM

## 2013-01-21 LAB — POCT INR: INR: 1

## 2013-01-24 ENCOUNTER — Telehealth (HOSPITAL_COMMUNITY): Payer: Self-pay | Admitting: *Deleted

## 2013-01-24 MED ORDER — RAMIPRIL 5 MG PO CAPS: 5.0000 mg | ORAL_CAPSULE | Freq: Every day | ORAL | Status: DC

## 2013-01-24 NOTE — Telephone Encounter (Signed)
Pt called to let us know that he had lab work with pcp and cr was elevated, he states pcp told him to stop Altace for now and recheck labs in a few weeks, Dr Gala Romney reviewed pt's labs and would like for pt to take Altace at only 5 mg daily and recheck labs in 1 week, pt is agreeable and new rx sent into pharmacy, order mailed to pt for labs next week

## 2013-01-24 NOTE — Addendum Note (Signed)
Addended by: Noralee Space on: 01/24/2013 12:35 PM   Modules accepted: Orders

## 2013-02-07 ENCOUNTER — Other Ambulatory Visit (HOSPITAL_COMMUNITY): Payer: Self-pay | Admitting: *Deleted

## 2013-02-07 MED ORDER — ISOSORBIDE MONONITRATE ER 60 MG PO TB24: 60.0000 mg | ORAL_TABLET | Freq: Every day | ORAL | Status: AC

## 2013-02-13 ENCOUNTER — Telehealth: Payer: Self-pay | Admitting: Internal Medicine

## 2013-02-13 NOTE — Telephone Encounter (Signed)
JUST HAD FOLLOW UP BLOOD TEST WHICH WAS AUTHORIZED BY DR Josefa Half.  RESULTS WILL GO TO HIM AND HE WANTS DR BLYTH TO HAVE A COPY  DR BLYTH REDUCED HIS ALTACE TO 0 AT THE LAST VISIT,. dR bENSIMMONE WAS NOT HAPPY WITH THAT AND HE IS BACK TAKING 5MG  PER DAY

## 2013-02-14 NOTE — Telephone Encounter (Signed)
FYI

## 2013-02-18 ENCOUNTER — Telehealth: Payer: Self-pay

## 2013-02-18 ENCOUNTER — Encounter (HOSPITAL_COMMUNITY): Payer: Self-pay

## 2013-02-18 ENCOUNTER — Ambulatory Visit (HOSPITAL_BASED_OUTPATIENT_CLINIC_OR_DEPARTMENT_OTHER)
Admission: RE | Admit: 2013-02-18 | Discharge: 2013-02-18 | Disposition: A | Discharge status: Home / Self Care | Payer: Medicare Other | Source: Ambulatory Visit | Attending: Internal Medicine | Admitting: Internal Medicine

## 2013-02-18 ENCOUNTER — Inpatient Hospital Stay (HOSPITAL_COMMUNITY): Payer: Medicare Other

## 2013-02-18 ENCOUNTER — Inpatient Hospital Stay (HOSPITAL_COMMUNITY)
Admission: AD | Admit: 2013-02-18 | Discharge: 2013-02-20 | DRG: 291 | Disposition: A | Discharge status: Home / Self Care | Payer: Medicare Other | Source: Ambulatory Visit | Attending: Internal Medicine | Admitting: Internal Medicine

## 2013-02-18 VITALS — BP 134/78 | HR 104 | Wt 217.8 lb

## 2013-02-18 DIAGNOSIS — I82A11 Acute embolism and thrombosis of right axillary vein: Secondary | ICD-10-CM

## 2013-02-18 DIAGNOSIS — Z87891 Personal history of nicotine dependence: Secondary | ICD-10-CM

## 2013-02-18 DIAGNOSIS — E119 Type 2 diabetes mellitus without complications: Secondary | ICD-10-CM | POA: Diagnosis present

## 2013-02-18 DIAGNOSIS — G4733 Obstructive sleep apnea (adult) (pediatric): Secondary | ICD-10-CM | POA: Diagnosis present

## 2013-02-18 DIAGNOSIS — Z6837 Body mass index (BMI) 37.0-37.9, adult: Secondary | ICD-10-CM

## 2013-02-18 DIAGNOSIS — I82409 Acute embolism and thrombosis of unspecified deep veins of unspecified lower extremity: Secondary | ICD-10-CM

## 2013-02-18 DIAGNOSIS — R0609 Other forms of dyspnea: Secondary | ICD-10-CM

## 2013-02-18 DIAGNOSIS — I2510 Atherosclerotic heart disease of native coronary artery without angina pectoris: Secondary | ICD-10-CM

## 2013-02-18 DIAGNOSIS — I252 Old myocardial infarction: Secondary | ICD-10-CM

## 2013-02-18 DIAGNOSIS — R0600 Dyspnea, unspecified: Secondary | ICD-10-CM | POA: Insufficient documentation

## 2013-02-18 DIAGNOSIS — E785 Hyperlipidemia, unspecified: Secondary | ICD-10-CM | POA: Diagnosis present

## 2013-02-18 DIAGNOSIS — Z79899 Other long term (current) drug therapy: Secondary | ICD-10-CM

## 2013-02-18 DIAGNOSIS — K219 Gastro-esophageal reflux disease without esophagitis: Secondary | ICD-10-CM | POA: Diagnosis present

## 2013-02-18 DIAGNOSIS — I129 Hypertensive chronic kidney disease with stage 1 through stage 4 chronic kidney disease, or unspecified chronic kidney disease: Secondary | ICD-10-CM | POA: Diagnosis present

## 2013-02-18 DIAGNOSIS — R0989 Other specified symptoms and signs involving the circulatory and respiratory systems: Secondary | ICD-10-CM

## 2013-02-18 DIAGNOSIS — Z951 Presence of aortocoronary bypass graft: Secondary | ICD-10-CM

## 2013-02-18 DIAGNOSIS — I2589 Other forms of chronic ischemic heart disease: Secondary | ICD-10-CM | POA: Diagnosis present

## 2013-02-18 DIAGNOSIS — I2699 Other pulmonary embolism without acute cor pulmonale: Secondary | ICD-10-CM

## 2013-02-18 DIAGNOSIS — Z86711 Personal history of pulmonary embolism: Secondary | ICD-10-CM

## 2013-02-18 DIAGNOSIS — I824Y9 Acute embolism and thrombosis of unspecified deep veins of unspecified proximal lower extremity: Secondary | ICD-10-CM | POA: Diagnosis present

## 2013-02-18 DIAGNOSIS — I25810 Atherosclerosis of coronary artery bypass graft(s) without angina pectoris: Secondary | ICD-10-CM

## 2013-02-18 DIAGNOSIS — E669 Obesity, unspecified: Secondary | ICD-10-CM | POA: Diagnosis present

## 2013-02-18 DIAGNOSIS — Z7982 Long term (current) use of aspirin: Secondary | ICD-10-CM

## 2013-02-18 DIAGNOSIS — I5043 Acute on chronic combined systolic (congestive) and diastolic (congestive) heart failure: Principal | ICD-10-CM

## 2013-02-18 DIAGNOSIS — I509 Heart failure, unspecified: Secondary | ICD-10-CM | POA: Diagnosis present

## 2013-02-18 DIAGNOSIS — N184 Chronic kidney disease, stage 4 (severe): Secondary | ICD-10-CM | POA: Diagnosis present

## 2013-02-18 DIAGNOSIS — I82401 Acute embolism and thrombosis of unspecified deep veins of right lower extremity: Secondary | ICD-10-CM

## 2013-02-18 LAB — BASIC METABOLIC PANEL
BUN: 56 mg/dL — ABNORMAL HIGH (ref 6–23)
Chloride: 102 mEq/L (ref 96–112)
GFR calc Af Amer: 28 mL/min — ABNORMAL LOW (ref >90)
Potassium: 4.8 mEq/L (ref 3.5–5.1)

## 2013-02-18 LAB — CBC WITH DIFFERENTIAL/PLATELET
HCT: 38.4 % — ABNORMAL LOW (ref 39.0–52.0)
Hemoglobin: 12.9 g/dL — ABNORMAL LOW (ref 13.0–17.0)
Lymphocytes Relative: 17 % (ref 12–46)
Lymphs Abs: 1.4 10*3/uL (ref 0.7–4.0)
Monocytes Absolute: 1 10*3/uL (ref 0.1–1.0)
Monocytes Relative: 13 % — ABNORMAL HIGH (ref 3–12)
Neutro Abs: 5.2 10*3/uL (ref 1.7–7.7)
WBC: 7.9 10*3/uL (ref 4.0–10.5)

## 2013-02-18 LAB — PRO B NATRIURETIC PEPTIDE: Pro B Natriuretic peptide (BNP): 1092 pg/mL — ABNORMAL HIGH (ref 0–125)

## 2013-02-18 MED ORDER — AMITRIPTYLINE HCL 25 MG PO TABS
25.0000 mg | ORAL_TABLET | Freq: Every day | ORAL | Status: DC
  Administered 2013-02-18 – 2013-02-19 (×2): 25 mg via ORAL
  Filled 2013-02-18 (×3): qty 1

## 2013-02-18 MED ORDER — ZOLPIDEM TARTRATE 5 MG PO TABS
5.0000 mg | ORAL_TABLET | Freq: Every evening | ORAL | Status: DC | PRN
  Administered 2013-02-18: 5 mg via ORAL
  Filled 2013-02-18: qty 1

## 2013-02-18 MED ORDER — ROSUVASTATIN CALCIUM 40 MG PO TABS
40.0000 mg | ORAL_TABLET | Freq: Every day | ORAL | Status: DC
  Administered 2013-02-18 – 2013-02-19 (×2): 40 mg via ORAL
  Filled 2013-02-18 (×3): qty 1

## 2013-02-18 MED ORDER — SODIUM CHLORIDE 0.9 % IJ SOLN: 3.0000 mL | INTRAMUSCULAR | Status: DC | PRN

## 2013-02-18 MED ORDER — NATEGLINIDE 60 MG PO TABS
60.0000 mg | ORAL_TABLET | Freq: Two times a day (BID) | ORAL | Status: DC
  Administered 2013-02-19 – 2013-02-20 (×3): 60 mg via ORAL
  Filled 2013-02-18 (×6): qty 1

## 2013-02-18 MED ORDER — NON FORMULARY: 40.0000 mg | Freq: Every day | Status: DC

## 2013-02-18 MED ORDER — SODIUM CHLORIDE 0.9 % IJ SOLN
3.0000 mL | Freq: Two times a day (BID) | INTRAMUSCULAR | Status: DC
  Administered 2013-02-18 – 2013-02-19 (×4): 3 mL via INTRAVENOUS

## 2013-02-18 MED ORDER — SODIUM CHLORIDE 0.9 % IV SOLN: 250.0000 mL | INTRAVENOUS | Status: DC | PRN

## 2013-02-18 MED ORDER — ONDANSETRON HCL 4 MG/2ML IJ SOLN: 4.0000 mg | Freq: Four times a day (QID) | INTRAMUSCULAR | Status: DC | PRN

## 2013-02-18 MED ORDER — ACETAMINOPHEN 325 MG PO TABS: 650.0000 mg | ORAL_TABLET | ORAL | Status: DC | PRN

## 2013-02-18 MED ORDER — CARVEDILOL 25 MG PO TABS
25.0000 mg | ORAL_TABLET | Freq: Every morning | ORAL | Status: DC
  Administered 2013-02-19 – 2013-02-20 (×2): 25 mg via ORAL
  Filled 2013-02-18 (×2): qty 1

## 2013-02-18 MED ORDER — TECHNETIUM TO 99M ALBUMIN AGGREGATED
6.0000 | Freq: Once | INTRAVENOUS | Status: AC | PRN
  Administered 2013-02-18: 6 via INTRAVENOUS

## 2013-02-18 MED ORDER — ISOSORBIDE MONONITRATE ER 60 MG PO TB24
60.0000 mg | ORAL_TABLET | Freq: Every day | ORAL | Status: DC
  Administered 2013-02-19 – 2013-02-20 (×2): 60 mg via ORAL
  Filled 2013-02-18 (×2): qty 1

## 2013-02-18 MED ORDER — TECHNETIUM TC 99M DIETHYLENETRIAME-PENTAACETIC ACID: 40.0000 | Freq: Once | INTRAVENOUS | Status: AC | PRN

## 2013-02-18 MED ORDER — ENOXAPARIN SODIUM 100 MG/ML ~~LOC~~ SOLN
95.0000 mg | Freq: Two times a day (BID) | SUBCUTANEOUS | Status: DC
  Filled 2013-02-18 (×2): qty 1

## 2013-02-18 MED ORDER — ASPIRIN EC 325 MG PO TBEC
325.0000 mg | DELAYED_RELEASE_TABLET | Freq: Every day | ORAL | Status: DC
  Administered 2013-02-19 – 2013-02-20 (×2): 325 mg via ORAL
  Filled 2013-02-18 (×3): qty 1

## 2013-02-18 MED ORDER — PANTOPRAZOLE SODIUM 40 MG PO TBEC
40.0000 mg | DELAYED_RELEASE_TABLET | Freq: Every day | ORAL | Status: DC
Start: 2013-02-18 — End: 2013-02-20
  Administered 2013-02-19 – 2013-02-20 (×2): 40 mg via ORAL
  Filled 2013-02-18 (×2): qty 1

## 2013-02-18 MED ORDER — RAMIPRIL 5 MG PO CAPS
5.0000 mg | ORAL_CAPSULE | Freq: Every day | ORAL | Status: DC
  Administered 2013-02-19 – 2013-02-20 (×2): 5 mg via ORAL
  Filled 2013-02-18 (×2): qty 1

## 2013-02-18 MED ORDER — CARVEDILOL 12.5 MG PO TABS
12.5000 mg | ORAL_TABLET | Freq: Every day | ORAL | Status: DC
  Administered 2013-02-18 – 2013-02-19 (×2): 12.5 mg via ORAL
  Filled 2013-02-18 (×3): qty 1

## 2013-02-18 MED ORDER — FUROSEMIDE 10 MG/ML IJ SOLN
80.0000 mg | Freq: Once | INTRAMUSCULAR | Status: AC
  Administered 2013-02-18: 80 mg via INTRAVENOUS
  Filled 2013-02-18: qty 8

## 2013-02-18 MED ORDER — ENOXAPARIN SODIUM 100 MG/ML ~~LOC~~ SOLN
95.0000 mg | SUBCUTANEOUS | Status: DC
  Administered 2013-02-18: 95 mg via SUBCUTANEOUS
  Filled 2013-02-18 (×2): qty 1

## 2013-02-18 NOTE — Progress Notes (Signed)
ANTICOAGULATION CONSULT NOTE - Initial Consult  Pharmacy Consult for Lovenox Indication: rule out PE  Allergies  Allergen Reactions  . Ezetimibe-Simvastatin     REACTION: myalgia: TOLERATES PRAVOCHOL OK    Patient Measurements: Height: 5\' 3"  (160 cm) Weight: 213 lb 8 oz (96.843 kg) IBW/kg (Calculated) : 56.9   Medical History: Past Medical History  Diagnosis Date  . Coronary artery disease     s/p MI X 2, REDO CABG 2003, MYOVIEW 12/09 EF 28% , EXTENSIVE ANTERIOR, INFERIOR AND LATERAL INFARCT. VERY MILD ANTERIOR ISCHEMIA  . CHF (congestive heart failure)     SECONDARY TO ISCHEMIC CARDIOMYOPATHY ; EF 28% BY MYOVIEW ; 50% BY ECHO 07/2008; ECHO 09/2009 EF "MILD TO MODERATELY REDUCED"  . Hypertension   . Hyperlipidemia   . Dizziness     CHRONIC; s/p EXTENSIVE EVAL, s/p EVAL @ [facility]; HEADACHE:  s/p EXTENSIVE EVAL; ON ELAVIL  . Sleep apnea 03/31/10    UNABLE TO TOLERATE CPAP; > HOME SLEEP TEST AHI 15.6  . Obesity   . Dupuytren contracture   . Nephrolithiasis   . CRI (chronic renal insufficiency)     BASELINE Cr. 1.7 ----DR. MATTINGLY  . Carotid artery occlusion     0-39% BILATERALLY W/CHRONICALLY OCCLUDED L VERTEBRAL  . Angina   . ICD (implantable cardiac defibrillator) in place   . Pacemaker   . PE (pulmonary embolism) 2005  . Myocardial infarction     x3  . Diabetes mellitus without complication   . GERD (gastroesophageal reflux disease)   . Arthritis     Assessment: 72 year old male with a history of CAD, s/p MI as well as redo CABG in 2003.  Also, has CHF with last EF of 40 -45% in May of 2013.  He has a history of PE in 2005 for which anticoagulants were recently stopped in April of this year.  Starting Lovenox until new PE is ruled out.  Goal of Therapy:  Anti-Xa level 0.6-1.2 units/ml 4hrs after LMWH dose given Monitor platelets by anticoagulation protocol: Yes   Plan:  1) Lovenox 95 mg sq Q 12 hours 2) Follow up plans.  Thank you. Okey Regal,  PharmD [phone]  02/18/2013,3:43 PM

## 2013-02-18 NOTE — Progress Notes (Signed)
Patient ID: Joseph Hamilton, male   DOB: 23-Nov-1940, 72 y.o.   MRN: [id number]  Hematologist: Dr Myna Hidalgo PCP: Dr Ty Hilts  HPI:  [name] is a very pleasant 72 year old male with a history of coronary artery disease, status post previous myocardial infarction, as well as redo coronary bypass grafting in 2003.  He also has congestive heart failure related to an ischemic cardiomyopathy with significant recovery of LV function. Last EF 40-45% (5/13)  The remainder of his medical history is notable for a history of pulmonary emboli in 2005, CRI baseline 2.0-2.5, hypertension, hyperlipidemia, OSA, and chronic dizziness/headaches, which have been evaluated extensively.ICD reached EOL in 12/13 and was extracted and not re-implanted.   Admitted to [facility] 02/11/12 with NSTEMI (non-ST elevated myocardial infarction) manifested as scapular pain. Peak troponin 1.8  02/12/12 ECHO EF 40-45% RA  8 RV 33/10  PA 35/19 (26) PCWP mean 14 LV 131/14 mmHg  AO 123/69 with a mean of 93 mmHg  Oxygen saturations:  PA 65%  AO 93%  Cardiac Output (Fick) 5.77 Cardiac Index (Fick) 2.65  1. Severe 3 vessel obstructive coronary disease.  2. Patent saphenous vein graft to the distal RCA  3. Patent LIMA graft to the mid LAD supplying the second diagonal branch.  4. Other saphenous vein grafts are occluded. These are presumably the saphenous vein graft to the obtuse marginal vessel and what was called a ramus branch.  5. Normal right heart pressures.  Felt to be no clear culprit or easily revascularizable lesions. If symptoms progressed Rotoblator of LCX felt to be high risk option.   He presents as an acute work in. Anticoagulants stopped in April 2014 as PE  > 5 years old. Ramipril also cut back to 5 mg daily recently due to hyperkalemia. Complains dyspnea with minimalexertion that progressed over the weekend. He says that the dyspnea feels similar to PEs. Complains of cough. Denies PND. + Orthopnea. Weight down to pounds. +  edema. No angina.   ROS: All systems negative except as listed in HPI, PMH and Problem List.  Past Medical History  Diagnosis Date  . Coronary artery disease     s/p MI X 2, REDO CABG 2003, MYOVIEW 12/09 EF 28% , EXTENSIVE ANTERIOR, INFERIOR AND LATERAL INFARCT. VERY MILD ANTERIOR ISCHEMIA  . CHF (congestive heart failure)     SECONDARY TO ISCHEMIC CARDIOMYOPATHY ; EF 28% BY MYOVIEW ; 50% BY ECHO 07/2008; ECHO 09/2009 EF "MILD TO MODERATELY REDUCED"  . Hypertension   . Hyperlipidemia   . Dizziness     CHRONIC; s/p EXTENSIVE EVAL, s/p EVAL @ [facility]; HEADACHE:  s/p EXTENSIVE EVAL; ON ELAVIL  . Sleep apnea 03/31/10    UNABLE TO TOLERATE CPAP; > HOME SLEEP TEST AHI 15.6  . Obesity   . Dupuytren contracture   . Nephrolithiasis   . CRI (chronic renal insufficiency)     BASELINE Cr. 1.7 ----DR. MATTINGLY  . Carotid artery occlusion     0-39% BILATERALLY W/CHRONICALLY OCCLUDED L VERTEBRAL  . Angina   . ICD (implantable cardiac defibrillator) in place   . Pacemaker   . PE (pulmonary embolism) 2005  . Myocardial infarction     x3  . Diabetes mellitus without complication   . GERD (gastroesophageal reflux disease)   . Arthritis     Current Outpatient Prescriptions  Medication Sig Dispense Refill  . amitriptyline (ELAVIL) 25 MG tablet Take 1 tablet (25 mg total) by mouth at bedtime.  30 tablet  5  . aspirin  EC 325 MG tablet Take 1 tablet (325 mg total) by mouth daily.  30 tablet  0  . carvedilol (COREG) 25 MG tablet 25mg  AM and 12.5mg  PM      . CRESTOR 40 MG tablet TAKE ONE (1) TABLET EACH DAY  30 tablet  2  . furosemide (LASIX) 40 MG tablet Take 1 tablet (40 mg total) by mouth daily.  30 tablet  2  . isosorbide mononitrate (IMDUR) 60 MG 24 hr tablet Take 1 tablet (60 mg total) by mouth daily.  30 tablet  6  . nateglinide (STARLIX) 60 MG tablet Take 1 tablet (60 mg total) by mouth 2 (two) times daily with a meal. Take with the two largest meals of the day  60 tablet  3  . nitroGLYCERIN  (NITROSTAT) 0.4 MG SL tablet Place 1 tablet (0.4 mg total) under the tongue every 5 (five) minutes x 3 doses as needed for chest pain.  25 tablet  3  . omeprazole (PRILOSEC) 40 MG capsule Take 1 capsule (40 mg total) by mouth daily.  30 capsule  11  . ramipril (ALTACE) 5 MG capsule Take 1 capsule (5 mg total) by mouth daily.  30 capsule  5  . zolpidem (AMBIEN CR) 12.5 MG CR tablet Take 1 tablet (12.5 mg total) by mouth at bedtime as needed for sleep.  30 tablet  1   No current facility-administered medications for this encounter.     PHYSICAL EXAM: Filed Vitals:   02/18/13 1330  BP: 134/78  Pulse: 104   Weight change:  General:  Dyspneic on minimal exertion HEENT: normal Neck: supple. JVP 7 + prominent CV waves. Carotids 2+ bilaterally; no bruits. No lymphadenopathy or thryomegaly appreciated. Cor: PMI normal. Tachy regular. No rubs, gallops or murmurs. Lungs: clear. Decreased at bases.  Abdomen: soft, nontender, distended. No hepatosplenomegaly. No bruits or masses. Good bowel sounds. Extremities: no cyanosis, clubbing, rash, LLE 2+ RLE 1+  edema Neuro: alert & orientedx3, cranial nerves grossly intact. Moves all 4 extremities w/o difficulty. Affect pleasant. Skin: Chest keloid present central aspect.    ASSESSMENT & PLAN:

## 2013-02-18 NOTE — H&P (Addendum)
Advanced Heart Failure Team History and Physical Note    HPI:    [name] is a very pleasant 72 year old male with a history of coronary artery disease, status post previous myocardial infarction, as well as redo coronary bypass grafting in 2003. He also has congestive heart failure related to an ischemic cardiomyopathy with significant recovery of LV function. Last EF 40-45% (5/13) The remainder of his medical history is notable for a history of pulmonary emboli in 2005, CRI baseline 2.0-2.5, hypertension, hyperlipidemia, OSA, and chronic dizziness/headaches, which have been evaluated extensively. ICD reached EOL in 12/13 and was extracted and not re-implanted.   Admitted to [facility] 02/11/12 with NSTEMI (non-ST elevated myocardial infarction) manifested as scapular pain. Peak troponin 1.8   02/12/12 ECHO EF 40-45%   RA 8  RV 33/10  PA 35/19 (26)  PCWP mean 14  LV 131/14 mmHg  AO 123/69 with a mean of 93 mmHg  Oxygen saturations:  PA 65%  AO 93%  Cardiac Output (Fick) 5.77 Cardiac Index (Fick) 2.65  1. Severe 3 vessel obstructive coronary disease.  2. Patent saphenous vein graft to the distal RCA  3. Patent LIMA graft to the mid LAD supplying the second diagonal branch.  4. Other saphenous vein grafts are occluded. These are presumably the saphenous vein graft to the obtuse marginal vessel and what was called a ramus branch.  5. Normal right heart pressures.  Felt to be no clear culprit or easily revascularizable lesions. If symptoms progressed Rotoblator of LCX felt to be high risk option.   He presented to HF Clinic today as an acute work in due to severe dyspnea. Anticoagulants stopped in April 2014 as PE > 68 years old. Ramipril also cut back to 5 mg daily recently due to hyperkalemia. Complains dyspnea with minimal exertion that progressed over the weekend. He says that the dyspnea feels similar to PEs. Complains of cough. Denies PND. + Orthopnea. Weight down to pounds. + edema. No angina.     Review of Systems: [y] = yes, [ ]  = no   General: Weight gain [ ] ; Weight loss [ ] ; Anorexia [ ] ; Fatigue [ y]; Fever [ ] ; Chills [ ] ; Weakness [ ]   Cardiac: Chest pain/pressure [ ] ; Resting SOB [ ] ; Exertional SOB Cove.Etienne ]; Orthopnea [ y]; Pedal Edema Cove.Etienne ]; Palpitations [ ] ; Syncope [ ] ; Presyncope [ ] ; Paroxysmal nocturnal dyspnea[ ]   Pulmonary: Cough [ ] ; Wheezing[ ] ; Hemoptysis[ ] ; Sputum [ ] ; Snoring [ ]   GI: Vomiting[ ] ; Dysphagia[ ] ; Melena[ ] ; Hematochezia [ ] ; Heartburn[ ] ; Abdominal pain [ ] ; Constipation [ ] ; Diarrhea [ ] ; BRBPR [ ]   GU: Hematuria[ ] ; Dysuria [ ] ; Nocturia[ ]   Vascular: Pain in legs with walking [ ] ; Pain in feet with lying flat [ ] ; Non-healing sores [ ] ; Stroke [ ] ; TIA [ ] ; Slurred speech [ ] ;  Neuro: Headaches[y ]; Vertigo[ ] ; Seizures[ ] ; Paresthesias[ ] ;Blurred vision [ ] ; Diplopia [ ] ; Vision changes [ ]   Ortho/Skin: Arthritis [ ] ; Joint pain Cove.Etienne ]; Muscle pain [ ] ; Joint swelling [ ] ; Back Pain [ ] ; Rash [ ]   Psych: Depression[ ] ; Anxiety[ ]   Heme: Bleeding problems [ ] ; Clotting disorders [ y]; Anemia [ ]   Endocrine: Diabetes Cove.Etienne ]; Thyroid dysfunction[ ]   Home Medications Prior to Admission medications   Medication Sig Start Date End Date Taking? Authorizing Provider  amitriptyline (ELAVIL) 25 MG tablet Take 1 tablet (25 mg total) by mouth at bedtime. 10/14/12  Bradd Canary, MD  aspirin EC 325 MG tablet Take 1 tablet (325 mg total) by mouth daily. 01/15/13   Bradd Canary, MD  carvedilol (COREG) 25 MG tablet 25mg  AM and 12.5mg  PM 07/04/12   Laurey Morale, MD  CRESTOR 40 MG tablet TAKE ONE (1) TABLET EACH DAY 01/02/13   Bradd Canary, MD  furosemide (LASIX) 40 MG tablet Take 1 tablet (40 mg total) by mouth daily. 12/24/12   Bradd Canary, MD  isosorbide mononitrate (IMDUR) 60 MG 24 hr tablet Take 1 tablet (60 mg total) by mouth daily. 02/07/13 02/07/14  Dolores Patty, MD  nateglinide (STARLIX) 60 MG tablet Take 1 tablet (60 mg total) by mouth 2 (two)  times daily with a meal. Take with the two largest meals of the day 11/27/12   Bradd Canary, MD  nitroGLYCERIN (NITROSTAT) 0.4 MG SL tablet Place 1 tablet (0.4 mg total) under the tongue every 5 (five) minutes x 3 doses as needed for chest pain. 05/28/12 05/28/13  Dolores Patty, MD  omeprazole (PRILOSEC) 40 MG capsule Take 1 capsule (40 mg total) by mouth daily. 03/15/12 03/15/13  Edwyna Perfect, MD  ramipril (ALTACE) 5 MG capsule Take 1 capsule (5 mg total) by mouth daily. 01/24/13   Dolores Patty, MD  zolpidem (AMBIEN CR) 12.5 MG CR tablet Take 1 tablet (12.5 mg total) by mouth at bedtime as needed for sleep. 12/05/12   Bradd Canary, MD    Past Medical History: Past Medical History  Diagnosis Date  . Coronary artery disease     s/p MI X 2, REDO CABG 2003, MYOVIEW 12/09 EF 28% , EXTENSIVE ANTERIOR, INFERIOR AND LATERAL INFARCT. VERY MILD ANTERIOR ISCHEMIA  . CHF (congestive heart failure)     SECONDARY TO ISCHEMIC CARDIOMYOPATHY ; EF 28% BY MYOVIEW ; 50% BY ECHO 07/2008; ECHO 09/2009 EF "MILD TO MODERATELY REDUCED"  . Hypertension   . Hyperlipidemia   . Dizziness     CHRONIC; s/p EXTENSIVE EVAL, s/p EVAL @ [facility]; HEADACHE:  s/p EXTENSIVE EVAL; ON ELAVIL  . Sleep apnea 03/31/10    UNABLE TO TOLERATE CPAP; > HOME SLEEP TEST AHI 15.6  . Obesity   . Dupuytren contracture   . Nephrolithiasis   . CRI (chronic renal insufficiency)     BASELINE Cr. 1.7 ----DR. MATTINGLY  . Carotid artery occlusion     0-39% BILATERALLY W/CHRONICALLY OCCLUDED L VERTEBRAL  . Angina   . ICD (implantable cardiac defibrillator) in place   . Pacemaker   . PE (pulmonary embolism) 2005  . Myocardial infarction     x3  . Diabetes mellitus without complication   . GERD (gastroesophageal reflux disease)   . Arthritis     Past Surgical History: Past Surgical History  Procedure Laterality Date  . Insert / replace / remove pacemaker  2006    s/p GUIDANT IMPLANTABLE CARDIVERTER DEFIBRILLATOR  . Cataract  extraction    . Tonsillectomy      AS A CHILD  . Kidney stone surgery    . Coronary artery bypass graft  1995     AND 2003  . Eye surgery    . Pacemaker lead removal  08/28/2012    Procedure: PACEMAKER LEAD REMOVAL;  Surgeon: Marinus Maw, MD;  Location: [facility] OR;  Service: Cardiovascular;  Laterality: N/A;    Family History: No family history on file.  Social History: History   Social History  . Marital Status: Divorced  Spouse Name: N/A    Number of Children: N/A  . Years of Education: N/A   Occupational History  . RETIRED     WORKS PART TIME   Social History Main Topics  . Smoking status: Former Smoker    Types: Pipe    Quit date: 09/25/1993  . Smokeless tobacco: Never Used  . Alcohol Use: 3.5 oz/week    7 drink(s) per week     Comment: 1 DRINK PER DAY  . Drug Use: No  . Sexually Active: Not on file   Other Topics Concern  . Not on file   Social History Narrative   DIVORCED   LIVES BY HIMSELF   3 CHILDREN ONE IN    TOBACCO USE- QUIT (PIPE) 1995   ETOH 1 A DAY   NO EXERCISE         ICD-[employer].Marland KitchenMarland KitchenREMOTE YES             Allergies:  Allergies  Allergen Reactions  . Ezetimibe-Simvastatin     REACTION: myalgia: TOLERATES PRAVOCHOL OK    Objective:    Vital Signs:   Temp:  [97.3 F (36.3 C)] 97.3 F (36.3 C) (05/27 1533) Pulse Rate:  [96-104] 96 (05/27 1533) Resp:  [18] 18 (05/27 1533) BP: (127-134)/(77-78) 127/77 mmHg (05/27 1533) SpO2:  [91 %-96 %] 91 % (05/27 1533) Weight:  [96.843 kg (213 lb 8 oz)-98.793 kg (217 lb 12.8 oz)] 96.843 kg (213 lb 8 oz) (05/27 1533) Last BM Date: 02/18/13 Filed Weights   02/18/13 1533  Weight: 96.843 kg (213 lb 8 oz)    Physical Exam: General: Dyspneic on minimal exertion  HEENT: normal  Neck: supple. JVP 7 + prominent CV waves. Carotids 2+ bilaterally; no bruits. No lymphadenopathy or thryomegaly appreciated.  Cor: PMI normal. Tachy regular. No rubs, gallops or murmurs.  Lungs:  clear. Decreased at bases.  Abdomen: soft, nontender, distended. No hepatosplenomegaly. No bruits or masses. Good bowel sounds.  Extremities: no cyanosis, clubbing, rash, LLE 2+ RLE 1+ edema  Neuro: alert & orientedx3, cranial nerves grossly intact. Moves all 4 extremities w/o difficulty. Affect pleasant.  Skin: Chest keloid present central aspect.     Labs: Basic Metabolic Panel: No results found for this basename: NA, K, CL, CO2, GLUCOSE, BUN, CREATININE, CALCIUM, MG, PHOS,  in the last 168 hours  Liver Function Tests: No results found for this basename: AST, ALT, ALKPHOS, BILITOT, PROT, ALBUMIN,  in the last 168 hours No results found for this basename: LIPASE, AMYLASE,  in the last 168 hours No results found for this basename: AMMONIA,  in the last 168 hours  CBC: No results found for this basename: WBC, NEUTROABS, HGB, HCT, MCV, PLT,  in the last 168 hours  Cardiac Enzymes: No results found for this basename: CKTOTAL, CKMB, CKMBINDEX, TROPONINI,  in the last 168 hours  BNP: BNP (last 3 results) No results found for this basename: PROBNP,  in the last 8760 hours  CBG: No results found for this basename: GLUCAP,  in the last 168 hours  Coagulation Studies: No results found for this basename: LABPROT, INR,  in the last 72 hours  Other results: EKG: SR 96. 1AVB 216 ms. Inferior Qs. Probable anteroseptal Qs.  No ST-T wave abnormalities. Low volts (No sig change 1/14)   Imaging:  No results found.      Assessment:   1. Dyspnea, acute 2. A/c systolic and diastolic HF 3. H/o PE - coumadin stopped several months ago 4. Chronic renal  failure, stage IV 5. CAD s/p CABG 6. OSA    Plan/Discussion:    He has severe dyspnea with minimal activity. Volume status appears only mildly elevated. Will admit for w/u of recurrent PE. Given renal failure he is not candidate for CT angio. Will order VQ. Start lovenox. Will also diurese gently and recheck echo. If VQ positive will  consider Xarelto though renal function may preclude this. Has done well on coumadin previously.   Length of Stay: 0 Arvilla Meres 02/18/2013, 4:03 PM  Advanced Heart Failure Team Pager [phone] (M-F; 7a - 4p)  Please contact [facility] for night-coverage after hours (4p -7a ) and weekends on [email or web address]   Addendum: D-dimer = 11.96. Worrisome for PE/DVT. Continue lovenox. VQ pending.  Truman Hayward 7:54 PM

## 2013-02-18 NOTE — Assessment & Plan Note (Signed)
He has severe exertional dyspnea with minimal activity. Volume status appears only mildly elevated. Will admit for w/u of recurrent PE. Given renal failure he is not candidate for CT angio. Will order VQ. Will also diurese gently and recheck echo. If VQ positive will consider Xarelto though renal function may preclude this. Has done well on coumadin previously.

## 2013-02-18 NOTE — Telephone Encounter (Signed)
FYI: Pt left a message stating that he is having labored breathing since the reduction of his blood thinner. Pt is going to see cardiologist today. I informed pt that if he needs any other assistance to give [place] a call back. Pt voiced understanding

## 2013-02-19 DIAGNOSIS — I2699 Other pulmonary embolism without acute cor pulmonale: Secondary | ICD-10-CM

## 2013-02-19 DIAGNOSIS — I359 Nonrheumatic aortic valve disorder, unspecified: Secondary | ICD-10-CM

## 2013-02-19 LAB — BASIC METABOLIC PANEL
BUN: 54 mg/dL — ABNORMAL HIGH (ref 6–23)
Chloride: 102 mEq/L (ref 96–112)
GFR calc Af Amer: 29 mL/min — ABNORMAL LOW (ref >90)
Glucose, Bld: 149 mg/dL — ABNORMAL HIGH (ref 70–99)
Potassium: 3.9 mEq/L (ref 3.5–5.1)

## 2013-02-19 MED ORDER — ENOXAPARIN SODIUM 150 MG/ML ~~LOC~~ SOLN
150.0000 mg | SUBCUTANEOUS | Status: DC
  Filled 2013-02-19: qty 1

## 2013-02-19 MED ORDER — ZOLPIDEM TARTRATE 5 MG PO TABS
10.0000 mg | ORAL_TABLET | Freq: Every evening | ORAL | Status: DC | PRN
  Administered 2013-02-19: 10 mg via ORAL
  Filled 2013-02-19: qty 2

## 2013-02-19 MED ORDER — WARFARIN - PHARMACIST DOSING INPATIENT
Freq: Every day | Status: DC
  Administered 2013-02-19: 18:00:00

## 2013-02-19 MED ORDER — ENOXAPARIN (LOVENOX) PATIENT EDUCATION KIT
PACK | Freq: Once | Status: DC
  Filled 2013-02-19: qty 1

## 2013-02-19 MED ORDER — ENOXAPARIN SODIUM 150 MG/ML ~~LOC~~ SOLN
150.0000 mg | SUBCUTANEOUS | Status: DC
  Administered 2013-02-19: 150 mg via SUBCUTANEOUS
  Filled 2013-02-19 (×2): qty 1

## 2013-02-19 MED ORDER — WARFARIN SODIUM 4 MG PO TABS
4.5000 mg | ORAL_TABLET | Freq: Once | ORAL | Status: AC
  Administered 2013-02-19: 4.5 mg via ORAL
  Filled 2013-02-19: qty 1

## 2013-02-19 NOTE — Progress Notes (Signed)
  Echocardiogram 2D Echocardiogram has been performed.  Joseph Hamilton 02/19/2013, 12:22 PM

## 2013-02-19 NOTE — Progress Notes (Signed)
Advanced Heart Failure Rounding Note   Subjective:    Joseph Hamilton is a very pleasant 72 year old male with a history of coronary artery disease, status post previous myocardial infarction, as well as redo coronary bypass grafting in 2003. He also has congestive heart failure related to an ischemic cardiomyopathy with significant recovery of LV function. Last EF 40-45% (5/13) The remainder of his medical history is notable for a history of pulmonary emboli in 2005, CRI baseline 2.0-2.5, hypertension, hyperlipidemia, OSA, and chronic dizziness/headaches, which have been evaluated extensively. ICD reached EOL in 12/13 and was extracted and not re-implanted.   Yesterday he was admitted from HF clinic with dyspnea after stopping coumadin.  VQ + bilateral PEs. Remains SOB with exertion. Edema improved with lasix.   ECHO pending.    Objective:   Weight Range:  Vital Signs:   Temp:  [97 F (36.1 C)-98.5 F (36.9 C)] 97.8 F (36.6 C) (05/28 1300) Pulse Rate:  [74-89] 89 (05/28 1300) Resp:  [17-20] 20 (05/28 1300) BP: (114-138)/(71-83) 119/79 mmHg (05/28 1300) SpO2:  [95 %-98 %] 98 % (05/28 1300) Weight:  [95.482 kg (210 lb 8 oz)] 95.482 kg (210 lb 8 oz) (05/28 0539) Last BM Date: 02/18/13  Weight change: Filed Weights   02/18/13 1533 02/19/13 0539  Weight: 96.843 kg (213 lb 8 oz) 95.482 kg (210 lb 8 oz)    Intake/Output:   Intake/Output Summary (Last 24 hours) at 02/19/13 1536 Last data filed at 02/19/13 1416  Gross per 24 hour  Intake    480 ml  Output   2152 ml  Net  -1672 ml    Physical Exam:  General: sitting in chair HEENT: normal  Neck: supple. JVP 7 + prominent CV waves. Carotids 2+ bilaterally; no bruits. No lymphadenopathy or thryomegaly appreciated.  Cor: PMI normal. regular. No rubs, gallops or murmurs.  Lungs: clear. Decreased at bases.  Abdomen: soft, nontender, distended. No hepatosplenomegaly. No bruits or masses. Good bowel sounds.  Extremities: no cyanosis,  clubbing, rash, trace edema  Neuro: alert & orientedx3, cranial nerves grossly intact. Moves all 4 extremities w/o difficulty. Affect pleasant.  Skin: Chest keloid present central aspect.    Labs: Basic Metabolic Panel:  Recent Labs Lab 02/18/13 1625 02/19/13 0505  NA 139 140  K 4.8 3.9  CL 102 102  CO2 23 25  GLUCOSE 127* 149*  BUN 56* 54*  CREATININE 2.53* 2.46*  CALCIUM 9.9 9.6    Liver Function Tests: No results found for this basename: AST, ALT, ALKPHOS, BILITOT, PROT, ALBUMIN,  in the last 168 hours No results found for this basename: LIPASE, AMYLASE,  in the last 168 hours No results found for this basename: AMMONIA,  in the last 168 hours  CBC:  Recent Labs Lab 02/18/13 1625  WBC 7.9  NEUTROABS 5.2  HGB 12.9*  HCT 38.4*  MCV 87.5  PLT 173    Cardiac Enzymes: No results found for this basename: CKTOTAL, CKMB, CKMBINDEX, TROPONINI,  in the last 168 hours  BNP: BNP (last 3 results)  Recent Labs  02/18/13 1625  PROBNP 1092.0*     Other results:    Imaging: Dg Chest 1 View  02/18/2013   *RADIOLOGY REPORT*  Clinical Data: Pulmonary embolism.  Short of breath  CHEST - 1 VIEW  Comparison: Plain film 08/29/2012  Findings: Sternal wires overlie stable enlarged heart silhouette. No effusion, infiltrate, or pneumothorax.  IMPRESSION: Cardiomegaly without acute cardiopulmonary process.   Original Report Authenticated By: Genevive Bi, M.D.  Nm Pulmonary Perf And Vent  02/18/2013   *RADIOLOGY REPORT*  Clinical Data:  Short of breath, dyspnea concern for pulmonary embolism  NUCLEAR MEDICINE VENTILATION - PERFUSION LUNG SCAN  Technique:  Ventilation images were obtained in multiple projections using inhaled aerosol technetium 99 M DTPA.  Perfusion images were obtained in multiple projections after intravenous injection of Tc-15m MAA.  Radiopharmaceuticals:  Tc-60m DTPA aerosol and 5.8 mCi Tc-101m MAA.  Comparison: 02/18/2013  Findings:  Ventilation:   There is decreased ventilation to the left lower lobe likely related to the enlarged cardiac silhouette.  There are minor ventilation defects present.  Perfusion:  There are several wedge shaped peripheral perfusion defects concerning for pulmonary embolism.  Some these defects are matched however they are more prominent on the perfusion exam. Defects included wedge-shaped defect within the right middle lobe and right lower lobe.  Wedge shaped peripheral perfusion defect in the superior segment of the left lower lobe  as well as the lingula.  IMPRESSION: Bilateral wedge shaped peripheral perfusion defects primarily in the lower lobes concerning for pulmonary embolism.  High probability.  Findings conveyed to patient's nurse Rashita on 02/18/2013 at 2040 hours   Original Report Authenticated By: Genevive Bi, M.D.     Medications:     Scheduled Medications: . amitriptyline  25 mg Oral QHS  . aspirin EC  325 mg Oral Daily  . carvedilol  12.5 mg Oral q1800  . carvedilol  25 mg Oral q morning - 10a  . enoxaparin (LOVENOX) injection  150 mg Subcutaneous Q24H  . isosorbide mononitrate  60 mg Oral Daily  . nateglinide  60 mg Oral BID WC  . pantoprazole  40 mg Oral Daily  . ramipril  5 mg Oral Daily  . rosuvastatin  40 mg Oral QHS  . sodium chloride  3 mL Intravenous Q12H  . warfarin  4.5 mg Oral ONCE-1800  . Warfarin - Pharmacist Dosing Inpatient   Does not apply q1800    Infusions:    PRN Medications: sodium chloride, acetaminophen, ondansetron (ZOFRAN) IV, sodium chloride, zolpidem   Assessment:  1. Recurrent bilateral PEs   VQ scan 02/18/13  2. A/c systolic and diastolic HF  3. H/o PE - coumadin stopped several months ago  4. Chronic renal failure, stage IV  Creatinine baseline 2.0-2.5 5. CAD s/p CABG  6. OSA  Plan/Discussion:    Discussed results of VQ with bilateral PEs. We also discussed possibility of using Xarelto vs coumadin. Given borderline GFR. Have chosen lovenox ->  coumadin. Will need INR >2 with 2-day overlap.   Check lower extremity dopplers today. ECHO pending.   Possibly home tomorrow with lovenox.   Length of Stay: 1 Advanced Heart Failure Team Pager [phone] (M-F; 7a - 4p)  Please contact [facility] for night-coverage after hours (4p -7a ) and weekends on [email or web address]

## 2013-02-19 NOTE — Progress Notes (Signed)
02/19/13 1550 Elmer Bales RN, MSN CM Met with patient to discuss discharge needs.  Pt is declining home health services for CHF and Lovenox administration.  He states he has had cardiac rehab x3 and has given his own Lovenox multiple times in the past.

## 2013-02-19 NOTE — Progress Notes (Signed)
*  PRELIMINARY RESULTS* Vascular Ultrasound Lower extremity venous duplex has been completed.  Preliminary findings: Right = Evidence of partial, subacute DVT involving the popliteal vein. Evidence of occlusive, acute DVT involving the peroneal and posterior tibial veins.  Farrel Demark, RDMS, RVT  02/19/2013, 12:05 PM

## 2013-02-19 NOTE — Progress Notes (Signed)
ANTICOAGULATION CONSULT NOTE - Initial Consult  Pharmacy Consult for Lovenox/Coumadin Indication: new PE  Allergies  Allergen Reactions  . Ezetimibe-Simvastatin     REACTION: myalgia: TOLERATES PRAVOCHOL OK    Patient Measurements: Height: 5\' 3"  (160 cm) Weight: 210 lb 8 oz (95.482 kg) (c scale) IBW/kg (Calculated) : 56.9   Medical History: Past Medical History  Diagnosis Date  . Coronary artery disease     s/p MI X 2, REDO CABG 2003, MYOVIEW 12/09 EF 28% , EXTENSIVE ANTERIOR, INFERIOR AND LATERAL INFARCT. VERY MILD ANTERIOR ISCHEMIA  . CHF (congestive heart failure)     SECONDARY TO ISCHEMIC CARDIOMYOPATHY ; EF 28% BY MYOVIEW ; 50% BY ECHO 07/2008; ECHO 09/2009 EF "MILD TO MODERATELY REDUCED"  . Hypertension   . Hyperlipidemia   . Dizziness     CHRONIC; s/p EXTENSIVE EVAL, s/p EVAL @ [facility]; HEADACHE:  s/p EXTENSIVE EVAL; ON ELAVIL  . Sleep apnea 03/31/10    UNABLE TO TOLERATE CPAP; > HOME SLEEP TEST AHI 15.6  . Obesity   . Dupuytren contracture   . Nephrolithiasis   . CRI (chronic renal insufficiency)     BASELINE Cr. 1.7 ----DR. MATTINGLY  . Carotid artery occlusion     0-39% BILATERALLY W/CHRONICALLY OCCLUDED L VERTEBRAL  . Angina   . ICD (implantable cardiac defibrillator) in place   . Pacemaker   . PE (pulmonary embolism) 2005  . Myocardial infarction     x3  . Diabetes mellitus without complication   . GERD (gastroesophageal reflux disease)   . Arthritis     Assessment: 72 year old male with a history of CAD, s/p MI as well as redo CABG in 2003.  Also, has CHF with last EF of 40 -45% in May of 2013.  He has a history of PE in 2005 for which anticoagulants were recently stopped in April of this year.  VQ scan revealed high probability for PE.  Pharmacy asked to resume Coumadin.  Patient previously followed by Coumadin Clinic at [facility].  Previous dosage was 3 mg daily except for 4.5 mg on Tues and Thurs.  INR was therapeutic on this dosage.  Will need to  continue Lovenox for 5 days, and needs 2 therapeutic INRs (24 hrs apart)  before Lovenox can be stopped.  Patient agreeable to giving himself Lovenox injections, says he's done it in the past.  Due to new PE burden and borderline renal function, would like to continue him on "full-dose" lovenox.  Reasonable candidate for 1.5 mg/kg q 24 hrs.  Goal of Therapy:  INR 2-3 Monitor platelets by anticoagulation protocol: Yes   Plan:  1) Increase Lovenox to 150 mg q 24 hrs. 2) Coumadin 4.5 mg x 1 tonight.  Would recommend Coumadin 4.5 mg daily until f/u with Coumadin clinic as outpatient.  Hopefully could schedule OP INR check on Friday or Monday. 3) Continue daily INR. 4) CBC q 72 hrs while on Lovenox.  Tad Moore, BCPS  Clinical Pharmacist Pager [phone]  02/19/2013 10:42 AM

## 2013-02-20 DIAGNOSIS — I82409 Acute embolism and thrombosis of unspecified deep veins of unspecified lower extremity: Secondary | ICD-10-CM

## 2013-02-20 DIAGNOSIS — I82A11 Acute embolism and thrombosis of right axillary vein: Secondary | ICD-10-CM

## 2013-02-20 LAB — BASIC METABOLIC PANEL
BUN: 60 mg/dL — ABNORMAL HIGH (ref 6–23)
CO2: 20 mEq/L (ref 19–32)
Chloride: 100 mEq/L (ref 96–112)
Creatinine, Ser: 2.54 mg/dL — ABNORMAL HIGH (ref 0.50–1.35)

## 2013-02-20 LAB — PROTIME-INR: Prothrombin Time: 14.1 seconds (ref 11.6–15.2)

## 2013-02-20 MED ORDER — WARFARIN SODIUM 3 MG PO TABS: 4.5000 mg | ORAL_TABLET | Freq: Every day | ORAL | Status: DC

## 2013-02-20 MED ORDER — ENOXAPARIN SODIUM 150 MG/ML ~~LOC~~ SOLN: 150.0000 mg | Freq: Every day | SUBCUTANEOUS | Status: DC

## 2013-02-20 MED ORDER — FUROSEMIDE 40 MG PO TABS
40.0000 mg | ORAL_TABLET | Freq: Every day | ORAL | Status: DC
  Administered 2013-02-20: 40 mg via ORAL
  Filled 2013-02-20: qty 1

## 2013-02-20 NOTE — Progress Notes (Signed)
Advanced Heart Failure Rounding Note   Subjective:    [name] is a very pleasant 72 year old male with a history of coronary artery disease, status post previous myocardial infarction, as well as redo coronary bypass grafting in 2003. He also has congestive heart failure related to an ischemic cardiomyopathy with significant recovery of LV function. Last EF 40-45% (5/13) The remainder of his medical history is notable for a history of pulmonary emboli in 2005, CRI baseline 2.0-2.5, hypertension, hyperlipidemia, OSA, and chronic dizziness/headaches, which have been evaluated extensively. ICD reached EOL in 12/13 and was extracted and not re-implanted.   Admitted from HF clinic with dyspnea after stopping coumadin.  VQ + bilateral PEs. DVT noted RLE popliteal/tibial   Yesterday he started on coumadin. Continues to complain of dyspnea with exertion. None at rest  ECHO EF 30-35%. No RV strain    Objective:   Weight Range:  Vital Signs:   Temp:  [97 F (36.1 C)-97.8 F (36.6 C)] 97.3 F (36.3 C) (05/29 0546) Pulse Rate:  [71-89] 71 (05/29 0546) Resp:  [18-20] 19 (05/29 0546) BP: (111-137)/(68-79) 123/72 mmHg (05/29 0546) SpO2:  [98 %-100 %] 98 % (05/29 0546) Weight:  [95.9 kg (211 lb 6.7 oz)] 95.9 kg (211 lb 6.7 oz) (05/29 0546) Last BM Date: 02/19/13  Weight change: Filed Weights   02/18/13 1533 02/19/13 0539 02/20/13 0546  Weight: 96.843 kg (213 lb 8 oz) 95.482 kg (210 lb 8 oz) 95.9 kg (211 lb 6.7 oz)    Intake/Output:   Intake/Output Summary (Last 24 hours) at 02/20/13 0852 Last data filed at 02/20/13 0549  Gross per 24 hour  Intake    723 ml  Output   1277 ml  Net   -554 ml    Physical Exam:  General: sitting in chair HEENT: normal  Neck: supple. JVP 7 + prominent CV waves. Carotids 2+ bilaterally; no bruits. No lymphadenopathy or thryomegaly appreciated.  Cor: PMI normal. regular. No rubs, gallops or murmurs.  Lungs: clear. Decreased at bases.  Abdomen: soft,  nontender, distended. No hepatosplenomegaly. No bruits or masses. Good bowel sounds.  Extremities: no cyanosis, clubbing, rash, trace edema  Neuro: alert & orientedx3, cranial nerves grossly intact. Moves all 4 extremities w/o difficulty. Affect pleasant.  Skin: Chest keloid present central aspect.    Labs: Basic Metabolic Panel:  Recent Labs Lab 02/18/13 1625 02/19/13 0505 02/20/13 0515  NA 139 140 137  K 4.8 3.9 4.0  CL 102 102 100  CO2 23 25 20   GLUCOSE 127* 149* 124*  BUN 56* 54* 60*  CREATININE 2.53* 2.46* 2.54*  CALCIUM 9.9 9.6 9.2    Liver Function Tests: No results found for this basename: AST, ALT, ALKPHOS, BILITOT, PROT, ALBUMIN,  in the last 168 hours No results found for this basename: LIPASE, AMYLASE,  in the last 168 hours No results found for this basename: AMMONIA,  in the last 168 hours  CBC:  Recent Labs Lab 02/18/13 1625  WBC 7.9  NEUTROABS 5.2  HGB 12.9*  HCT 38.4*  MCV 87.5  PLT 173    Cardiac Enzymes: No results found for this basename: CKTOTAL, CKMB, CKMBINDEX, TROPONINI,  in the last 168 hours  BNP: BNP (last 3 results)  Recent Labs  02/18/13 1625  PROBNP 1092.0*     Other results:    Imaging: Dg Chest 1 View  02/18/2013   *RADIOLOGY REPORT*  Clinical Data: Pulmonary embolism.  Short of breath  CHEST - 1 VIEW  Comparison: Plain  film 08/29/2012  Findings: Sternal wires overlie stable enlarged heart silhouette. No effusion, infiltrate, or pneumothorax.  IMPRESSION: Cardiomegaly without acute cardiopulmonary process.   Original Report Authenticated By: Genevive Bi, M.D.   Nm Pulmonary Perf And Vent  02/18/2013   *RADIOLOGY REPORT*  Clinical Data:  Short of breath, dyspnea concern for pulmonary embolism  NUCLEAR MEDICINE VENTILATION - PERFUSION LUNG SCAN  Technique:  Ventilation images were obtained in multiple projections using inhaled aerosol technetium 99 M DTPA.  Perfusion images were obtained in multiple projections after  intravenous injection of Tc-48m MAA.  Radiopharmaceuticals:  Tc-32m DTPA aerosol and 5.8 mCi Tc-87m MAA.  Comparison: 02/18/2013  Findings:  Ventilation:  There is decreased ventilation to the left lower lobe likely related to the enlarged cardiac silhouette.  There are minor ventilation defects present.  Perfusion:  There are several wedge shaped peripheral perfusion defects concerning for pulmonary embolism.  Some these defects are matched however they are more prominent on the perfusion exam. Defects included wedge-shaped defect within the right middle lobe and right lower lobe.  Wedge shaped peripheral perfusion defect in the superior segment of the left lower lobe  as well as the lingula.  IMPRESSION: Bilateral wedge shaped peripheral perfusion defects primarily in the lower lobes concerning for pulmonary embolism.  High probability.  Findings conveyed to patient's nurse Rashita on 02/18/2013 at 2040 hours   Original Report Authenticated By: Genevive Bi, M.D.     Medications:     Scheduled Medications: . amitriptyline  25 mg Oral QHS  . aspirin EC  325 mg Oral Daily  . carvedilol  12.5 mg Oral q1800  . carvedilol  25 mg Oral q morning - 10a  . enoxaparin (LOVENOX) injection  150 mg Subcutaneous Q24H  . enoxaparin   Does not apply Once  . furosemide  40 mg Oral Daily  . isosorbide mononitrate  60 mg Oral Daily  . nateglinide  60 mg Oral BID WC  . pantoprazole  40 mg Oral Daily  . ramipril  5 mg Oral Daily  . rosuvastatin  40 mg Oral QHS  . sodium chloride  3 mL Intravenous Q12H  . Warfarin - Pharmacist Dosing Inpatient   Does not apply q1800    Infusions:    PRN Medications: sodium chloride, acetaminophen, ondansetron (ZOFRAN) IV, sodium chloride, zolpidem   Assessment:  1. Recurrent bilateral PEs   VQ scan 02/18/13  2. A/c systolic and diastolic HF  3. H/o PE - coumadin stopped several months ago  4. Chronic renal failure, stage IV  Creatinine baseline 2.0-2.5 5. CAD  s/p CABG  6. OSA 7. RLE DVT  Plan/Discussion:   Volume status mildly elevated. Restart po lasix today. Renal function stable.  He will continue to bel followed closley in the HF clinic.   Have chosen lovenox -> coumadin. Will need INR >2 with 2-day overlap.Lovenox eduction completed. INR pending. Follow up [facility] on Friday.    Home today with lovenox and coumadin. Will walk this am to make sure no O2 requirement.   Dawnette Mione,MD 8:54 AM    Length of Stay: 2 Advanced Heart Failure Team Pager [phone] (M-F; 7a - 4p)  Please contact [facility] for night-coverage after hours (4p -7a ) and weekends on [email or web address]

## 2013-02-20 NOTE — Progress Notes (Signed)
ANTICOAGULATION CONSULT NOTE - Follow-up Consult  Pharmacy Consult for Lovenox/Coumadin Indication: new PE  Allergies  Allergen Reactions  . Ezetimibe-Simvastatin     REACTION: myalgia: TOLERATES PRAVOCHOL OK    Patient Measurements: Height: 5\' 3"  (160 cm) Weight: 211 lb 6.7 oz (95.9 kg) (Scale C) IBW/kg (Calculated) : 56.9   Medical History: Past Medical History  Diagnosis Date  . Coronary artery disease     s/p MI X 2, REDO CABG 2003, MYOVIEW 12/09 EF 28% , EXTENSIVE ANTERIOR, INFERIOR AND LATERAL INFARCT. VERY MILD ANTERIOR ISCHEMIA  . CHF (congestive heart failure)     SECONDARY TO ISCHEMIC CARDIOMYOPATHY ; EF 28% BY MYOVIEW ; 50% BY ECHO 07/2008; ECHO 09/2009 EF "MILD TO MODERATELY REDUCED"  . Hypertension   . Hyperlipidemia   . Dizziness     CHRONIC; s/p EXTENSIVE EVAL, s/p EVAL @ [facility]; HEADACHE:  s/p EXTENSIVE EVAL; ON ELAVIL  . Sleep apnea 03/31/10    UNABLE TO TOLERATE CPAP; > HOME SLEEP TEST AHI 15.6  . Obesity   . Dupuytren contracture   . Nephrolithiasis   . CRI (chronic renal insufficiency)     BASELINE Cr. 1.7 ----DR. MATTINGLY  . Carotid artery occlusion     0-39% BILATERALLY W/CHRONICALLY OCCLUDED L VERTEBRAL  . Angina   . ICD (implantable cardiac defibrillator) in place   . Pacemaker   . PE (pulmonary embolism) 2005  . Myocardial infarction     x3  . Diabetes mellitus without complication   . GERD (gastroesophageal reflux disease)   . Arthritis     Assessment: 72 year old male with a history of CAD, s/p MI as well as redo CABG in 2003.  Also, has CHF with last EF of 40 -45% in May of 2013.  He has a history of PE in 2005 for which anticoagulants were recently stopped in April of this year.  Today's INR still at baseline as expected s/p 1 dose of Coumadin.  VQ scan revealed high probability for PE.  Pharmacy asked to resume Coumadin.  Patient previously followed [facility].  Previous dosage was 3 mg daily except for 4.5 mg on Tues  and Thurs.  INR was therapeutic on this dosage.  Will need to continue Lovenox for 5 days, and needs 2 therapeutic INRs (24 hrs apart)  before Lovenox can be stopped.  Patient agreeable to giving himself Lovenox injections, says he's done it in the past.  Due to new PE burden and borderline renal function, would like to continue him on "full-dose" lovenox.  Reasonable candidate for 1.5 mg/kg q 24 hrs.  Goal of Therapy:  INR 2-3 Monitor platelets by anticoagulation protocol: Yes   Plan:  1) Increase Lovenox to 150 mg q 24 hrs. 2) Recommend Coumadin 4.5 mg x 1 tonight at home.  Has f/u appt with [facility] [place] [facility] scheduled for tomorrow. 3) Continue daily INR. 4) CBC q 72 hrs while on Lovenox.  Tad Moore, BCPS  Clinical Pharmacist Pager [phone]  02/20/2013 9:13 AM

## 2013-02-20 NOTE — Progress Notes (Signed)
IV d/c'd.  Tele d/c'd.  Pt d/c'd to home.  Home meds and d/c instructions have been discussed with pt.  Pt denies any questions or concerns.  Pt leaving unit via wheelchair and appears in no acute distress. Nino Glow RN

## 2013-02-21 ENCOUNTER — Ambulatory Visit (INDEPENDENT_AMBULATORY_CARE_PROVIDER_SITE_OTHER): Payer: Medicare Other | Admitting: *Deleted

## 2013-02-21 DIAGNOSIS — I82A19 Acute embolism and thrombosis of unspecified axillary vein: Secondary | ICD-10-CM

## 2013-02-21 DIAGNOSIS — I2699 Other pulmonary embolism without acute cor pulmonale: Secondary | ICD-10-CM

## 2013-02-21 DIAGNOSIS — I82A11 Acute embolism and thrombosis of right axillary vein: Secondary | ICD-10-CM

## 2013-02-21 LAB — POCT INR: INR: 1.2

## 2013-02-22 ENCOUNTER — Other Ambulatory Visit: Payer: Self-pay | Admitting: Cardiology

## 2013-02-22 ENCOUNTER — Telehealth: Payer: Self-pay | Admitting: Physician Assistant

## 2013-02-22 NOTE — Telephone Encounter (Addendum)
Mr. Dickerman called in on Saturday asking if he can get oxygen ordered for at night. He states that ever since we was diagnosed with the blood clots in his lungs, he occasionally has restless nights where he feels like he can't quite catch his breath. He does not have to prop up and has no other acute symptoms. Per DC summary, O2 saturation was adequate >90% on day of discharge on RA with ambulation. I explained to the patient that we cannot just send in an order for oxygen without documenting low O2, and that we need these measurements to guide how much to order. I told him that if the SOB is new/worsening he can return to the ER for evaluation and management, but if he feels like it has remained stable since dx of the PE, he should call Dr. Prescott Gum office on Monday to discuss arrangement of overnight oximetry. He verbalized understanding and gratitude. Also has f/u appt on Tues with DB. Ronie Spies PA-C  Addendum: Care manager called Dr. Diona Browner and discussed case. He gave order for him to get OP overnight oximetry with results to Dr.Bensimhon. Terra Aveni PA-C

## 2013-02-22 NOTE — Progress Notes (Signed)
   CARE MANAGEMENT NOTE 02/22/2013  Patient:  Joseph Hamilton, Joseph Hamilton   Account Number:  [id number]  Date Initiated:  02/19/2013  Documentation initiated by:  Elmer Bales  Subjective/Objective Assessment:   Pt admitted with CHF, bilateral PE  Pt lives at home alone, independent.  Supportive daughter.     Action/Plan:   Discharge planning, home health RN   Anticipated DC Date:  02/21/2013   Anticipated DC Plan:  HOME/SELF CARE      DC Planning Services  CM consult      Choice offered to / List presented to:  C-1 Patient        HH arranged  HH-1 RN      [facility] agency  [facility]   Status of service:  Completed, signed off Medicare Important Message given?   (If response is "NO", the following Medicare IM given date fields will be blank) Date Medicare IM given:   Date Additional Medicare IM given:    Discharge Disposition:  HOME/SELF CARE  Per UR Regulation:  Reviewed for med. necessity/level of care/duration of stay  If discussed at Long Length of Stay Meetings, dates discussed:    Comments:  02/22/2013 1530 NCM contacted pt and provided information about [facility] agency. Isidoro Donning RN CCM Case Mgmt phone [phone]  02/22/2013 1300 NCM spoke to oncall MD, Dr Diona Browner. Orders received for [facility] RN and overnight pulse oximetry. Contacted [facility] and can do soc on 6/1. Isidoro Donning RN CCM Case Mgmt phone [phone]  02/22/2013 1200 Received call from pt and states he needs oxygen at night. States he did call the oncall MD to follow up. Pt is agreeable to follow up at home. Explained the importance of seeking medical attention if symptoms worsen. NCM explained will follow up with MD oncall for orders. Isidoro Donning RN CCM Case Mgmt phone [phone]  02/19/13 1550 Elmer Bales RN, MSN CM Met with patient to discuss discharge needs.  Pt is declining home health services for CHF and Lovenox administration.  He states he has had cardiac rehab x3 and has given his own Lovenox multiple  times in the past.

## 2013-02-23 NOTE — Progress Notes (Signed)
   CARE MANAGEMENT NOTE 02/23/2013  Patient:  PENG, THORSTENSON   Account Number:  [id number]  Date Initiated:  02/19/2013  Documentation initiated by:  Elmer Bales  Subjective/Objective Assessment:   Pt admitted with CHF, bilateral PE  Pt lives at home alone, independent.  Supportive daughter.     Action/Plan:   Discharge planning, home health RN   Anticipated DC Date:  02/21/2013   Anticipated DC Plan:  HOME/SELF CARE      DC Planning Services  CM consult      Choice offered to / List presented to:  C-1 Patient        HH arranged  HH-1 RN      [facility] agency  [facility]   Status of service:  Completed, signed off Medicare Important Message given?   (If response is "NO", the following Medicare IM given date fields will be blank) Date Medicare IM given:   Date Additional Medicare IM given:    Discharge Disposition:  HOME/SELF CARE  Per UR Regulation:  Reviewed for med. necessity/level of care/duration of stay  If discussed at Long Length of Stay Meetings, dates discussed:    Comments:  02/23/2013 1400 NCM received call from [facility] and pt refusing [facility] RN but agreeable to overnight pulse oximetry. Isidoro Donning RN CCM Case Mgmt phone [phone]  02/22/2013 1530 NCM contacted pt and provided information about [facility] agency. Isidoro Donning RN CCM Case Mgmt phone [phone]  02/22/2013 1300 NCM spoke to oncall MD, Dr Diona Browner. Orders received for [facility] RN and overnight pulse oximetry. Contacted [facility] and can do soc on 6/1. Isidoro Donning RN CCM Case Mgmt phone [phone]  02/22/2013 1200 Received call from pt and states he needs oxygen at night. States he did call the oncall MD to follow up. Pt is agreeable to follow up at home. Explained the importance of seeking medical attention if symptoms worsen. NCM explained will follow up with MD oncall for orders. Isidoro Donning RN CCM Case Mgmt phone [phone]  02/19/13 1550 Elmer Bales RN, MSN CM Met with patient to discuss  discharge needs.  Pt is declining home health services for CHF and Lovenox administration.  He states he has had cardiac rehab x3 and has given his own Lovenox multiple times in the past.

## 2013-02-24 ENCOUNTER — Telehealth: Payer: Self-pay | Admitting: *Deleted

## 2013-02-24 NOTE — Telephone Encounter (Signed)
Joseph Hamilton with [facility] left a message and is now agreeing that [facility] can come. Joseph Hamilton stated on vm that someone would be out there today or tomorrow

## 2013-02-24 NOTE — Telephone Encounter (Signed)
Office Message [address] [place], [place] [address] p. [phone] f. [phone] To: Lorrin Mais Fax: [phone] From: [facility] Date/ Time: 02/23/2013 11:34 AM Taken By: Patrick North, CSR Caller: Cordelia Pen Facility: not collected Patient: Staton, Markey DOB: 1941/08/18 Phone: [phone] Reason for Call: Cordelia Pen with [facility] called to advise patient has refused home health services .

## 2013-02-25 ENCOUNTER — Telehealth: Payer: Self-pay | Admitting: Internal Medicine

## 2013-02-25 ENCOUNTER — Other Ambulatory Visit (INDEPENDENT_AMBULATORY_CARE_PROVIDER_SITE_OTHER): Payer: Medicare Other

## 2013-02-25 ENCOUNTER — Ambulatory Visit (INDEPENDENT_AMBULATORY_CARE_PROVIDER_SITE_OTHER): Payer: Medicare Other | Admitting: *Deleted

## 2013-02-25 ENCOUNTER — Ambulatory Visit (HOSPITAL_COMMUNITY)
Admit: 2013-02-25 | Discharge: 2013-02-25 | Disposition: A | Discharge status: Home / Self Care | Payer: Medicare Other | Attending: Internal Medicine | Admitting: Internal Medicine

## 2013-02-25 VITALS — BP 100/64 | HR 61 | Wt 214.1 lb

## 2013-02-25 DIAGNOSIS — I129 Hypertensive chronic kidney disease with stage 1 through stage 4 chronic kidney disease, or unspecified chronic kidney disease: Secondary | ICD-10-CM | POA: Insufficient documentation

## 2013-02-25 DIAGNOSIS — G4733 Obstructive sleep apnea (adult) (pediatric): Secondary | ICD-10-CM | POA: Insufficient documentation

## 2013-02-25 DIAGNOSIS — I5043 Acute on chronic combined systolic (congestive) and diastolic (congestive) heart failure: Secondary | ICD-10-CM

## 2013-02-25 DIAGNOSIS — R0609 Other forms of dyspnea: Secondary | ICD-10-CM | POA: Insufficient documentation

## 2013-02-25 DIAGNOSIS — I2589 Other forms of chronic ischemic heart disease: Secondary | ICD-10-CM | POA: Insufficient documentation

## 2013-02-25 DIAGNOSIS — K219 Gastro-esophageal reflux disease without esophagitis: Secondary | ICD-10-CM | POA: Insufficient documentation

## 2013-02-25 DIAGNOSIS — R0989 Other specified symptoms and signs involving the circulatory and respiratory systems: Secondary | ICD-10-CM | POA: Insufficient documentation

## 2013-02-25 DIAGNOSIS — R51 Headache: Secondary | ICD-10-CM | POA: Insufficient documentation

## 2013-02-25 DIAGNOSIS — Z951 Presence of aortocoronary bypass graft: Secondary | ICD-10-CM | POA: Insufficient documentation

## 2013-02-25 DIAGNOSIS — I5022 Chronic systolic (congestive) heart failure: Secondary | ICD-10-CM | POA: Insufficient documentation

## 2013-02-25 DIAGNOSIS — I2510 Atherosclerotic heart disease of native coronary artery without angina pectoris: Secondary | ICD-10-CM | POA: Insufficient documentation

## 2013-02-25 DIAGNOSIS — G4730 Sleep apnea, unspecified: Secondary | ICD-10-CM

## 2013-02-25 DIAGNOSIS — Z79899 Other long term (current) drug therapy: Secondary | ICD-10-CM | POA: Insufficient documentation

## 2013-02-25 DIAGNOSIS — I2699 Other pulmonary embolism without acute cor pulmonale: Secondary | ICD-10-CM

## 2013-02-25 DIAGNOSIS — I82401 Acute embolism and thrombosis of unspecified deep veins of right lower extremity: Secondary | ICD-10-CM

## 2013-02-25 DIAGNOSIS — I82409 Acute embolism and thrombosis of unspecified deep veins of unspecified lower extremity: Secondary | ICD-10-CM

## 2013-02-25 DIAGNOSIS — N189 Chronic kidney disease, unspecified: Secondary | ICD-10-CM | POA: Insufficient documentation

## 2013-02-25 DIAGNOSIS — E785 Hyperlipidemia, unspecified: Secondary | ICD-10-CM | POA: Insufficient documentation

## 2013-02-25 DIAGNOSIS — R42 Dizziness and giddiness: Secondary | ICD-10-CM | POA: Insufficient documentation

## 2013-02-25 DIAGNOSIS — Z86711 Personal history of pulmonary embolism: Secondary | ICD-10-CM | POA: Insufficient documentation

## 2013-02-25 DIAGNOSIS — I252 Old myocardial infarction: Secondary | ICD-10-CM | POA: Insufficient documentation

## 2013-02-25 DIAGNOSIS — R0602 Shortness of breath: Secondary | ICD-10-CM | POA: Insufficient documentation

## 2013-02-25 DIAGNOSIS — I82A19 Acute embolism and thrombosis of unspecified axillary vein: Secondary | ICD-10-CM

## 2013-02-25 DIAGNOSIS — I25810 Atherosclerosis of coronary artery bypass graft(s) without angina pectoris: Secondary | ICD-10-CM

## 2013-02-25 DIAGNOSIS — E119 Type 2 diabetes mellitus without complications: Secondary | ICD-10-CM | POA: Insufficient documentation

## 2013-02-25 DIAGNOSIS — I82A11 Acute embolism and thrombosis of right axillary vein: Secondary | ICD-10-CM

## 2013-02-25 DIAGNOSIS — Z95810 Presence of automatic (implantable) cardiac defibrillator: Secondary | ICD-10-CM | POA: Insufficient documentation

## 2013-02-25 LAB — BASIC METABOLIC PANEL
Chloride: 107 mEq/L (ref 96–112)
Creatinine, Ser: 2.2 mg/dL — ABNORMAL HIGH (ref 0.4–1.5)
Sodium: 140 mEq/L (ref 135–145)

## 2013-02-25 LAB — POCT INR: INR: 1.9

## 2013-02-25 MED ORDER — ZOLPIDEM TARTRATE ER 12.5 MG PO TBCR: 12.5000 mg | EXTENDED_RELEASE_TABLET | Freq: Every evening | ORAL | Status: DC | PRN

## 2013-02-25 MED ORDER — ASPIRIN EC 81 MG PO TBEC: 81.0000 mg | DELAYED_RELEASE_TABLET | Freq: Every day | ORAL | Status: DC

## 2013-02-25 NOTE — Assessment & Plan Note (Addendum)
Volume status mildly elevated. Continue current diuretic regimen. Reinforced need for daily weights and reviewed use of sliding scale diuretics. Check BMET today. Will continue meds at current doses. Watch renal function closely on ACE.

## 2013-02-25 NOTE — Telephone Encounter (Signed)
OK to refill Ambien CR 12.5 mg po qhs prn insomnia, Disp #30, 1 rf

## 2013-02-25 NOTE — Patient Instructions (Addendum)
Labs today at [facility] have been referred to Dr Craige Cotta  Follow up in 2 months with an ECHO  Do the following things EVERYDAY: 1) Weigh yourself in the morning before breakfast. Write it down and keep it in a log. 2) Take your medicines as prescribed 3) Eat low salt foods-Limit salt (sodium) to 2000 mg per day.  4) Stay as active as you can everyday 5) Limit all fluids for the day to less than 2 liters

## 2013-02-25 NOTE — Assessment & Plan Note (Signed)
Continue coumadin. Lovenox bridge until INR >2

## 2013-02-25 NOTE — Telephone Encounter (Signed)
Zolpidem refill Last OV 01-15-13 Med last filled 12-05-2012 #30 with 1 refill

## 2013-02-25 NOTE — Assessment & Plan Note (Addendum)
Discussed risks of untreated OSA. Refer back to Dr Craige Cotta for CPAP recommendations.

## 2013-02-25 NOTE — Telephone Encounter (Signed)
Refill- zolpidem tartrate 12.5mg 

## 2013-02-25 NOTE — Telephone Encounter (Signed)
Med filled.  

## 2013-02-25 NOTE — Assessment & Plan Note (Addendum)
Needs lifelong coumadin. Not NOAC candidate due to CRI.

## 2013-02-27 NOTE — Discharge Summary (Signed)
Advanced Heart Failure Team  Discharge Summary   Patient ID: Joseph Hamilton MRN: [id number], DOB/AGE: [birth year] 73 y.o. Admit date: 02/18/2013 D/C date:   02/20/2013   Primary Discharge Diagnoses:  1. Recurrent bilateral PEs VQ scan 02/18/13  2. A/c systolic and diastolic HF  3. RLE DVT popliteal  4. Chronic renal failure, stage IV Creatinine baseline 2.0-2.5  5. CAD s/p CABG  6. OSA   Hospital Course:  Mr. Carmer is a very pleasant 72 year old male with a history of coronary artery disease, status post previous myocardial infarction, as well as redo coronary bypass grafting in 2003. He also has congestive heart failure related to an ischemic cardiomyopathy with significant recovery of LV function. Last EF 40-45% (5/13) The remainder of his medical history is notable for a history of pulmonary emboli in 2005, CRI baseline 2.0-2.5, hypertension, hyperlipidemia, OSA, and chronic dizziness/headaches, which have been evaluated extensively. ICD reached EOL in 12/13 and was extracted and not re-implanted because EF was greater than 35%.    Admitted from HF clinic with severe exertional dyspnea. Anticoagulants stopped in April 2014 as PE > 67 years old. He was not a candidate for CT angiogram due to renal failure therefore he had VQ scan which revealed bilateral PEs. D-Dimer was 11.96. Lower extremity dopplers + for RLE DVT in popliteal vein. ECHO obtained with reduced EF down to 35% from previous EF 40-45% last year. RV normal with no evidence of strain. No candidate for Xarelto due to CRI. He was placed lovenox with bridge to coumadin. He will continue on Lovenox for 5 days with the goal being INR> 2. Prior to d/c lovenox education provided by staff RN. Follow up at [facility] 02/21/13.   On the day of discharge ambulated in hall with oxygen saturations > 90% on room air. Plan to repeat ECHO in 2-3 months.     Discharge Weight Range: Discharge Weight 211 pounds.   Discharge Vitals:  Blood pressure 130/83, pulse 84, temperature 97.3 F (36.3 C), temperature source Oral, resp. rate 19, height 5\' 3"  (1.6 m), weight 95.9 kg (211 lb 6.7 oz), SpO2 98.00%.  Labs: Lab Results  Component Value Date   WBC 7.9 02/18/2013   HGB 12.9* 02/18/2013   HCT 38.4* 02/18/2013   MCV 87.5 02/18/2013   PLT 173 02/18/2013     Recent Labs Lab 02/25/13 1120  NA 140  K 4.7  CL 107  CO2 25  BUN 48*  CREATININE 2.2*  CALCIUM 9.4  GLUCOSE 86   Lab Results  Component Value Date   CHOL 125 01/15/2013   HDL 30* 01/15/2013   LDLCALC 46 01/15/2013   TRIG 243* 01/15/2013   BNP (last 3 results)  Recent Labs  02/18/13 1625  PROBNP 1092.0*    Diagnostic Studies/Procedures   No results found.  Discharge Medications     Medication List    TAKE these medications       amitriptyline 25 MG tablet  Commonly known as:  ELAVIL  Take 1 tablet (25 mg total) by mouth at bedtime.     carvedilol 25 MG tablet  Commonly known as:  COREG  Take 12.5-25 mg by mouth 2 (two) times daily with a meal. 25mg  AM and 12.5mg  PM     CRESTOR 40 MG tablet  Generic drug:  rosuvastatin  TAKE ONE (1) TABLET EACH DAY     enoxaparin 150 MG/ML injection  Commonly known as:  LOVENOX  Inject 1 mL (150 mg total)  into the skin daily at 6 PM.     fish oil-omega-3 fatty acids 1000 MG capsule  Take 1 g by mouth daily.     furosemide 40 MG tablet  Commonly known as:  LASIX  Take 40 mg by mouth See admin instructions. Patient takes 1 tablet every day with permission to take additional tablet if needed.     isosorbide mononitrate 60 MG 24 hr tablet  Commonly known as:  IMDUR  Take 1 tablet (60 mg total) by mouth daily.     nateglinide 60 MG tablet  Commonly known as:  STARLIX  Take 1 tablet (60 mg total) by mouth 2 (two) times daily with a meal. Take with the two largest meals of the day     nitroGLYCERIN 0.4 MG SL tablet  Commonly known as:  NITROSTAT  Place 1 tablet (0.4 mg total) under the tongue every  5 (five) minutes x 3 doses as needed for chest pain.     omeprazole 40 MG capsule  Commonly known as:  PRILOSEC  Take 1 capsule (40 mg total) by mouth daily.     ramipril 5 MG capsule  Commonly known as:  ALTACE  Take 1 capsule (5 mg total) by mouth daily.     warfarin 3 MG tablet  Commonly known as:  COUMADIN  Take 1.5 tablets (4.5 mg total) by mouth daily.        Disposition   The patient will be discharged in stable condition to home. Discharge Orders   Future Appointments Provider Department Dept Phone   03/03/2013 11:45 AM [facility] [phone]   03/27/2013 3:45 PM Coralyn Helling, MD [facility] [phone]   Future Orders Complete By Expires     (HEART FAILURE PATIENTS) Call MD:  Anytime you have any of the following symptoms: 1) 3 pound weight gain in 24 hours or 5 pounds in 1 week 2) shortness of breath, with or without a dry hacking cough 3) swelling in the hands, feet or stomach 4) if you have to sleep on extra pillows at night in order to breathe.  As directed     ACE Inhibitor / ARB already ordered  As directed     Diet - low sodium heart healthy  As directed     Heart Failure patients record your daily weight using the same scale at the same time of day  As directed     Increase activity slowly  As directed       Follow-up Information   Follow up with Arvilla Meres, MD On 02/25/2013. (at  10:15 Garage Code [id number])    Contact information:   [address] [place] [address] [phone]       Follow up with [facility] On 02/21/2013. (at 11:30)    Contact information:   [address], [address] [place] [address] [phone]        Duration of Discharge Encounter: Greater than 35 minutes   Signed,  Truman Hayward 4:15 PM

## 2013-03-03 ENCOUNTER — Ambulatory Visit (INDEPENDENT_AMBULATORY_CARE_PROVIDER_SITE_OTHER): Payer: Medicare Other | Admitting: *Deleted

## 2013-03-03 DIAGNOSIS — I82401 Acute embolism and thrombosis of unspecified deep veins of right lower extremity: Secondary | ICD-10-CM

## 2013-03-03 DIAGNOSIS — I82A11 Acute embolism and thrombosis of right axillary vein: Secondary | ICD-10-CM

## 2013-03-03 DIAGNOSIS — I82A19 Acute embolism and thrombosis of unspecified axillary vein: Secondary | ICD-10-CM

## 2013-03-03 DIAGNOSIS — I82409 Acute embolism and thrombosis of unspecified deep veins of unspecified lower extremity: Secondary | ICD-10-CM

## 2013-03-03 DIAGNOSIS — I2699 Other pulmonary embolism without acute cor pulmonale: Secondary | ICD-10-CM

## 2013-03-03 LAB — POCT INR: INR: 2.3

## 2013-03-03 NOTE — Assessment & Plan Note (Signed)
No evidence of ischemia. Continue current regimen.   

## 2013-03-03 NOTE — Progress Notes (Signed)
Patient ID: Joseph Hamilton, male   DOB: Feb 20, 1941, 72 y.o.   MRN: [id number] Hematologist: Dr Myna Hidalgo PCP: Dr Ty Hilts Pulmonary: Dr Craige Cotta  HPI:  Joseph Hamilton is a very pleasant 72 year old male with a history of coronary artery disease, status post previous myocardial infarction, as well as redo coronary bypass grafting in 2003.  He also has chronic Systolic Heart Failure, ICM, Last EF 35% (02/18/13).   The remainder of his medical history is notable for a history of pulmonary emboli in 2005, CRI baseline 2.0-2.5, hypertension, hyperlipidemia, OSA, and chronic dizziness/headaches, which have been evaluated extensively.ICD reached EOL in 12/13 and was extracted and not re-implanted.   Last cath 02/11/12 for NSTEMI manifested as scapular pain  1. Severe 3 vessel obstructive coronary disease.  2. Patent saphenous vein graft to the distal RCA  3. Patent LIMA graft to the mid LAD supplying the second diagonal branch.  4. Other saphenous vein grafts are occluded. These are presumably the saphenous vein graft to the obtuse marginal vessel and what was called a ramus branch.   Felt to be no clear culprit or easily revascularizable lesions. If symptoms progressed Rotoblator of LCX felt to be high risk option.   He as was admitted to [facility] with acute PE 02/18/13 after coumadin stopped (remote PE > 9 years ago). D-Dimer 11.96. Lower extremity dopplers + DVT RLE popliteal vein. ECHO  EF 35% no evidence of RV strain. Discharged on Lovenox/coumadin until INR > 2.  He returns for post hospital follow up. Dyspnea improving but remains SOB going up steps and in the am. Denies orthopnea/PND. Falling a sleep during the day. Wants to pursue CPAP again. Intolerant of CPAP mask previously but interested in nasal pillows. Weight at home 215 pounds. as an acute work in. Completes lovenox today and will follow up at [facility] this am. GP cut back ramipril to 5 mg daily 2 months ago due to renal insuffiencey.     ROS:  All systems negative except as listed in HPI, PMH and Problem List.  Past Medical History  Diagnosis Date  . Coronary artery disease     s/p MI X 2, REDO CABG 2003, MYOVIEW 12/09 EF 28% , EXTENSIVE ANTERIOR, INFERIOR AND LATERAL INFARCT. VERY MILD ANTERIOR ISCHEMIA  . CHF (congestive heart failure)     SECONDARY TO ISCHEMIC CARDIOMYOPATHY ; EF 28% BY MYOVIEW ; 50% BY ECHO 07/2008; ECHO 09/2009 EF "MILD TO MODERATELY REDUCED"  . Hypertension   . Hyperlipidemia   . Dizziness     CHRONIC; s/p EXTENSIVE EVAL, s/p EVAL @ [facility]; HEADACHE:  s/p EXTENSIVE EVAL; ON ELAVIL  . Sleep apnea 03/31/10    UNABLE TO TOLERATE CPAP; > HOME SLEEP TEST AHI 15.6  . Obesity   . Dupuytren contracture   . Nephrolithiasis   . CRI (chronic renal insufficiency)     BASELINE Cr. 1.7 ----DR. MATTINGLY  . Carotid artery occlusion     0-39% BILATERALLY W/CHRONICALLY OCCLUDED L VERTEBRAL  . Angina   . ICD (implantable cardiac defibrillator) in place   . Pacemaker   . PE (pulmonary embolism) 2005  . Myocardial infarction     x3  . Diabetes mellitus without complication   . GERD (gastroesophageal reflux disease)   . Arthritis     Current Outpatient Prescriptions  Medication Sig Dispense Refill  . amitriptyline (ELAVIL) 25 MG tablet Take 1 tablet (25 mg total) by mouth at bedtime.  30 tablet  5  . carvedilol (COREG) 25  MG tablet Take 12.5-25 mg by mouth 2 (two) times daily with a meal. 25mg  AM and 12.5mg  PM      . CRESTOR 40 MG tablet TAKE ONE (1) TABLET EACH DAY  30 tablet  2  . fish oil-omega-3 fatty acids 1000 MG capsule Take 1 g by mouth daily.      . furosemide (LASIX) 40 MG tablet Take 40 mg by mouth See admin instructions. Patient takes 1 tablet every day with permission to take additional tablet if needed.      . isosorbide mononitrate (IMDUR) 60 MG 24 hr tablet Take 1 tablet (60 mg total) by mouth daily.  30 tablet  6  . nateglinide (STARLIX) 60 MG tablet Take 1 tablet (60 mg total) by mouth 2 (two)  times daily with a meal. Take with the two largest meals of the day  60 tablet  3  . omeprazole (PRILOSEC) 40 MG capsule Take 1 capsule (40 mg total) by mouth daily.  30 capsule  11  . ramipril (ALTACE) 5 MG capsule Take 1 capsule (5 mg total) by mouth daily.  30 capsule  5  . warfarin (COUMADIN) 3 MG tablet Take 1.5 tablets (4.5 mg total) by mouth daily.  30 tablet  6  . aspirin EC 81 MG tablet Take 1 tablet (81 mg total) by mouth daily.  30 tablet  3  . enoxaparin (LOVENOX) 150 MG/ML injection Inject 1 mL (150 mg total) into the skin daily at 6 PM.  5 Syringe  0  . nitroGLYCERIN (NITROSTAT) 0.4 MG SL tablet Place 1 tablet (0.4 mg total) under the tongue every 5 (five) minutes x 3 doses as needed for chest pain.  25 tablet  3  . zolpidem (AMBIEN CR) 12.5 MG CR tablet Take 1 tablet (12.5 mg total) by mouth at bedtime as needed for sleep.  30 tablet  1   No current facility-administered medications for this encounter.     PHYSICAL EXAM: Filed Vitals:   02/25/13 1003  BP: 100/64  Pulse: 61   Weight change:  General:  No acute distress HEENT: normal Neck: supple. JVP 5-6  Carotids 2+ bilaterally; no bruits. No lymphadenopathy or thryomegaly appreciated. Cor: PMI normal. Regular. No rubs, gallops or murmurs. Lungs: clear. Decreased at bases.  Abdomen: soft, nontender, distended. No hepatosplenomegaly. No bruits or masses. Good bowel sounds. Extremities: no cyanosis, clubbing, rash,  RLE 1+ edema Neuro: alert & orientedx3, cranial nerves grossly intact. Moves all 4 extremities w/o difficulty. Affect pleasant. Skin: Chest keloid present central aspect.    ASSESSMENT & PLAN:

## 2013-03-05 ENCOUNTER — Other Ambulatory Visit: Payer: Self-pay

## 2013-03-05 ENCOUNTER — Encounter: Payer: Self-pay | Admitting: Internal Medicine

## 2013-03-05 MED ORDER — CARVEDILOL 25 MG PO TABS: 12.5000 mg | ORAL_TABLET | Freq: Two times a day (BID) | ORAL | Status: DC

## 2013-03-14 ENCOUNTER — Other Ambulatory Visit: Payer: Self-pay | Admitting: Internal Medicine

## 2013-03-14 NOTE — Telephone Encounter (Signed)
Rx request to pharmacy/SLS  

## 2013-03-18 ENCOUNTER — Ambulatory Visit (INDEPENDENT_AMBULATORY_CARE_PROVIDER_SITE_OTHER): Payer: Medicare Other | Admitting: *Deleted

## 2013-03-18 ENCOUNTER — Telehealth (HOSPITAL_COMMUNITY): Payer: Self-pay | Admitting: *Deleted

## 2013-03-18 DIAGNOSIS — I82A11 Acute embolism and thrombosis of right axillary vein: Secondary | ICD-10-CM

## 2013-03-18 DIAGNOSIS — I82A19 Acute embolism and thrombosis of unspecified axillary vein: Secondary | ICD-10-CM

## 2013-03-18 DIAGNOSIS — I2699 Other pulmonary embolism without acute cor pulmonale: Secondary | ICD-10-CM

## 2013-03-18 DIAGNOSIS — G4730 Sleep apnea, unspecified: Secondary | ICD-10-CM

## 2013-03-18 LAB — POCT INR: INR: 4.1

## 2013-03-19 NOTE — Telephone Encounter (Signed)
Pt had overnight oximetry test, o2 sats were at 88% or below per Dr Joseph Hamilton have pt f/u with Dr Joseph Hamilton to discuss home o2 vs cpap, pt is aware and is sch to see Dr Joseph Hamilton 7/3 report faxed to his office

## 2013-03-27 ENCOUNTER — Encounter: Payer: Self-pay | Admitting: Pulmonary Disease

## 2013-03-27 ENCOUNTER — Ambulatory Visit (INDEPENDENT_AMBULATORY_CARE_PROVIDER_SITE_OTHER): Payer: Medicare Other | Admitting: Pulmonary Disease

## 2013-03-27 VITALS — BP 112/74 | HR 80 | Temp 97.9°F | Ht 70.0 in | Wt 215.0 lb

## 2013-03-27 DIAGNOSIS — G4730 Sleep apnea, unspecified: Secondary | ICD-10-CM

## 2013-03-27 DIAGNOSIS — G4700 Insomnia, unspecified: Secondary | ICD-10-CM

## 2013-03-27 NOTE — Assessment & Plan Note (Signed)
He is to remain on [place].  I don't think he would ever be able to come off sleep aide therapy.  Advised he can take ambien on the night of his sleep study.

## 2013-03-27 NOTE — Progress Notes (Signed)
Chief Complaint  Patient presents with  . Follow-up    Dr. Gala Romney wanted the pt to be seen for sleep issues. Also there is a concern that his O2 level is dropping while asleep.    History of Present Illness: Joseph Hamilton is a 72 y.o. male with OSA.  I last saw Joseph Hamilton in October 2011.  He was not able to tolerate CPAP mask.  He was only tried on full face mask >> he was told this is the only mask he could use.  As a result he stopped using CPAP in 2011.  He is followed by cardiology for CAD, systolic CHF and secondary pulmonary hypertension.  He had heart attack again in October 2013.  He had recurrent PE and DVT in May 2014.  He had ONO on room air in June which showed significant oxygen desaturation.  He was advised to return to pulmonary/sleep medicine to further assess for sleep disordered breathing.  He goes to bed at midnight.  He takes [place] every night since his first heart attack.  He lives alone and is not sure if he snores.  He will wake up feeling like he catches his breath.  He gets out of bed at 9 am.  He feels tired sometimes during the day.  He has trouble with allergies.  He gets winded with activity, but is able to exercise at the gym several days per week.  He denies chest pain, palpitations, cough, wheeze, or sputum.  He used to smoke a pipe.  He denies any history of asthma.  TESTS: Home sleep test 03/31/10 >> AHI 15.6 from 03/31/10 V/Q 02/18/13 >> b/l wedge shaped perfusion defects lower lobes, high probability. Doppler legs 02/19/13 >> subacute DVT Rt popliteal vein, Rt posterior tibial vein, Rt peroneal vein Echo 02/19/13 >> EF 30 to 35%, mild AI, PAS 42 mmHg. ONO with RA 03/06/13 >> Test time 7 hrs 44 min.  Baseline SpO2 93.4%, low SpO2 81%.  Spent 29 min (6.3%) with SpO2 < 88%.   Joseph Hamilton  has a past medical history of Coronary artery disease; CHF (congestive heart failure); Hypertension; Hyperlipidemia; Dizziness; Sleep apnea (03/31/10); Obesity; Dupuytren  contracture; Nephrolithiasis; CRI (chronic renal insufficiency); Carotid artery occlusion; Angina; ICD (implantable cardiac defibrillator) in place; Pacemaker; PE (pulmonary embolism) (2005); Myocardial infarction; Diabetes mellitus without complication; GERD (gastroesophageal reflux disease); and Arthritis.  Joseph Hamilton  has past surgical history that includes Insert / replace / remove pacemaker (2006); Cataract extraction; Tonsillectomy; Kidney stone surgery; Coronary artery bypass graft (1995); Eye surgery; and Pacemaker lead removal (08/28/2012).  Prior to Admission medications   Medication Sig Start Date End Date Taking? Authorizing Provider  amitriptyline (ELAVIL) 25 MG tablet Take 1 tablet (25 mg total) by mouth at bedtime. 10/14/12   Bradd Canary, MD  aspirin EC 81 MG tablet Take 1 tablet (81 mg total) by mouth daily. 02/25/13   Amy D Filbert Schilder, NP  carvedilol (COREG) 25 MG tablet Take 0.5-1 tablets (12.5-25 mg total) by mouth 2 (two) times daily with a meal. 25mg  AM and 12.5mg  PM 03/05/13   Dolores Patty, MD  CRESTOR 40 MG tablet TAKE ONE (1) TABLET EACH DAY 01/02/13   Bradd Canary, MD  enoxaparin (LOVENOX) 150 MG/ML injection Inject 1 mL (150 mg total) into the skin daily at 6 PM. 02/20/13   Amy D Clegg, NP  fish oil-omega-3 fatty acids 1000 MG capsule Take 1 g by mouth daily.    Historical Provider, MD  furosemide (LASIX) 40 MG tablet Take 40 mg by mouth See admin instructions. Patient takes 1 tablet every day with permission to take additional tablet if needed.    Historical Provider, MD  isosorbide mononitrate (IMDUR) 60 MG 24 hr tablet Take 1 tablet (60 mg total) by mouth daily. 02/07/13 02/07/14  Dolores Patty, MD  nateglinide (STARLIX) 60 MG tablet Take 1 tablet (60 mg total) by mouth 2 (two) times daily with a meal. Take with the two largest meals of the day 11/27/12   Bradd Canary, MD  nitroGLYCERIN (NITROSTAT) 0.4 MG SL tablet Place 1 tablet (0.4 mg total) under the tongue every 5  (five) minutes x 3 doses as needed for chest pain. 05/28/12 05/28/13  Dolores Patty, MD  omeprazole (PRILOSEC) 40 MG capsule TAKE ONE (1) CAPSULE EACH DAY 03/14/13   Bradd Canary, MD  ramipril (ALTACE) 5 MG capsule Take 1 capsule (5 mg total) by mouth daily. 01/24/13   Dolores Patty, MD  warfarin (COUMADIN) 3 MG tablet Take 1.5 tablets (4.5 mg total) by mouth daily. 02/20/13   Amy D Clegg, NP  zolpidem (AMBIEN CR) 12.5 MG CR tablet Take 1 tablet (12.5 mg total) by mouth at bedtime as needed for sleep. 02/25/13   Bradd Canary, MD    Allergies  Allergen Reactions  . Ezetimibe-Simvastatin     REACTION: myalgia: TOLERATES PRAVOCHOL OK     Physical Exam:  General - No distress ENT - No sinus tenderness, no oral exudate, no LAN Cardiac - s1s2 regular, no murmur Chest - No wheeze/rales/dullness Back - No focal tenderness Abd - Soft, non-tender Ext - No edema Neuro - Normal strength Skin - No rashes Psych - normal mood, and behavior   Assessment/Plan:  Coralyn Helling, MD [facility] Pager:  [phone]

## 2013-03-27 NOTE — Assessment & Plan Note (Signed)
He has prior history of sleep apnea, but had difficulty tolerating CPAP due to mask fit.  He has history of systolic heart failure.  He had recent overnight oximetry which showed significant oxygen desaturation.  I am concerned he still has sleep disordered breathing (OSA vs CSA vs hypoventilation).  To further assess will need in lab sleep study.  Depending on results he may need CPAP vs RAD vs supplemental oxygen.

## 2013-03-27 NOTE — Patient Instructions (Signed)
Will arrange for sleep study Will call to arrange for follow up after sleep study reviewed 

## 2013-04-01 ENCOUNTER — Ambulatory Visit (INDEPENDENT_AMBULATORY_CARE_PROVIDER_SITE_OTHER): Payer: Medicare Other | Admitting: Pharmacist

## 2013-04-01 DIAGNOSIS — I82401 Acute embolism and thrombosis of unspecified deep veins of right lower extremity: Secondary | ICD-10-CM

## 2013-04-01 DIAGNOSIS — I82A19 Acute embolism and thrombosis of unspecified axillary vein: Secondary | ICD-10-CM

## 2013-04-01 DIAGNOSIS — I82409 Acute embolism and thrombosis of unspecified deep veins of unspecified lower extremity: Secondary | ICD-10-CM

## 2013-04-01 DIAGNOSIS — I2699 Other pulmonary embolism without acute cor pulmonale: Secondary | ICD-10-CM

## 2013-04-01 DIAGNOSIS — I82A11 Acute embolism and thrombosis of right axillary vein: Secondary | ICD-10-CM

## 2013-04-01 LAB — POCT INR: INR: 3

## 2013-04-02 ENCOUNTER — Encounter: Payer: Self-pay | Admitting: Internal Medicine

## 2013-04-10 ENCOUNTER — Ambulatory Visit (HOSPITAL_BASED_OUTPATIENT_CLINIC_OR_DEPARTMENT_OTHER): Payer: Medicare Other | Attending: Pulmonary Disease

## 2013-04-10 VITALS — Ht 69.0 in | Wt 216.0 lb

## 2013-04-10 DIAGNOSIS — G4730 Sleep apnea, unspecified: Secondary | ICD-10-CM

## 2013-04-10 DIAGNOSIS — G4733 Obstructive sleep apnea (adult) (pediatric): Secondary | ICD-10-CM

## 2013-04-14 ENCOUNTER — Other Ambulatory Visit: Payer: Self-pay | Admitting: Family Medicine

## 2013-04-15 ENCOUNTER — Ambulatory Visit (INDEPENDENT_AMBULATORY_CARE_PROVIDER_SITE_OTHER): Payer: Medicare Other | Admitting: Pharmacist

## 2013-04-15 DIAGNOSIS — I82409 Acute embolism and thrombosis of unspecified deep veins of unspecified lower extremity: Secondary | ICD-10-CM

## 2013-04-15 DIAGNOSIS — I82401 Acute embolism and thrombosis of unspecified deep veins of right lower extremity: Secondary | ICD-10-CM

## 2013-04-15 DIAGNOSIS — I2699 Other pulmonary embolism without acute cor pulmonale: Secondary | ICD-10-CM

## 2013-04-15 DIAGNOSIS — I82A11 Acute embolism and thrombosis of right axillary vein: Secondary | ICD-10-CM

## 2013-04-15 DIAGNOSIS — I82A19 Acute embolism and thrombosis of unspecified axillary vein: Secondary | ICD-10-CM

## 2013-04-16 ENCOUNTER — Telehealth: Payer: Self-pay | Admitting: Pulmonary Disease

## 2013-04-16 DIAGNOSIS — G4761 Periodic limb movement disorder: Secondary | ICD-10-CM

## 2013-04-16 DIAGNOSIS — G4733 Obstructive sleep apnea (adult) (pediatric): Secondary | ICD-10-CM

## 2013-04-16 NOTE — Procedures (Signed)
NAMEHARRELL, NIEHOFF NO.:  [id number]  MEDICAL RECORD NO.:  [id number]          PATIENT TYPE:  OUT  LOCATION:  SLEEP CENTER                 FACILITY[facility]  [facility]  PHYSICIAN:  Coralyn Helling, MD        DATE OF BIRTH:  July 13, 1941  DATE OF STUDY:  04/10/2013                           NOCTURNAL POLYSOMNOGRAM  REFERRING PHYSICIAN:  Coralyn Helling, MD  FACILITY:  [facility]  REFERRING PHYSICIAN:  Coralyn Helling, MD.  INDICATION FOR STUDY:  [name] is a 72 year old male, who has a history of systolic heart failure.  He also has a prior history of obstructive sleep apnea, but had difficulty tolerating CPAP therapy previously.  He had a recent overnight oximetry, which showed significant oxygen desaturations.  He continues to have snoring and sleep disruption.  He is referred to the sleep lab for further evaluation of hypersomnia with obstructive sleep apnea.  Height is 5 feet 9 inches, weight is 216 pounds.  BMI is 32.  Neck size is 16 inches.  MEDICATIONS:  Medications are reviewed in the chart.  The patient took an Ambien on the night of study.  EPWORTH SLEEPINESS SCORE:  3.  SLEEP ARCHITECTURE:  Total recording time was 384 minutes.  Total sleep time was 263 minutes.  Sleep efficiency was 68%.  Sleep latency was 9.5 minutes.  REM latency was 84 minutes.  The study was notable for lack of slow-wave sleep.  He slept predominantly in the nonsupine position.  RESPIRATORY DATA:  The average respiratory rate was 16.  Loud snoring was noted by the technician.  The overall apnea/hypopnea index was 16.4. The events were exclusively obstructive in nature.  The REM apnea- hypopnea index was 38.9.  The non-REM apnea-hypopnea index 14.2.  OXYGEN DATA:  The baseline oxygenation was 92%.  The oxygen saturation nadir was 81%.  The patient spent a total of 3.7 minutes with an oxygen saturation below 88%.  The study was conducted without the use of supplemental  oxygen.  CARDIAC DATA:  The average heart rate was 66 and the rhythm strip showed sinus rhythm with occasional PVCs.  MOVEMENT-PARASOMNIA:  The periodic limb movement index was 82.  The patient had 2 restroom trips.  IMPRESSION:  This study shows evidence for moderate obstructive sleep apnea with an overall apnea/hypopnea index was 16.4 and oxygen saturation nadir of 81%.  He did have a significant REM effect to sleep- disordered breathing.  He had an increase in his periodic limb movement index and clinical correlation will be necessary to determine the significance of this.  In addition to diet, exercise, and weight reduction, additional therapeutic interventions could include CPAP therapy, oral appliance, or surgical intervention.     Coralyn Helling, MD Diplomat, American Board of Sleep Medicine    VS/MEDQ  D:  04/16/2013 10:11:32  T:  04/16/2013 11:53:14  Job:  [id number]

## 2013-04-16 NOTE — Telephone Encounter (Signed)
Spoke to pt. He is getting ready to do some traveling and will need to call [place] back when he gets back in to town.

## 2013-04-16 NOTE — Telephone Encounter (Signed)
PSG 04/10/13 >> AHI 16.4, SpO2 low 81%, PLMI 82, REM AHI 30.9.  Will have my nurse schedule pt for ROV to review results.

## 2013-04-29 ENCOUNTER — Ambulatory Visit (INDEPENDENT_AMBULATORY_CARE_PROVIDER_SITE_OTHER): Payer: Medicare Other | Admitting: *Deleted

## 2013-04-29 DIAGNOSIS — I82A19 Acute embolism and thrombosis of unspecified axillary vein: Secondary | ICD-10-CM

## 2013-04-29 DIAGNOSIS — I2699 Other pulmonary embolism without acute cor pulmonale: Secondary | ICD-10-CM

## 2013-04-29 DIAGNOSIS — I82A11 Acute embolism and thrombosis of right axillary vein: Secondary | ICD-10-CM

## 2013-04-29 LAB — POCT INR: INR: 2.7

## 2013-04-30 ENCOUNTER — Other Ambulatory Visit: Payer: Self-pay | Admitting: Family Medicine

## 2013-05-01 ENCOUNTER — Other Ambulatory Visit: Payer: Self-pay | Admitting: Family Medicine

## 2013-05-01 NOTE — Telephone Encounter (Signed)
Faxed RX to pharmacy.  

## 2013-05-01 NOTE — Telephone Encounter (Signed)
Please advise refill? Last RX was done on 02-25-13 quantity 30 with 1 refill  If ok fax to [phone]

## 2013-05-14 ENCOUNTER — Encounter (HOSPITAL_COMMUNITY): Payer: Self-pay | Admitting: Cardiology

## 2013-05-14 ENCOUNTER — Telehealth (HOSPITAL_COMMUNITY): Payer: Self-pay | Admitting: Cardiology

## 2013-05-14 NOTE — Telephone Encounter (Signed)
Attempting to contact pt to schedule follow up and ECHO (August recalls) I have been unable to reach this patient by phone.  A letter is being sent to the last known home address.

## 2013-05-27 ENCOUNTER — Ambulatory Visit (INDEPENDENT_AMBULATORY_CARE_PROVIDER_SITE_OTHER): Payer: Medicare Other | Admitting: *Deleted

## 2013-05-27 DIAGNOSIS — I2699 Other pulmonary embolism without acute cor pulmonale: Secondary | ICD-10-CM

## 2013-05-27 DIAGNOSIS — I82A19 Acute embolism and thrombosis of unspecified axillary vein: Secondary | ICD-10-CM

## 2013-05-27 DIAGNOSIS — I82A11 Acute embolism and thrombosis of right axillary vein: Secondary | ICD-10-CM

## 2013-06-13 ENCOUNTER — Other Ambulatory Visit (HOSPITAL_COMMUNITY): Payer: Self-pay | Admitting: *Deleted

## 2013-06-13 MED ORDER — NITROGLYCERIN 0.4 MG SL SUBL: 0.4000 mg | SUBLINGUAL_TABLET | SUBLINGUAL | Status: DC | PRN

## 2013-06-18 ENCOUNTER — Ambulatory Visit (INDEPENDENT_AMBULATORY_CARE_PROVIDER_SITE_OTHER): Payer: Medicare Other | Admitting: *Deleted

## 2013-06-18 DIAGNOSIS — I82A11 Acute embolism and thrombosis of right axillary vein: Secondary | ICD-10-CM

## 2013-06-18 DIAGNOSIS — I82A19 Acute embolism and thrombosis of unspecified axillary vein: Secondary | ICD-10-CM

## 2013-06-18 DIAGNOSIS — I2699 Other pulmonary embolism without acute cor pulmonale: Secondary | ICD-10-CM

## 2013-06-19 ENCOUNTER — Other Ambulatory Visit: Payer: Self-pay | Admitting: Family Medicine

## 2013-06-20 NOTE — Telephone Encounter (Signed)
Please advise re: refill:   Medication name:  Name from pharmacy:  zolpidem (AMBIEN CR) 12.5 MG CR tablet  ZOLPIDEM TARTRATE 12.5MG  TER   Sig: TAKE ONE TABLET BY MOUTH EVERY NIGHT AT BEDTIME AS NEEDED FOR INSOMNIA   Dispense: Not specified (Pharmacy requested 30 each) Start: 06/19/2013  Class: Normal   Requested on: 05/01/2013   Originally ordered on: 02/11/2012 Last refill: 05/29/2013

## 2013-06-20 NOTE — Telephone Encounter (Signed)
I informed MD and Rolinda Roan per MD and was told to dismiss patient. No more controlled substances and we will see pt for acute issues.

## 2013-06-20 NOTE — Telephone Encounter (Signed)
Spoke with pharmacist Julian Reil and he states that Marj at Dr Mariel Aloe office called this into them. Okaying Ambien 12.5 mg # 30 with 1 refill.  Bart stated that pt picked up RX at 2:43 pm and Marj called 30 minutes before that. Bart also stated that Dr Abner Greenspan authorizes this and gave Dr Mariel Aloe DEA #.

## 2013-06-23 ENCOUNTER — Other Ambulatory Visit (HOSPITAL_COMMUNITY): Payer: Self-pay | Admitting: *Deleted

## 2013-06-23 MED ORDER — CARVEDILOL 25 MG PO TABS: 25.0000 mg | ORAL_TABLET | Freq: Two times a day (BID) | ORAL | Status: DC

## 2013-06-23 MED ORDER — RAMIPRIL 5 MG PO CAPS: 5.0000 mg | ORAL_CAPSULE | Freq: Every day | ORAL | Status: DC

## 2013-06-24 NOTE — Telephone Encounter (Signed)
Letter printed and put on Brian's desk.   I called and spoke to [place] and cancelled any refills on controlled substances and left only 1 more refill on all other medications.

## 2013-06-27 ENCOUNTER — Telehealth: Payer: Self-pay | Admitting: Family Medicine

## 2013-06-27 NOTE — Telephone Encounter (Signed)
Dismissal Letter sent by Certified Mail 06/27/2013  Received the Return Receipt showing someone picked up the Dismissal Letter 07/08/2013

## 2013-07-04 ENCOUNTER — Telehealth: Payer: Self-pay

## 2013-07-04 ENCOUNTER — Telehealth (HOSPITAL_COMMUNITY): Payer: Self-pay | Admitting: Cardiology

## 2013-07-04 DIAGNOSIS — I5022 Chronic systolic (congestive) heart failure: Secondary | ICD-10-CM

## 2013-07-04 NOTE — Telephone Encounter (Signed)
FYI:   Patient called stating that we are falsely accusing him of something that he didn't do. I tried to explain to [name] that an RX was fraudgently called into the pharmacy that hadn't been approved and that is an immediate termination.  Pt stated that he came into the office yesterday and was told to call Rolinda Roan. Pt states he received a call back from a Vicie Mutters that didn't know a "damn" thing. I asked pt to please watch his mouth. Pt stated he was sorry but "she" doesn't know anything. I informed pt again that this is the rules of controlled substances and we can't turn it around. That the RX was called in fraudgently and that is immediate termination. Pt then started raising his voice and I stated that he would have to wait until Rolinda Roan returns back to work or speak with Vicie Mutters that I was done talking about this and him talking to me this way that there was nothing I could do. I told the patient to have a nice day and hung up the phone.

## 2013-07-04 NOTE — Telephone Encounter (Signed)
Understood. Would not change anything

## 2013-07-04 NOTE — Telephone Encounter (Signed)
ORDER PLACED FOR UPCOMING ECHO 

## 2013-07-16 ENCOUNTER — Ambulatory Visit (INDEPENDENT_AMBULATORY_CARE_PROVIDER_SITE_OTHER): Payer: Medicare Other | Admitting: Pharmacist

## 2013-07-16 ENCOUNTER — Ambulatory Visit (HOSPITAL_COMMUNITY)
Admission: RE | Admit: 2013-07-16 | Discharge: 2013-07-16 | Disposition: A | Discharge status: Home / Self Care | Payer: Medicare Other | Source: Ambulatory Visit | Attending: Internal Medicine | Admitting: Internal Medicine

## 2013-07-16 ENCOUNTER — Ambulatory Visit (HOSPITAL_BASED_OUTPATIENT_CLINIC_OR_DEPARTMENT_OTHER)
Admission: RE | Admit: 2013-07-16 | Discharge: 2013-07-16 | Disposition: A | Discharge status: Home / Self Care | Payer: Medicare Other | Source: Ambulatory Visit | Attending: Internal Medicine | Admitting: Internal Medicine

## 2013-07-16 ENCOUNTER — Other Ambulatory Visit: Payer: Self-pay | Admitting: Pharmacist

## 2013-07-16 VITALS — BP 116/66 | HR 50 | Wt 213.5 lb

## 2013-07-16 DIAGNOSIS — I82A19 Acute embolism and thrombosis of unspecified axillary vein: Secondary | ICD-10-CM

## 2013-07-16 DIAGNOSIS — I079 Rheumatic tricuspid valve disease, unspecified: Secondary | ICD-10-CM | POA: Insufficient documentation

## 2013-07-16 DIAGNOSIS — E785 Hyperlipidemia, unspecified: Secondary | ICD-10-CM | POA: Insufficient documentation

## 2013-07-16 DIAGNOSIS — E119 Type 2 diabetes mellitus without complications: Secondary | ICD-10-CM | POA: Insufficient documentation

## 2013-07-16 DIAGNOSIS — Z95810 Presence of automatic (implantable) cardiac defibrillator: Secondary | ICD-10-CM | POA: Insufficient documentation

## 2013-07-16 DIAGNOSIS — I252 Old myocardial infarction: Secondary | ICD-10-CM | POA: Insufficient documentation

## 2013-07-16 DIAGNOSIS — I5022 Chronic systolic (congestive) heart failure: Secondary | ICD-10-CM

## 2013-07-16 DIAGNOSIS — I359 Nonrheumatic aortic valve disorder, unspecified: Secondary | ICD-10-CM | POA: Insufficient documentation

## 2013-07-16 DIAGNOSIS — I2510 Atherosclerotic heart disease of native coronary artery without angina pectoris: Secondary | ICD-10-CM | POA: Insufficient documentation

## 2013-07-16 DIAGNOSIS — I82A11 Acute embolism and thrombosis of right axillary vein: Secondary | ICD-10-CM

## 2013-07-16 DIAGNOSIS — Z86711 Personal history of pulmonary embolism: Secondary | ICD-10-CM | POA: Insufficient documentation

## 2013-07-16 DIAGNOSIS — K219 Gastro-esophageal reflux disease without esophagitis: Secondary | ICD-10-CM | POA: Insufficient documentation

## 2013-07-16 DIAGNOSIS — I82401 Acute embolism and thrombosis of unspecified deep veins of right lower extremity: Secondary | ICD-10-CM

## 2013-07-16 DIAGNOSIS — I82409 Acute embolism and thrombosis of unspecified deep veins of unspecified lower extremity: Secondary | ICD-10-CM

## 2013-07-16 DIAGNOSIS — I2699 Other pulmonary embolism without acute cor pulmonale: Secondary | ICD-10-CM

## 2013-07-16 DIAGNOSIS — Z87891 Personal history of nicotine dependence: Secondary | ICD-10-CM | POA: Insufficient documentation

## 2013-07-16 DIAGNOSIS — I509 Heart failure, unspecified: Secondary | ICD-10-CM | POA: Insufficient documentation

## 2013-07-16 LAB — BASIC METABOLIC PANEL
BUN: 44 mg/dL — ABNORMAL HIGH (ref 6–23)
CO2: 24 mEq/L (ref 19–32)
Calcium: 9.3 mg/dL (ref 8.4–10.5)
Glucose, Bld: 93 mg/dL (ref 70–99)
Potassium: 4.9 mEq/L (ref 3.5–5.1)
Sodium: 138 mEq/L (ref 135–145)

## 2013-07-16 MED ORDER — ZOLPIDEM TARTRATE ER 12.5 MG PO TBCR: 12.5000 mg | EXTENDED_RELEASE_TABLET | Freq: Every evening | ORAL | Status: DC | PRN

## 2013-07-16 MED ORDER — WARFARIN SODIUM 3 MG PO TABS: ORAL_TABLET | ORAL | Status: DC

## 2013-07-16 MED ORDER — RAMIPRIL 5 MG PO CAPS: 5.0000 mg | ORAL_CAPSULE | Freq: Every day | ORAL | Status: DC

## 2013-07-16 NOTE — Progress Notes (Signed)
*  PRELIMINARY RESULTS* Echocardiogram 2D Echocardiogram has been performed.  Joseph Hamilton 07/16/2013, 12:06 PM

## 2013-07-16 NOTE — Patient Instructions (Signed)
Will refer you to Dr. Beverely Low for Primary Care Issues.  Will refill Ambien and altace.  Refer back to Dr. Graciela Husbands to discuss ICD.  Follow up in 4 months.  Do the following things EVERYDAY: 1) Weigh yourself in the morning before breakfast. Write it down and keep it in a log. 2) Take your medicines as prescribed 3) Eat low salt foods-Limit salt (sodium) to 2000 mg per day.  4) Stay as active as you can everyday 5) Limit all fluids for the day to less than 2 liters

## 2013-07-16 NOTE — Progress Notes (Signed)
Patient ID: Joseph Hamilton, male   DOB: 12/02/1940, 72 y.o.   MRN: [id number] Hematologist: Dr Myna Hidalgo PCP: Dr Ty Hilts Pulmonary: Dr Craige Cotta  HPI:  Joseph Hamilton is a very pleasant 72 year old male with a history of coronary artery disease, status post previous myocardial infarction, as well as redo coronary bypass grafting in 2003.  He also has chronic Systolic Heart Failure, ICM, Last EF 35% (02/18/13).   The remainder of his medical history is notable for a history of pulmonary emboli in 2005, CRI baseline 2.0-2.5, hypertension, hyperlipidemia, OSA, and chronic dizziness/headaches, which have been evaluated extensively.ICD reached EOL in 12/13 and was extracted and not re-implanted.   Last cath 02/11/12 for NSTEMI manifested as scapular pain  1. Severe 3 vessel obstructive coronary disease.  2. Patent saphenous vein graft to the distal RCA  3. Patent LIMA graft to the mid LAD supplying the second diagonal branch.  4. Other saphenous vein grafts are occluded. These are presumably the saphenous vein graft to the obtuse marginal vessel and what was called a ramus branch.   Felt to be no clear culprit or easily revascularizable lesions. If symptoms progressed Rotoblator of LCX felt to be high risk option.   He as was admitted to [facility] with acute PE 02/18/13 after coumadin stopped (remote PE > 9 years ago). D-Dimer 11.96. Lower extremity dopplers + DVT RLE popliteal vein. ECHO  EF 35% no evidence of RV strain. Discharged on Lovenox/coumadin until INR > 2.  Follow up: Doing well. Was dismissed from PCP for asking for [facility] and needs new PCP. Has sleep study and needs follow up with Dr. Craige Cotta, which he will follow up with. DOE with going up steps. Denies orthopnea, CP, or PND. Weight at home 211 lbs. Back on coumadin and checked religiously.   Labs 04/28/13: K_ 4.9, creatinine 1.86  ROS: All systems negative except as listed in HPI, PMH and Problem List.  Past Medical History  Diagnosis Date  . Coronary artery  disease     s/p MI X 2, REDO CABG 2003, MYOVIEW 12/09 EF 28% , EXTENSIVE ANTERIOR, INFERIOR AND LATERAL INFARCT. VERY MILD ANTERIOR ISCHEMIA  . CHF (congestive heart failure)     SECONDARY TO ISCHEMIC CARDIOMYOPATHY ; EF 28% BY MYOVIEW ; 50% BY ECHO 07/2008; ECHO 09/2009 EF "MILD TO MODERATELY REDUCED"  . Hypertension   . Hyperlipidemia   . Dizziness     CHRONIC; s/p EXTENSIVE EVAL, s/p EVAL @ [facility]; HEADACHE:  s/p EXTENSIVE EVAL; ON ELAVIL  . Sleep apnea 03/31/10    UNABLE TO TOLERATE CPAP; > HOME SLEEP TEST AHI 15.6  . Obesity   . Dupuytren contracture   . Nephrolithiasis   . CRI (chronic renal insufficiency)     BASELINE Cr. 1.7 ----DR. MATTINGLY  . Carotid artery occlusion     0-39% BILATERALLY W/CHRONICALLY OCCLUDED L VERTEBRAL  . Angina   . ICD (implantable cardiac defibrillator) in place   . Pacemaker   . PE (pulmonary embolism) 2005  . Myocardial infarction     x3  . Diabetes mellitus without complication   . GERD (gastroesophageal reflux disease)   . Arthritis     Current Outpatient Prescriptions  Medication Sig Dispense Refill  . amitriptyline (ELAVIL) 25 MG tablet TAKE ONE TABLET DAILY AT BEDTIME  30 tablet  4  . aspirin EC 81 MG tablet Take 1 tablet (81 mg total) by mouth daily.  30 tablet  3  . carvedilol (COREG) 25 MG tablet Take 1 tablet (  25 mg total) by mouth 2 (two) times daily with a meal.  60 tablet  6  . CRESTOR 40 MG tablet TAKE ONE (1) TABLET EACH DAY  30 tablet  2  . fish oil-omega-3 fatty acids 1000 MG capsule Take 1 g by mouth daily.      . furosemide (LASIX) 40 MG tablet Take 40 mg by mouth See admin instructions. Patient takes 1 tablet every day with permission to take additional tablet if needed.      . furosemide (LASIX) 40 MG tablet TAKE ONE (1) TABLET BY MOUTH EVERY DAY  30 tablet  2  . isosorbide mononitrate (IMDUR) 60 MG 24 hr tablet Take 1 tablet (60 mg total) by mouth daily.  30 tablet  6  . nateglinide (STARLIX) 60 MG tablet Take 1 tablet (60  mg total) by mouth 2 (two) times daily with a meal. Take with the two largest meals of the day  60 tablet  3  . nitroGLYCERIN (NITROSTAT) 0.4 MG SL tablet Place 1 tablet (0.4 mg total) under the tongue every 5 (five) minutes x 3 doses as needed for chest pain.  25 tablet  3  . omeprazole (PRILOSEC) 40 MG capsule TAKE ONE (1) CAPSULE EACH DAY  30 capsule  5  . ramipril (ALTACE) 5 MG capsule Take 1 capsule (5 mg total) by mouth daily.  30 capsule  6  . warfarin (COUMADIN) 3 MG tablet As directed by the coumadin clinic      . zolpidem (AMBIEN CR) 12.5 MG CR tablet TAKE ONE TABLET BY MOUTH EVERY NIGHT AT BEDTIME AS NEEDED FOR INSOMNIA  30 tablet  1   No current facility-administered medications for this encounter.     Filed Vitals:   07/16/13 1207  BP: 116/66  Pulse: 50  Weight: 213 lb 8 oz (96.843 kg)  SpO2: 98%    PHYSICAL EXAM: General:  No acute distress HEENT: normal Neck: supple. JVP 5-6  Carotids 2+ bilaterally; no bruits. No lymphadenopathy or thryomegaly appreciated. Cor: PMI normal. Regular. No rubs, gallops or murmurs. Lungs: clear. Decreased at bases.  Abdomen: soft, nontender, distended. No hepatosplenomegaly. No bruits or masses. Good bowel sounds. Extremities: no cyanosis, clubbing, rash,  RLE 1+ edema Neuro: alert & orientedx3, cranial nerves grossly intact. Moves all 4 extremities w/o difficulty. Affect pleasant. Skin: Chest keloid present central aspect.   ASSESSMENT & PLAN:  1) Chronic systolic HF, ICM, EF 25-30% (06/2013) Joseph Hamilton is a 72 yo male who previously had a low EF and ICD was placed. His EF improved to 35% in 2013 and in Dec of last year his ICD reached EOL and the ICD was explanted and leads were extracted. Unfortunately Dr. Gala Romney reviewed his ECHO today and his EF is now back to 25-30%. Will refer back to Dr. Graciela Husbands for ICD, preferably Medtronic for HF diagnostics. - NYHA II symptoms and volume status stable. - On goal dose coreg. Continue altace 5  mg daily, will not increase or add spiro with CRI and K+ 4.9. - Reinforced the need and importance of daily weights, a low sodium diet, and fluid restriction (less than 2 L a day). Instructed to call the HF clinic if weight increases more than 3 lbs overnight or 5 lbs in a week.  2) CAD - no s/s of ischemia. Will continue ASA, statin and BB. 3) HLD - continue statin. Referred to new PCP today to help with management 4) ?OSA - Has sleep study and then went  on vacation. Needs to follow up with Dr. Craige Cotta to see whether he needs CPAP. He is aware and will make appt. 5) Insomnia - Patient has been on Ambien 12.5 mg nightly for years. Some issue with PCP over medication and was discharged from practice and no longer to be seen by [facility]. Will give prescription today and set up appt with new PCP.  F/U 4 months.  Ulla Potash B NP-C 5:22 PM   -

## 2013-07-19 NOTE — Progress Notes (Signed)
Patient ID: Joseph Hamilton, male   DOB: 09-12-1941, 72 y.o.   MRN: [id number] Hematologist: Dr Myna Hidalgo PCP: Dr Ty Hilts Pulmonary: Dr Craige Cotta  HPI:  Joseph Hamilton is a very pleasant 72 year old male with a history of coronary artery disease, status post previous myocardial infarction, as well as redo coronary bypass grafting in 2003.  He also has chronic systolic HF due to ICM with EF 35% (02/18/13).   The remainder of his medical history is notable for a history of pulmonary emboli in 2005 (which recurred after stopping anti-coagulation), CRI baseline 2.0-2.5, hypertension, hyperlipidemia, OSA, and chronic dizziness/headaches, which have been evaluated extensively.ICD reached EOL in 12/13 and was extracted and not re-implanted.   Last cath 02/11/12 for NSTEMI manifested as scapular pain  1. Severe 3 vessel obstructive coronary disease.  2. Patent saphenous vein graft to the distal RCA  3. Patent LIMA graft to the mid LAD supplying the second diagonal branch.  4. Other saphenous vein grafts are occluded. These are presumably the saphenous vein graft to the obtuse marginal vessel and what was called a ramus branch.   Felt to be no clear culprit or easily revascularizable lesions. If symptoms progressed Rotoblator of LCX felt to be high risk option.   He was admitted to [facility] with acute PE 02/18/13 after coumadin stopped (remote PE > 9 years ago). D-Dimer 11.96. Lower extremity dopplers + DVT RLE popliteal vein. ECHO  EF 35% no evidence of RV strain. Discharged on Lovenox/coumadin until INR > 2.  Follow up: Doing well. Was dismissed from PCP for asking for [facility] and needs new PCP. Has sleep study and needs follow up with Dr. Craige Cotta, which he will follow up with. DOE with going up steps. Denies orthopnea, CP, or PND. Weight at home 211 lbs. Back on coumadin and checked religiously.   Labs 04/28/13: K_ 4.9, creatinine 1.86  ROS: All systems negative except as listed in HPI, PMH and Problem List.  Past Medical  History  Diagnosis Date  . Coronary artery disease     s/p MI X 2, REDO CABG 2003, MYOVIEW 12/09 EF 28% , EXTENSIVE ANTERIOR, INFERIOR AND LATERAL INFARCT. VERY MILD ANTERIOR ISCHEMIA  . CHF (congestive heart failure)     SECONDARY TO ISCHEMIC CARDIOMYOPATHY ; EF 28% BY MYOVIEW ; 50% BY ECHO 07/2008; ECHO 09/2009 EF "MILD TO MODERATELY REDUCED"  . Hypertension   . Hyperlipidemia   . Dizziness     CHRONIC; s/p EXTENSIVE EVAL, s/p EVAL @ [facility]; HEADACHE:  s/p EXTENSIVE EVAL; ON ELAVIL  . Sleep apnea 03/31/10    UNABLE TO TOLERATE CPAP; > HOME SLEEP TEST AHI 15.6  . Obesity   . Dupuytren contracture   . Nephrolithiasis   . CRI (chronic renal insufficiency)     BASELINE Cr. 1.7 ----DR. MATTINGLY  . Carotid artery occlusion     0-39% BILATERALLY W/CHRONICALLY OCCLUDED L VERTEBRAL  . Angina   . ICD (implantable cardiac defibrillator) in place   . Pacemaker   . PE (pulmonary embolism) 2005  . Myocardial infarction     x3  . Diabetes mellitus without complication   . GERD (gastroesophageal reflux disease)   . Arthritis     Current Outpatient Prescriptions  Medication Sig Dispense Refill  . amitriptyline (ELAVIL) 25 MG tablet TAKE ONE TABLET DAILY AT BEDTIME  30 tablet  4  . aspirin EC 81 MG tablet Take 1 tablet (81 mg total) by mouth daily.  30 tablet  3  . carvedilol (COREG) 25  MG tablet Take 1 tablet (25 mg total) by mouth 2 (two) times daily with a meal.  60 tablet  6  . CRESTOR 40 MG tablet TAKE ONE (1) TABLET EACH DAY  30 tablet  2  . fish oil-omega-3 fatty acids 1000 MG capsule Take 1 g by mouth daily.      . furosemide (LASIX) 40 MG tablet Take 40 mg by mouth See admin instructions. Patient takes 1 tablet every day with permission to take additional tablet if needed.      . isosorbide mononitrate (IMDUR) 60 MG 24 hr tablet Take 1 tablet (60 mg total) by mouth daily.  30 tablet  6  . nateglinide (STARLIX) 60 MG tablet Take 1 tablet (60 mg total) by mouth 2 (two) times daily with  a meal. Take with the two largest meals of the day  60 tablet  3  . nitroGLYCERIN (NITROSTAT) 0.4 MG SL tablet Place 1 tablet (0.4 mg total) under the tongue every 5 (five) minutes x 3 doses as needed for chest pain.  25 tablet  3  . omeprazole (PRILOSEC) 40 MG capsule TAKE ONE (1) CAPSULE EACH DAY  30 capsule  5  . ramipril (ALTACE) 5 MG capsule Take 1 capsule (5 mg total) by mouth daily.  30 capsule  6  . zolpidem (AMBIEN CR) 12.5 MG CR tablet Take 1 tablet (12.5 mg total) by mouth at bedtime as needed for sleep.  30 tablet  3  . warfarin (COUMADIN) 3 MG tablet Take as directed by the coumadin clinic  45 tablet  3   No current facility-administered medications for this encounter.     Filed Vitals:   07/16/13 1207  BP: 116/66  Pulse: 50  Weight: 213 lb 8 oz (96.843 kg)  SpO2: 98%    PHYSICAL EXAM: General:  No acute distress HEENT: normal Neck: supple. JVP 5-6  Carotids 2+ bilaterally; no bruits. No lymphadenopathy or thryomegaly appreciated. Cor: PMI normal. Regular. No rubs, gallops or murmurs. Lungs: clear. Decreased at bases.  Abdomen: soft, nontender, distended. No hepatosplenomegaly. No bruits or masses. Good bowel sounds. Extremities: no cyanosis, clubbing, rash,  RLE 1+ edema Neuro: alert & orientedx3, cranial nerves grossly intact. Moves all 4 extremities w/o difficulty. Affect pleasant. Skin: Chest keloid present central aspect.   ASSESSMENT & PLAN:  1) Chronic systolic HF, ICM, Joseph Hamilton is a 72 yo male who previously had a low EF and ICD was placed. His EF improved to 35% in 2013 and in Dec of last year his ICD reached EOL and the ICD was explanted and leads were extracted. I reviewed his ECHO today and his EF appears to be back down to 30%. Will refer back to Dr. Graciela Husbands for ICD, preferably Medtr[employer] for HF diagnostics. - NYHA II symptoms and volume status stable. - On goal dose coreg. Continue altace 5 mg daily, will not increase or add spiro with CRI and K+ 4.9. -  Reinforced the need and importance of daily weights, a low sodium diet, and fluid restriction (less than 2 L a day). Instructed to call the HF clinic if weight increases more than 3 lbs overnight or 5 lbs in a week.  2) CAD - no s/s of ischemia. Will continue ASA, statin and BB. 3) HLD - continue statin. Referred to new PCP today to help with management 4) ?OSA - Has sleep study and then went on vacation. Needs to follow up with Dr. Craige Cotta to see whether he needs CPAP.  He is aware and will make appt. 5) Insomnia - Patient has been on Ambien 12.5 mg nightly for years. Some issue with PCP over medication and was discharged from practice and no longer to be seen by [facility]. Will give prescription today and set up appt with new PCP. 6) PE, recurrent - Needs lifelong anti-coagulation  F/U 4 months.  Arvilla Meres MD  11:28 PM   Addendum: Echo read formally by Dr. Myrtis Ser with EF 40%. Will review with colleagues.May need cMRI to more clearly evaluate.  Airis Barbee,MD 11:32 PM     -

## 2013-08-19 ENCOUNTER — Telehealth: Payer: Self-pay | Admitting: Pulmonary Disease

## 2013-08-19 NOTE — Telephone Encounter (Signed)
Spoke to pt. Had sleep study done in 04/2013. Never came back to go over these results with Dr. Craige Cotta due to him traveling. Pt has been scheduled on 09/01/13 at 10am.

## 2013-08-26 ENCOUNTER — Encounter: Payer: Self-pay | Admitting: Internal Medicine

## 2013-08-26 ENCOUNTER — Ambulatory Visit (INDEPENDENT_AMBULATORY_CARE_PROVIDER_SITE_OTHER): Payer: Medicare Other | Admitting: Internal Medicine

## 2013-08-26 ENCOUNTER — Ambulatory Visit (INDEPENDENT_AMBULATORY_CARE_PROVIDER_SITE_OTHER): Payer: Medicare Other | Admitting: *Deleted

## 2013-08-26 VITALS — BP 110/67 | HR 60 | Ht 69.0 in | Wt 220.0 lb

## 2013-08-26 DIAGNOSIS — I82A11 Acute embolism and thrombosis of right axillary vein: Secondary | ICD-10-CM

## 2013-08-26 DIAGNOSIS — I2589 Other forms of chronic ischemic heart disease: Secondary | ICD-10-CM

## 2013-08-26 DIAGNOSIS — I255 Ischemic cardiomyopathy: Secondary | ICD-10-CM

## 2013-08-26 DIAGNOSIS — Z95810 Presence of automatic (implantable) cardiac defibrillator: Secondary | ICD-10-CM

## 2013-08-26 DIAGNOSIS — I82A19 Acute embolism and thrombosis of unspecified axillary vein: Secondary | ICD-10-CM

## 2013-08-26 DIAGNOSIS — I2699 Other pulmonary embolism without acute cor pulmonale: Secondary | ICD-10-CM

## 2013-08-26 NOTE — Assessment & Plan Note (Signed)
Unclear EF read by Dr DB at 25-30%  Then overread 40%  Will undertake MRI to adjudicate the difference Long discussion regarding role of an ICD

## 2013-08-26 NOTE — Progress Notes (Signed)
Patient Care Team: Edwyna Perfect, MD as PCP - General (Internal Medicine)   HPI  Joseph Hamilton is a 72 y.o. male Seen in followup for ischemic heart disease with a previously implanted ICD for primary prevention. He has had intercurrent normalization of LV systolic function in thus  he declined device generator replacement at [facility] last year and the device generator was removed but not reimplanted   He has a history of coronary artery disease, status post previous myocardial infarction, as well as redo coronary bypass grafting in 2003. He also has congestive heart failure related to an ischemic cardiomyopathy   He suffered another non-STEMI in May 2013: Underwent catheterization demonstrating patent grafts to the distal RCA and the LIMA to the vein grafts occluded. Medical therapy was recommended. Ejection fraction by echo was 40-45%       Most recent ejection fraction was 40% measured by echo 10/14. Over read by Dr. Dorthea Cove suggested EF 25-30%.  He for  has is a lso had problems with primary care     Past Medical History  Diagnosis Date  . Coronary artery disease     s/p MI X 2, REDO CABG 2003, MYOVIEW 12/09 EF 28% , EXTENSIVE ANTERIOR, INFERIOR AND LATERAL INFARCT. VERY MILD ANTERIOR ISCHEMIA  . CHF (congestive heart failure)     SECONDARY TO ISCHEMIC CARDIOMYOPATHY ; EF 28% BY MYOVIEW ; 50% BY ECHO 07/2008; ECHO 09/2009 EF "MILD TO MODERATELY REDUCED"  . Hypertension   . Hyperlipidemia   . Dizziness     CHRONIC; s/p EXTENSIVE EVAL, s/p EVAL @ [facility]; HEADACHE:  s/p EXTENSIVE EVAL; ON ELAVIL  . Sleep apnea 03/31/10    UNABLE TO TOLERATE CPAP; > HOME SLEEP TEST AHI 15.6  . Obesity   . Dupuytren contracture   . Nephrolithiasis   . CRI (chronic renal insufficiency)     BASELINE Cr. 1.7 ----DR. MATTINGLY  . Carotid artery occlusion     0-39% BILATERALLY W/CHRONICALLY OCCLUDED L VERTEBRAL  . Angina   . ICD (implantable cardiac defibrillator) in place   . Pacemaker   . PE  (pulmonary embolism) 2005  . Myocardial infarction     x3  . Diabetes mellitus without complication   . GERD (gastroesophageal reflux disease)   . Arthritis     Past Surgical History  Procedure Laterality Date  . Insert / replace / remove pacemaker  2006    s/p GUIDANT IMPLANTABLE CARDIVERTER DEFIBRILLATOR  . Cataract extraction    . Tonsillectomy      AS A CHILD  . Kidney stone surgery    . Coronary artery bypass graft  1995     AND 2003  . Eye surgery    . Pacemaker lead removal  08/28/2012    Procedure: PACEMAKER LEAD REMOVAL;  Surgeon: Marinus Maw, MD;  Location: [facility] OR;  Service: Cardiovascular;  Laterality: N/A;    Current Outpatient Prescriptions  Medication Sig Dispense Refill  . amitriptyline (ELAVIL) 25 MG tablet TAKE ONE TABLET DAILY AT BEDTIME  30 tablet  4  . aspirin EC 81 MG tablet Take 1 tablet (81 mg total) by mouth daily.  30 tablet  3  . carvedilol (COREG) 25 MG tablet Take 1 tablet in the morning and 1/2 tablet in the evening      . CRESTOR 40 MG tablet TAKE ONE (1) TABLET EACH DAY  30 tablet  2  . fish oil-omega-3 fatty acids 1000 MG capsule Take 1 g  by mouth daily.      . furosemide (LASIX) 40 MG tablet Take 40 mg by mouth See admin instructions. Patient takes 1 tablet every day with permission to take additional tablet if needed.      . isosorbide mononitrate (IMDUR) 60 MG 24 hr tablet Take 1 tablet (60 mg total) by mouth daily.  30 tablet  6  . nateglinide (STARLIX) 60 MG tablet Take 60 mg by mouth 2 (two) times daily with a meal. Take with the two largest meals of the day. Pt sometimes forgets to take      . nitroGLYCERIN (NITROSTAT) 0.4 MG SL tablet Place 1 tablet (0.4 mg total) under the tongue every 5 (five) minutes x 3 doses as needed for chest pain.  25 tablet  3  . omeprazole (PRILOSEC) 40 MG capsule TAKE ONE (1) CAPSULE EACH DAY  30 capsule  5  . ramipril (ALTACE) 5 MG capsule Take 1 capsule (5 mg total) by mouth daily.  30 capsule  6  . warfarin  (COUMADIN) 3 MG tablet Take as directed by the coumadin clinic  45 tablet  3  . zolpidem (AMBIEN CR) 12.5 MG CR tablet Take 1 tablet (12.5 mg total) by mouth at bedtime as needed for sleep.  30 tablet  3   No current facility-administered medications for this visit.    Allergies  Allergen Reactions  . Ezetimibe-Simvastatin     REACTION: myalgia: TOLERATES PRAVOCHOL OK    Review of Systems negative except from HPI and PMH  Physical Exam BP 110/67  Pulse 60  Ht 5\' 9"  (1.753 m)  Wt 220 lb (99.791 kg)  BMI 32.47 kg/m2 Well developed and well nourished in no acute distress HENT normal E scleral and icterus clear Neck Supple JVP flat; carotids brisk and full Clear to ausculation  Regular rate and rhythm, no murmurs gallops or rub Soft with active bowel sounds No clubbing cyanosis none Edema Alert and oriented, grossly normal motor and sensory function Skin Warm and Dry    Assessment and  Plan

## 2013-08-26 NOTE — Patient Instructions (Signed)
Your physician has requested that you have a cardiac MRI. Cardiac MRI uses a computer to create images of your heart as its beating, producing both still and moving pictures of your heart and major blood vessels. For further information please visit [email or web address]. Please follow the instruction sheet given to you today for more information.   We will be in touch with plan of care after cardiac MRI  Your physician recommends that you continue on your current medications as directed. Please refer to the Current Medication list given to you today.

## 2013-09-01 ENCOUNTER — Encounter: Payer: Self-pay | Admitting: Pulmonary Disease

## 2013-09-01 ENCOUNTER — Ambulatory Visit (INDEPENDENT_AMBULATORY_CARE_PROVIDER_SITE_OTHER): Payer: Medicare Other | Admitting: Pulmonary Disease

## 2013-09-01 VITALS — BP 120/78 | HR 52 | Ht 69.5 in | Wt 223.0 lb

## 2013-09-01 DIAGNOSIS — G4733 Obstructive sleep apnea (adult) (pediatric): Secondary | ICD-10-CM

## 2013-09-01 NOTE — Patient Instructions (Signed)
Will arrange for CPAP set up at home Follow up in 2 months 

## 2013-09-01 NOTE — Progress Notes (Signed)
Chief Complaint  Patient presents with  . Sleep Apnea    Review sleep study.    History of Present Illness: Joseph Hamilton is a 72 y.o. male with OSA and insomnia.  He is here to review his recent sleep study.  This was done in July.  He was not able to schedule earlier appointment.  He did not like his experience in sleep lab, and is reluctant to go back to sleep lab for any purpose.  TESTS: Home sleep test 03/31/10 >> AHI 15.6 from 03/31/10  V/Q 02/18/13 >> b/l wedge shaped perfusion defects lower lobes, high probability.  Doppler legs 02/19/13 >> subacute DVT Rt popliteal vein, Rt posterior tibial vein, Rt peroneal vein  Echo 02/19/13 >> EF 30 to 35%, mild AI, PAS 42 mmHg.  ONO with RA 03/06/13 >> Test time 7 hrs 44 min. Baseline SpO2 93.4%, low SpO2 81%. Spent 29 min (6.3%) with SpO2 < 88%. PSG 04/10/13 >> AHI 16.4, SpO2 low 81%, PLMI 82, REM AHI 30.9.   Joseph Hamilton  has a past medical history of Coronary artery disease; CHF (congestive heart failure); Hypertension; Hyperlipidemia; Dizziness; Sleep apnea (03/31/10); Obesity; Dupuytren contracture; Nephrolithiasis; CRI (chronic renal insufficiency); Carotid artery occlusion; Angina; ICD (implantable cardiac defibrillator) in place; Pacemaker; PE (pulmonary embolism) (2005); Myocardial infarction; Diabetes mellitus without complication; GERD (gastroesophageal reflux disease); and Arthritis.  Joseph Hamilton  has past surgical history that includes Insert / replace / remove pacemaker (2006); Cataract extraction; Tonsillectomy; Kidney stone surgery; Coronary artery bypass graft (1995); Eye surgery; and Pacemaker lead removal (08/28/2012).  Prior to Admission medications   Medication Sig Start Date End Date Taking? Authorizing Provider  amitriptyline (ELAVIL) 25 MG tablet TAKE ONE TABLET DAILY AT BEDTIME 04/14/13  Yes Bradd Canary, MD  aspirin EC 81 MG tablet Take 1 tablet (81 mg total) by mouth daily. 02/25/13  Yes Amy D Clegg, NP  carvedilol (COREG)  25 MG tablet Take 1 tablet in the morning and 1/2 tablet in the evening 06/23/13  Yes Dolores Patty, MD  CRESTOR 40 MG tablet TAKE ONE (1) TABLET EACH DAY 04/30/13  Yes Bradd Canary, MD  fish oil-omega-3 fatty acids 1000 MG capsule Take 1 g by mouth daily.   Yes Historical Provider, MD  furosemide (LASIX) 40 MG tablet Take 40 mg by mouth See admin instructions. Patient takes 1 tablet every day with permission to take additional tablet if needed.   Yes Historical Provider, MD  isosorbide mononitrate (IMDUR) 60 MG 24 hr tablet Take 1 tablet (60 mg total) by mouth daily. 02/07/13 02/07/14 Yes Dolores Patty, MD  nateglinide (STARLIX) 60 MG tablet Take 60 mg by mouth 2 (two) times daily with a meal. Take with the two largest meals of the day. Pt sometimes forgets to take 11/27/12  Yes Bradd Canary, MD  nitroGLYCERIN (NITROSTAT) 0.4 MG SL tablet Place 1 tablet (0.4 mg total) under the tongue every 5 (five) minutes x 3 doses as needed for chest pain. 06/13/13 06/13/14 Yes Bevelyn Buckles Bensimhon, MD  omeprazole (PRILOSEC) 40 MG capsule TAKE ONE (1) CAPSULE EACH DAY 03/14/13  Yes Bradd Canary, MD  ramipril (ALTACE) 5 MG capsule Take 1 capsule (5 mg total) by mouth daily. 07/16/13  Yes Aundria Rud, NP  warfarin (COUMADIN) 3 MG tablet Take as directed by the coumadin clinic 07/16/13  Yes Dolores Patty, MD  zolpidem (AMBIEN CR) 12.5 MG CR tablet Take 1 tablet (12.5 mg total) by mouth at  bedtime as needed for sleep. 07/16/13  Yes Aundria Rud, NP    Allergies  Allergen Reactions  . Ezetimibe-Simvastatin     REACTION: myalgia: TOLERATES PRAVOCHOL OK     Physical Exam:  General - No distress ENT - No sinus tenderness, no oral exudate, no LAN, multiple dental work Cardiac - s1s2 regular, no murmur Chest - No wheeze/rales/dullness Back - No focal tenderness Abd - Soft, non-tender Ext - No edema Neuro - Normal strength Skin - No rashes Psych - normal mood, and  behavior   Assessment/Plan:  Coralyn Helling, MD [facility]/Sleep Pager:  [phone]

## 2013-09-03 NOTE — Assessment & Plan Note (Signed)
He has moderate obstructive sleep apnea.  I have reviewed the recent sleep study results with the patient.  We discussed how sleep apnea can affect various health problems including risks for hypertension, cardiovascular disease, and diabetes.  We also discussed how sleep disruption can increase risks for accident, such as while driving.  Weight loss as a means of improving sleep apnea was also reviewed.  Additional treatment options discussed were CPAP therapy, oral appliance, and surgical intervention.  Will arrange for auto CPAP set up at home. 

## 2013-09-04 ENCOUNTER — Encounter: Payer: Self-pay | Admitting: Internal Medicine

## 2013-09-06 ENCOUNTER — Emergency Department (HOSPITAL_BASED_OUTPATIENT_CLINIC_OR_DEPARTMENT_OTHER): Payer: Medicare Other

## 2013-09-06 ENCOUNTER — Encounter (HOSPITAL_BASED_OUTPATIENT_CLINIC_OR_DEPARTMENT_OTHER): Payer: Self-pay | Admitting: Emergency Medicine

## 2013-09-06 ENCOUNTER — Emergency Department (HOSPITAL_BASED_OUTPATIENT_CLINIC_OR_DEPARTMENT_OTHER)
Admission: EM | Admit: 2013-09-06 | Discharge: 2013-09-06 | Disposition: A | Discharge status: Home / Self Care | Payer: Medicare Other | Attending: Emergency Medicine | Admitting: Emergency Medicine

## 2013-09-06 DIAGNOSIS — R42 Dizziness and giddiness: Secondary | ICD-10-CM | POA: Insufficient documentation

## 2013-09-06 DIAGNOSIS — R51 Headache: Secondary | ICD-10-CM | POA: Insufficient documentation

## 2013-09-06 DIAGNOSIS — R269 Unspecified abnormalities of gait and mobility: Secondary | ICD-10-CM | POA: Insufficient documentation

## 2013-09-06 DIAGNOSIS — N189 Chronic kidney disease, unspecified: Secondary | ICD-10-CM

## 2013-09-06 DIAGNOSIS — I509 Heart failure, unspecified: Secondary | ICD-10-CM | POA: Insufficient documentation

## 2013-09-06 DIAGNOSIS — M129 Arthropathy, unspecified: Secondary | ICD-10-CM | POA: Insufficient documentation

## 2013-09-06 DIAGNOSIS — I129 Hypertensive chronic kidney disease with stage 1 through stage 4 chronic kidney disease, or unspecified chronic kidney disease: Secondary | ICD-10-CM | POA: Insufficient documentation

## 2013-09-06 DIAGNOSIS — E669 Obesity, unspecified: Secondary | ICD-10-CM | POA: Insufficient documentation

## 2013-09-06 DIAGNOSIS — Z950 Presence of cardiac pacemaker: Secondary | ICD-10-CM | POA: Insufficient documentation

## 2013-09-06 DIAGNOSIS — Z86711 Personal history of pulmonary embolism: Secondary | ICD-10-CM | POA: Insufficient documentation

## 2013-09-06 DIAGNOSIS — K219 Gastro-esophageal reflux disease without esophagitis: Secondary | ICD-10-CM | POA: Insufficient documentation

## 2013-09-06 DIAGNOSIS — M79609 Pain in unspecified limb: Secondary | ICD-10-CM | POA: Insufficient documentation

## 2013-09-06 DIAGNOSIS — Z7901 Long term (current) use of anticoagulants: Secondary | ICD-10-CM | POA: Insufficient documentation

## 2013-09-06 DIAGNOSIS — I209 Angina pectoris, unspecified: Secondary | ICD-10-CM | POA: Insufficient documentation

## 2013-09-06 DIAGNOSIS — Z87891 Personal history of nicotine dependence: Secondary | ICD-10-CM | POA: Insufficient documentation

## 2013-09-06 DIAGNOSIS — Z79899 Other long term (current) drug therapy: Secondary | ICD-10-CM | POA: Insufficient documentation

## 2013-09-06 DIAGNOSIS — Z7982 Long term (current) use of aspirin: Secondary | ICD-10-CM | POA: Insufficient documentation

## 2013-09-06 DIAGNOSIS — I252 Old myocardial infarction: Secondary | ICD-10-CM | POA: Insufficient documentation

## 2013-09-06 DIAGNOSIS — H9319 Tinnitus, unspecified ear: Secondary | ICD-10-CM | POA: Insufficient documentation

## 2013-09-06 DIAGNOSIS — I2510 Atherosclerotic heart disease of native coronary artery without angina pectoris: Secondary | ICD-10-CM | POA: Insufficient documentation

## 2013-09-06 DIAGNOSIS — E119 Type 2 diabetes mellitus without complications: Secondary | ICD-10-CM | POA: Insufficient documentation

## 2013-09-06 DIAGNOSIS — Z87442 Personal history of urinary calculi: Secondary | ICD-10-CM | POA: Insufficient documentation

## 2013-09-06 DIAGNOSIS — Z951 Presence of aortocoronary bypass graft: Secondary | ICD-10-CM | POA: Insufficient documentation

## 2013-09-06 DIAGNOSIS — Z95810 Presence of automatic (implantable) cardiac defibrillator: Secondary | ICD-10-CM | POA: Insufficient documentation

## 2013-09-06 LAB — BASIC METABOLIC PANEL
BUN: 43 mg/dL — ABNORMAL HIGH (ref 6–23)
Chloride: 104 mEq/L (ref 96–112)
GFR calc Af Amer: 32 mL/min — ABNORMAL LOW (ref >90)
GFR calc non Af Amer: 28 mL/min — ABNORMAL LOW (ref >90)
Glucose, Bld: 114 mg/dL — ABNORMAL HIGH (ref 70–99)
Potassium: 4.7 mEq/L (ref 3.5–5.1)
Sodium: 138 mEq/L (ref 135–145)

## 2013-09-06 LAB — CBC WITH DIFFERENTIAL/PLATELET
Basophils Absolute: 0 10*3/uL (ref 0.0–0.1)
Eosinophils Absolute: 0.2 10*3/uL (ref 0.0–0.7)
Eosinophils Relative: 3 % (ref 0–5)
Hemoglobin: 12.9 g/dL — ABNORMAL LOW (ref 13.0–17.0)
Lymphs Abs: 1.3 10*3/uL (ref 0.7–4.0)
MCV: 89 fL (ref 78.0–100.0)
Monocytes Relative: 10 % (ref 3–12)
Neutro Abs: 5.3 10*3/uL (ref 1.7–7.7)
Neutrophils Relative %: 70 % (ref 43–77)
Platelets: 179 10*3/uL (ref 150–400)
RBC: 4.47 MIL/uL (ref 4.22–5.81)
WBC: 7.6 10*3/uL (ref 4.0–10.5)

## 2013-09-06 MED ORDER — LORAZEPAM 1 MG PO TABS: 2.0000 mg | ORAL_TABLET | Freq: Once | ORAL | Status: DC

## 2013-09-06 MED ORDER — SODIUM CHLORIDE 0.9 % IV BOLUS (SEPSIS): 500.0000 mL | Freq: Once | INTRAVENOUS | Status: DC

## 2013-09-06 MED ORDER — MECLIZINE HCL 25 MG PO TABS: 25.0000 mg | ORAL_TABLET | Freq: Three times a day (TID) | ORAL | Status: DC | PRN

## 2013-09-06 MED ORDER — LORAZEPAM 2 MG/ML IJ SOLN
1.0000 mg | Freq: Once | INTRAMUSCULAR | Status: AC
  Administered 2013-09-06: 1 mg via INTRAVENOUS
  Filled 2013-09-06: qty 1

## 2013-09-06 NOTE — ED Notes (Addendum)
Pt c/o dizziness and bil legs numbness x 2 days denies Cp or weakness , steady gait to triage

## 2013-09-06 NOTE — ED Provider Notes (Signed)
CSN: [id number]     Arrival date & time 09/06/13  1740 History  This chart was scribed for Enid Skeens, MD by Dorothey Baseman, ED Scribe. This patient was seen in room MH09/MH09 and the patient's care was started at 6:52 PM.    Chief Complaint  Patient presents with  . Dizziness   The history is provided by the patient. No language interpreter was used.   HPI Comments: Joseph Hamilton is a 72 y.o. male who presents to the Emergency Department complaining of headache described as "pressure all around the head" and "hot flashes" in the arms and legs.  He states that his symptoms feel like "I'm going to fall over if I don't walk slowly enough." Patient also reports some tinnitus a few days ago that has a since resolved. He reports some associated leg pain that may be baseline for him secondary to his vascular history. He reports that his symptoms began a few days ago and has been progressively worsening. He reports that the symptoms are alleviated by standing still. He denies visual disturbance, neck pain or stiffness, difficulty swallowing, chest pain, shortness of breath. Patient has a history of CHF, CAD, HTN, hyperlipidemia, PE, MI, and DM. Patient takes Coumadin daily. He also reports that he was recently switched from Ambien to Elavil.   Past Medical History  Diagnosis Date  . Coronary artery disease     s/p MI X 2, REDO CABG 2003, MYOVIEW 12/09 EF 28% , EXTENSIVE ANTERIOR, INFERIOR AND LATERAL INFARCT. VERY MILD ANTERIOR ISCHEMIA  . CHF (congestive heart failure)     SECONDARY TO ISCHEMIC CARDIOMYOPATHY ; EF 28% BY MYOVIEW ; 50% BY ECHO 07/2008; ECHO 09/2009 EF "MILD TO MODERATELY REDUCED"  . Hypertension   . Hyperlipidemia   . Dizziness     CHRONIC; s/p EXTENSIVE EVAL, s/p EVAL @ [facility]; HEADACHE:  s/p EXTENSIVE EVAL; ON ELAVIL  . Sleep apnea 03/31/10    UNABLE TO TOLERATE CPAP; > HOME SLEEP TEST AHI 15.6  . Obesity   . Dupuytren contracture   . Nephrolithiasis   . CRI (chronic renal  insufficiency)     BASELINE Cr. 1.7 ----DR. MATTINGLY  . Carotid artery occlusion     0-39% BILATERALLY W/CHRONICALLY OCCLUDED L VERTEBRAL  . Angina   . ICD (implantable cardiac defibrillator) in place   . Pacemaker   . PE (pulmonary embolism) 2005  . Myocardial infarction     x3  . Diabetes mellitus without complication   . GERD (gastroesophageal reflux disease)   . Arthritis    Past Surgical History  Procedure Laterality Date  . Insert / replace / remove pacemaker  2006    s/p GUIDANT IMPLANTABLE CARDIVERTER DEFIBRILLATOR  . Cataract extraction    . Tonsillectomy      AS A CHILD  . Kidney stone surgery    . Coronary artery bypass graft  1995     AND 2003  . Eye surgery    . Pacemaker lead removal  08/28/2012    Procedure: PACEMAKER LEAD REMOVAL;  Surgeon: Marinus Maw, MD;  Location: [facility] OR;  Service: Cardiovascular;  Laterality: N/A;   History reviewed. No pertinent family history. History  Substance Use Topics  . Smoking status: Former Smoker    Types: Pipe    Quit date: 09/25/1993  . Smokeless tobacco: Never Used  . Alcohol Use: 3.5 oz/week    7 drink(s) per week     Comment: 1 DRINK PER DAY  Review of Systems  HENT: Positive for tinnitus ( resolved). Negative for trouble swallowing.   Eyes: Negative for visual disturbance.  Respiratory: Negative for shortness of breath.   Cardiovascular: Negative for chest pain.  Musculoskeletal: Positive for gait problem and myalgias. Negative for neck pain and neck stiffness.  Neurological: Positive for dizziness and headaches.  All other systems reviewed and are negative.    Allergies  Ezetimibe-simvastatin  Home Medications   Current Outpatient Rx  Name  Route  Sig  Dispense  Refill  . amitriptyline (ELAVIL) 25 MG tablet      TAKE ONE TABLET DAILY AT BEDTIME   30 tablet   4   . aspirin EC 81 MG tablet   Oral   Take 1 tablet (81 mg total) by mouth daily.   30 tablet   3   . carvedilol (COREG) 25 MG  tablet      Take 1 tablet in the morning and 1/2 tablet in the evening         . CRESTOR 40 MG tablet      TAKE ONE (1) TABLET EACH DAY   30 tablet   2   . fish oil-omega-3 fatty acids 1000 MG capsule   Oral   Take 1 g by mouth daily.         . furosemide (LASIX) 40 MG tablet   Oral   Take 40 mg by mouth See admin instructions. Patient takes 1 tablet every day with permission to take additional tablet if needed.         . isosorbide mononitrate (IMDUR) 60 MG 24 hr tablet   Oral   Take 1 tablet (60 mg total) by mouth daily.   30 tablet   6   . nateglinide (STARLIX) 60 MG tablet   Oral   Take 60 mg by mouth 2 (two) times daily with a meal. Take with the two largest meals of the day. Pt sometimes forgets to take         . nitroGLYCERIN (NITROSTAT) 0.4 MG SL tablet   Sublingual   Place 1 tablet (0.4 mg total) under the tongue every 5 (five) minutes x 3 doses as needed for chest pain.   25 tablet   3   . omeprazole (PRILOSEC) 40 MG capsule      TAKE ONE (1) CAPSULE EACH DAY   30 capsule   5   . ramipril (ALTACE) 5 MG capsule   Oral   Take 1 capsule (5 mg total) by mouth daily.   30 capsule   6   . warfarin (COUMADIN) 3 MG tablet      Take as directed by the coumadin clinic   45 tablet   3     30-day supply   . zolpidem (AMBIEN CR) 12.5 MG CR tablet   Oral   Take 1 tablet (12.5 mg total) by mouth at bedtime as needed for sleep.   30 tablet   3    Triage Vitals: BP 134/83  Pulse 81  Temp(Src) 97.7 F (36.5 C) (Oral)  Resp 16  Wt 225 lb (102.059 kg)  SpO2 100%  Physical Exam  Nursing note and vitals reviewed. Constitutional: He is oriented to person, place, and time. He appears well-developed and well-nourished. No distress.  HENT:  Head: Normocephalic and atraumatic.  Eyes: Conjunctivae and EOM are normal. Pupils are equal, round, and reactive to light.  No nystagmus.   Neck: Normal range of motion. Neck supple.  Cardiovascular: Normal  rate, regular rhythm and normal heart sounds.  Exam reveals no gallop and no friction rub.   No murmur heard. Pulmonary/Chest: Effort normal and breath sounds normal. No respiratory distress. He has no wheezes. He has no rales.  Abdominal: He exhibits no distension.  Musculoskeletal: Normal range of motion.  Neurological: He is alert and oriented to person, place, and time. No cranial nerve deficit.  Negative pronator drift. Negative Romberg. Normal strength and sensation of bilateral upper extremities. Normal gait.   Skin: Skin is warm and dry.  Psychiatric: He has a normal mood and affect. His behavior is normal.    ED Course  Procedures (including critical care time)  DIAGNOSTIC STUDIES: Oxygen Saturation is 100% on room air, normal by my interpretation.    COORDINATION OF CARE: 6:57 PM- Will order a CT of the head, MRI of the brain, and blood labs. Will order Ativan and IV fluids to manage symptoms. Discussed treatment plan with patient at bedside and patient verbalized agreement.   10:04 PM- Discussed that imaging results were normal. Discussed that labs indicate a slightly elevated blood glucose level, but that it is not cause for emergent concern at this time. Will discharge patient with Antivert to manage symptoms. Discussed treatment plan with patient at bedside and patient verbalized agreement.    Labs Review Labs Reviewed  CBC WITH DIFFERENTIAL - Abnormal; Notable for the following:    Hemoglobin 12.9 (*)    All other components within normal limits  BASIC METABOLIC PANEL - Abnormal; Notable for the following:    Glucose, Bld 114 (*)    BUN 43 (*)    Creatinine, Ser 2.20 (*)    GFR calc non Af Amer 28 (*)    GFR calc Af Amer 32 (*)    All other components within normal limits   Imaging Review Ct Head Wo Contrast  09/06/2013   CLINICAL DATA:  Headache.  Hot flashes.  Tinnitus.  EXAM: CT HEAD WITHOUT CONTRAST  TECHNIQUE: Contiguous axial images were obtained from the  base of the skull through the vertex without contrast.  COMPARISON:  02/13/2006.  FINDINGS: No evidence for acute infarction, hemorrhage, mass lesion, hydrocephalus, or extra-axial fluid. Moderate cerebral and cerebellar atrophy. Chronic microvascular ischemic change. Scattered lacune is, most notable right basal ganglia. Small cerebellar infarcts. Calvarium intact. Vascular calcification. No sinus or mastoid disease. Slight progression of atrophy from priors.  IMPRESSION: No acute intracranial abnormality.  Chronic changes as described.   Electronically Signed   By: Davonna Belling M.D.   On: 09/06/2013 20:11   Mr Brain Wo Contrast  09/06/2013   CLINICAL DATA:  Dizziness.  Gait abnormality.  EXAM: MRI HEAD WITHOUT CONTRAST  TECHNIQUE: Multiplanar, multiecho pulse sequences of the brain and surrounding structures were obtained without intravenous contrast.  COMPARISON:  CT head earlier in the day.  FINDINGS: No evidence for acute infarction, hemorrhage, mass lesion, hydrocephalus, or extra-axial fluid. Moderate cerebral and cerebellar atrophy. Mild subcortical and periventricular T2 and FLAIR hyperintensities, likely chronic microvascular ischemic change. Pituitary, pineal, and cerebellar tonsils unremarkable. No upper cervical lesions. Flow voids are maintained throughout the carotid, basilar, and vertebral arteries. Left vertebral ends in the PICA. There are no areas of chronic hemorrhage. Small remote right cerebellar infarct. Small remote right basal ganglia infarct. Visualized calvarium, skull base, and upper cervical osseous structures unremarkable. Scalp and extracranial soft tissues, orbits, sinuses, and mastoids show no acute process. No change from prior CT.  IMPRESSION: Chronic changes as  described.  No acute stroke or hemorrhage.   Electronically Signed   By: Davonna Belling M.D.   On: 09/06/2013 21:26    EKG Interpretation   None       MDM   1. Vertigo   2. CRF (chronic renal failure)    I  personally performed the services described in this documentation, which was scribed in my presence. The recorded information has been reviewed and is accurate.  Normal neuro exam.  Pt feels mild instability with gait and with vascular hx and vertigo sxs MRI to look for stroke. Ativan prior to MRI. MRI no acute findings. Sxs resolved in ED>  Results and differential diagnosis were discussed with the patient. Close follow up outpatient was discussed, patient comfortable with the plan.     Enid Skeens, MD 09/07/13 [phone]

## 2013-09-08 ENCOUNTER — Telehealth: Payer: Self-pay | Admitting: Pulmonary Disease

## 2013-09-08 NOTE — Telephone Encounter (Signed)
Melissa stated that [facility] will contact patient today to arrange new cpap set up at [facility]. Tried contacting patient on both numbers (mobile and home) had to leave message on both numbers. Advised patient that if he hadn't heard anything from [facility] by tomorrow to let me know. Rhonda J Cobb

## 2013-09-08 NOTE — Telephone Encounter (Signed)
Patient called and stated that he has not heard from [facility] regarding order on 09/01/13. Pt stated that he called [facility] and [facility] told him that they didn't know anything about him. Urgent staff message sent to [facility] to have someone contact him today. Waiting on Melissa's response. Rhonda J Cobb

## 2013-09-09 ENCOUNTER — Telehealth (HOSPITAL_COMMUNITY): Payer: Self-pay | Admitting: Cardiology

## 2013-09-09 NOTE — Telephone Encounter (Signed)
Pt was seen in the Er recently for sever dizziness Ct/Mri scans are all negative, blood work negative Pt would like to know if this is a side effect of some of the meds he is on, if so can we change them   Please advise

## 2013-09-09 NOTE — Telephone Encounter (Signed)
Contacted patient and [facility] contacted patient and arranged for new cpap set up Wed. 09/10/13 at [facility]. Pt is aware of appointment. Rhonda J Cobb

## 2013-09-10 ENCOUNTER — Telehealth: Payer: Self-pay | Admitting: Internal Medicine

## 2013-09-10 ENCOUNTER — Ambulatory Visit (HOSPITAL_COMMUNITY)
Admission: RE | Admit: 2013-09-10 | Discharge: 2013-09-10 | Disposition: A | Discharge status: Home / Self Care | Payer: Medicare Other | Source: Ambulatory Visit | Attending: Internal Medicine | Admitting: Internal Medicine

## 2013-09-10 DIAGNOSIS — I255 Ischemic cardiomyopathy: Secondary | ICD-10-CM

## 2013-09-10 NOTE — Telephone Encounter (Signed)
New message    Had to cancel MRI because he did not get a presc for something to relax him.  Pls reschedule MRI and call in a presc to relax him close to that time

## 2013-09-10 NOTE — Telephone Encounter (Signed)
Spoke w/pt, he states these symtpoms of dizziness has been going for about 1-2 weeks and got really bad Sat, so he went to ER, he states BP has been fine advised don't feel it is from cardiac meds, he is sch to f/u with pcp tomorrow, he states they recently stopped Elavil and increased Starlix, advised to f/u with them he is agreeable

## 2013-09-11 ENCOUNTER — Other Ambulatory Visit: Payer: Self-pay | Admitting: *Deleted

## 2013-09-11 NOTE — Telephone Encounter (Signed)
Handwritten rx left at front desk for one Valium 5mg  to be taken before MRI. Pt instructed to call office tomorrow and reschedule MRI. Pt verbalized understanding and agreeable to plan.

## 2013-09-11 NOTE — Telephone Encounter (Signed)
Follow Up  Pt called states that he will need the MRI resch with a presciption to keep him calm. Requests a MRI before the End of the year// Please assist

## 2013-09-12 ENCOUNTER — Telehealth: Payer: Self-pay | Admitting: Internal Medicine

## 2013-09-12 NOTE — Telephone Encounter (Signed)
Follow Up  Pt returned call to schedule MRI.  Pt prefers not to have an early AM appt// please schedule after 10 am.

## 2013-09-12 NOTE — Telephone Encounter (Signed)
Given to [place] in scheduling.

## 2013-09-16 ENCOUNTER — Ambulatory Visit (HOSPITAL_COMMUNITY)
Admission: RE | Admit: 2013-09-16 | Discharge: 2013-09-16 | Disposition: A | Discharge status: Home / Self Care | Payer: Medicare Other | Source: Ambulatory Visit | Attending: Internal Medicine | Admitting: Internal Medicine

## 2013-09-16 ENCOUNTER — Ambulatory Visit (INDEPENDENT_AMBULATORY_CARE_PROVIDER_SITE_OTHER): Payer: Medicare Other | Admitting: *Deleted

## 2013-09-16 DIAGNOSIS — Z7901 Long term (current) use of anticoagulants: Secondary | ICD-10-CM

## 2013-09-16 DIAGNOSIS — I2589 Other forms of chronic ischemic heart disease: Secondary | ICD-10-CM | POA: Insufficient documentation

## 2013-09-16 DIAGNOSIS — I319 Disease of pericardium, unspecified: Secondary | ICD-10-CM | POA: Insufficient documentation

## 2013-09-16 DIAGNOSIS — I82A19 Acute embolism and thrombosis of unspecified axillary vein: Secondary | ICD-10-CM

## 2013-09-16 DIAGNOSIS — I2699 Other pulmonary embolism without acute cor pulmonale: Secondary | ICD-10-CM

## 2013-09-16 DIAGNOSIS — I255 Ischemic cardiomyopathy: Secondary | ICD-10-CM

## 2013-09-16 DIAGNOSIS — I82A11 Acute embolism and thrombosis of right axillary vein: Secondary | ICD-10-CM

## 2013-09-16 LAB — POCT INR: INR: 3

## 2013-10-02 ENCOUNTER — Telehealth: Payer: Self-pay | Admitting: Pulmonary Disease

## 2013-10-02 DIAGNOSIS — G4733 Obstructive sleep apnea (adult) (pediatric): Secondary | ICD-10-CM

## 2013-10-02 NOTE — Telephone Encounter (Signed)
Spoke with pt. He reports his DME told him his pressure gets as high as 13.5 cm. He reports when it gets to this point it blows the mask off his face. Pt reports he is having a terrible time sleeping and at times he takes naps during the day. He is currently on auto setting per pt. He has a full face mask. I advised will call [facility] to get download faxed over. Also pt wants to change [facility] office's to the company off [address].   I called Melissa and she will get download sent over to [place] to triage fax. Will forward to [place] to follow up on.

## 2013-10-02 NOTE — Telephone Encounter (Signed)
Download has been received. This has been placed in Dr. Evlyn Courier look at. Please advise. Thanks.

## 2013-10-03 NOTE — Telephone Encounter (Signed)
Auto CPAP 09/12/13 to 09/25/13 >> Used on 10 of 14 nights with average 4 hrs 10 min.  Average AHI 8.1 with median CPAP 10 cm H2O and 95 th percentile CPAP 13 cm H2O.  Will have my nurse inform pt that I have sent order to adjust his CPAP to 10 cm H2O.  He should call back if he still has difficulty tolerating CPAP.

## 2013-10-03 NOTE — Telephone Encounter (Signed)
lmtcb x1 

## 2013-10-06 ENCOUNTER — Encounter: Payer: Self-pay | Admitting: Internal Medicine

## 2013-10-06 ENCOUNTER — Ambulatory Visit (INDEPENDENT_AMBULATORY_CARE_PROVIDER_SITE_OTHER): Payer: Medicare Other | Admitting: Internal Medicine

## 2013-10-06 VITALS — BP 128/72 | HR 51 | Ht 69.5 in | Wt 228.0 lb

## 2013-10-06 DIAGNOSIS — I2589 Other forms of chronic ischemic heart disease: Secondary | ICD-10-CM

## 2013-10-06 DIAGNOSIS — Z95810 Presence of automatic (implantable) cardiac defibrillator: Secondary | ICD-10-CM

## 2013-10-06 DIAGNOSIS — I255 Ischemic cardiomyopathy: Secondary | ICD-10-CM

## 2013-10-06 NOTE — Patient Instructions (Signed)
Your physician recommends that you continue on your current medications as directed. Please refer to the Current Medication list given to you today.  Dr. Graciela Husbands will contact you by next week

## 2013-10-06 NOTE — Telephone Encounter (Signed)
Pt is aware that we will be changing his CPAP pressure. Advised him to call back if he has problems with this pressure.

## 2013-10-06 NOTE — Telephone Encounter (Signed)
lmtcb x2 

## 2013-10-06 NOTE — Progress Notes (Signed)
Patient Care Team: [name], MD as PCP - General (Internal Medicine)   HPI  Joseph BattyRobert Brightwell is a 73 y.o. male Seen in followup for ischemic heart disease with a previously implanted ICD for primary prevention. He has had intercurrent normalization of LV systolic function in thus he declined device generator replacement at [facility] last year and the device generator/Leads were removed but not reimplanted    He has a history of coronary artery disease, status post previous myocardial infarction, as well as redo coronary bypass grafting in 2003. He also has congestive heart failure related to an ischemic cardiomyopathy  He suffered another non-STEMI in May 2013: Underwent catheterization demonstrating patent grafts to the distal RCA and the LIMA to the vein grafts occluded. Medical therapy was recommended. Ejection fraction by echo was 40-45%   Most recent ejection fraction was 40% measured by echo 10/14. Over read by Dr. Dorthea CoveB suggested EF 25-30%.    He was is admitted for MRI scanning which confirmed an ejection fraction of about 30%.  He has had problems with intrascapular pain whihch is his anginal equivalent   Past Medical History  Diagnosis Date  . Coronary artery disease     s/p MI X 2, REDO CABG 2003, MYOVIEW 12/09 EF 28% , EXTENSIVE ANTERIOR, INFERIOR AND LATERAL INFARCT. VERY MILD ANTERIOR ISCHEMIA  . CHF (congestive heart failure)     SECONDARY TO ISCHEMIC CARDIOMYOPATHY ; EF 28% BY MYOVIEW ; 50% BY ECHO 07/2008; ECHO 09/2009 EF "MILD TO MODERATELY REDUCED"  . Hypertension   . Hyperlipidemia   . Dizziness     CHRONIC; s/p EXTENSIVE EVAL, s/p EVAL @ [facility]; HEADACHE:  s/p EXTENSIVE EVAL; ON ELAVIL  . Sleep apnea 03/31/10    UNABLE TO TOLERATE CPAP; > HOME SLEEP TEST AHI 15.6  . Obesity   . Dupuytren contracture   . Nephrolithiasis   . CRI (chronic renal insufficiency)     BASELINE Cr. 1.7 ----DR. MATTINGLY  . Carotid artery occlusion     0-39% BILATERALLY  W/CHRONICALLY OCCLUDED L VERTEBRAL  . Angina   . ICD (implantable cardiac defibrillator) in place   . Pacemaker   . PE (pulmonary embolism) 2005  . Myocardial infarction     x3  . Diabetes mellitus without complication   . GERD (gastroesophageal reflux disease)   . Arthritis     Past Surgical History  Procedure Laterality Date  . Insert / replace / remove pacemaker  2006    s/p GUIDANT IMPLANTABLE CARDIVERTER DEFIBRILLATOR  . Cataract extraction    . Tonsillectomy      AS A CHILD  . Kidney stone surgery    . Coronary artery bypass graft  1995     AND 2003  . Eye surgery    . Pacemaker lead removal  08/28/2012    Procedure: PACEMAKER LEAD REMOVAL;  Surgeon: Marinus MawGregg W Taylor, MD;  [facility];  Service: Cardiovascular;  Laterality: N/A;    Current Outpatient Prescriptions  Medication Sig Dispense Refill  . aspirin EC 81 MG tablet Take 1 tablet (81 mg total) by mouth daily.  30 tablet  3  . carvedilol (COREG) 25 MG tablet Take 1 tablet in the morning and 1/2 tablet in the evening      . CRESTOR 40 MG tablet TAKE ONE (1) TABLET EACH DAY  30 tablet  2  . fish oil-omega-3 fatty acids 1000 MG capsule Take 1 g by mouth daily.      .Marland Kitchen  furosemide (LASIX) 40 MG tablet Take 40 mg by mouth See admin instructions. Patient takes 1 tablet every day with permission to take additional tablet if needed.      . isosorbide mononitrate (IMDUR) 60 MG 24 hr tablet Take 1 tablet (60 mg total) by mouth daily.  30 tablet  6  . nateglinide (STARLIX) 60 MG tablet Take 60 mg by mouth 3 (three) times daily with meals. Take with the two largest meals of the day. Pt sometimes forgets to take      . nitroGLYCERIN (NITROSTAT) 0.4 MG SL tablet Place 1 tablet (0.4 mg total) under the tongue every 5 (five) minutes x 3 doses as needed for chest pain.  25 tablet  3  . omeprazole (PRILOSEC) 40 MG capsule TAKE ONE (1) CAPSULE EACH DAY  30 capsule  5  . ramipril (ALTACE) 5 MG capsule Take 1 capsule (5 mg total) by mouth  daily.  30 capsule  6  . warfarin (COUMADIN) 3 MG tablet Take as directed by the coumadin clinic  45 tablet  3  . zolpidem (AMBIEN CR) 12.5 MG CR tablet Take 1 tablet (12.5 mg total) by mouth at bedtime as needed for sleep.  30 tablet  3   No current facility-administered medications for this visit.    Allergies  Allergen Reactions  . Ezetimibe-Simvastatin     REACTION: myalgia: TOLERATES PRAVOCHOL OK    Review of Systems negative except from HPI and PMH  Physical Exam BP 128/72  Pulse 51  Ht 5' 9.5" (1.765 m)  Wt 228 lb (103.42 kg)  BMI 33.20 kg/m2 Well developed and well nourished in no acute distress HENT normal E scleral and icterus clear Neck Supple JVP flat; carotids brisk and full Clear to ausculation  Regular rate and rhythm, no murmurs gallops or rub Soft with active bowel sounds No clubbing cyanosis no Edema Alert and oriented, grossly normal motor and sensory function Skin Warm and Dry    Assessment and  Plan

## 2013-10-07 ENCOUNTER — Encounter: Payer: Self-pay | Admitting: *Deleted

## 2013-10-07 ENCOUNTER — Telehealth: Payer: Self-pay | Admitting: *Deleted

## 2013-10-07 ENCOUNTER — Other Ambulatory Visit: Payer: Self-pay | Admitting: *Deleted

## 2013-10-07 ENCOUNTER — Encounter (HOSPITAL_COMMUNITY): Payer: Self-pay | Admitting: Pharmacy Technician

## 2013-10-07 DIAGNOSIS — I428 Other cardiomyopathies: Secondary | ICD-10-CM

## 2013-10-07 DIAGNOSIS — Z01812 Encounter for preprocedural laboratory examination: Secondary | ICD-10-CM

## 2013-10-07 NOTE — Assessment & Plan Note (Signed)
The patient has had recurrent CP and has modest worsening of LV function.  The issue is whether he is appropriately considered for ICD reimplantation following his decision last year to explant.  With worsening symptpoms suggestive of angina, it is appropriate to consider repeat cath.  Dr [facility] has reviewed the films from his last cath and htought appropriate to repeat cath  Dr DB was also in agreement  In the event that the cath is unrevealing medical therapy with either spironlactone or hydralazine added to imdur with reassessment of LV function  We spent more than 50% of 50 min of time in consultation

## 2013-10-07 NOTE — Telephone Encounter (Signed)
Called patient to schedule cardiac cath per Dr. Graciela Husbands. Cath scheduled 1/19 w/ Dr. Excell Seltzer at 12:00. Pre-procedure labs on 1/15. Pt agreeable to plan.

## 2013-10-09 ENCOUNTER — Telehealth: Payer: Self-pay | Admitting: Nurse Practitioner

## 2013-10-09 ENCOUNTER — Other Ambulatory Visit (INDEPENDENT_AMBULATORY_CARE_PROVIDER_SITE_OTHER): Payer: Medicare Other

## 2013-10-09 DIAGNOSIS — I428 Other cardiomyopathies: Secondary | ICD-10-CM

## 2013-10-09 DIAGNOSIS — Z01812 Encounter for preprocedural laboratory examination: Secondary | ICD-10-CM

## 2013-10-09 LAB — CBC WITH DIFFERENTIAL/PLATELET
BASOS ABS: 0 10*3/uL (ref 0.0–0.1)
Basophils Relative: 0.5 % (ref 0.0–3.0)
EOS ABS: 0.1 10*3/uL (ref 0.0–0.7)
Eosinophils Relative: 2.2 % (ref 0.0–5.0)
HEMATOCRIT: 36.8 % — AB (ref 39.0–52.0)
HEMOGLOBIN: 12.3 g/dL — AB (ref 13.0–17.0)
LYMPHS ABS: 1 10*3/uL (ref 0.7–4.0)
Lymphocytes Relative: 15.1 % (ref 12.0–46.0)
MCHC: 33.3 g/dL (ref 30.0–36.0)
MCV: 87 fl (ref 78.0–100.0)
Monocytes Absolute: 0.7 10*3/uL (ref 0.1–1.0)
Monocytes Relative: 11 % (ref 3.0–12.0)
Neutro Abs: 4.6 10*3/uL (ref 1.4–7.7)
Neutrophils Relative %: 71.2 % (ref 43.0–77.0)
Platelets: 184 10*3/uL (ref 150.0–400.0)
RBC: 4.23 Mil/uL (ref 4.22–5.81)
RDW: 15.5 % — AB (ref 11.5–14.6)
WBC: 6.5 10*3/uL (ref 4.5–10.5)

## 2013-10-09 LAB — PROTIME-INR
INR: 3.5 ratio — ABNORMAL HIGH (ref 0.8–1.0)
Prothrombin Time: 35.9 s — ABNORMAL HIGH (ref 10.2–12.4)

## 2013-10-09 LAB — BASIC METABOLIC PANEL
BUN: 41 mg/dL — AB (ref 6–23)
CO2: 24 meq/L (ref 19–32)
Calcium: 9 mg/dL (ref 8.4–10.5)
Chloride: 108 mEq/L (ref 96–112)
Creatinine, Ser: 2.2 mg/dL — ABNORMAL HIGH (ref 0.4–1.5)
GFR: 30.81 mL/min — ABNORMAL LOW (ref >60.00)
GLUCOSE: 105 mg/dL — AB (ref 70–99)
POTASSIUM: 4.9 meq/L (ref 3.5–5.1)
SODIUM: 140 meq/L (ref 135–145)

## 2013-10-09 NOTE — Telephone Encounter (Signed)
Patient in the office for pre-cath labs requesting instructions on Coumadin dosing in preparation for cardiac cath on Monday, Jan. 19.  I advised patient to stop Coumadin today and resume Monday after the catheterization per Cicero Duck, RN in [facility].  Patient's coumadin clinic appointment changed from 1/20 to 1/26.  Patient verbalized understanding of instructions and appointment change. I advised patient that we will call if there is a change based on patient's INR taken today.  Patient verbalized understanding and agreement.

## 2013-10-12 ENCOUNTER — Other Ambulatory Visit: Payer: Self-pay | Admitting: Cardiovascular Disease

## 2013-10-12 DIAGNOSIS — I2510 Atherosclerotic heart disease of native coronary artery without angina pectoris: Secondary | ICD-10-CM

## 2013-10-13 ENCOUNTER — Encounter (HOSPITAL_COMMUNITY)
Admission: RE | Disposition: A | Discharge status: Home / Self Care | Payer: Self-pay | Source: Ambulatory Visit | Attending: Cardiovascular Disease

## 2013-10-13 ENCOUNTER — Encounter (HOSPITAL_COMMUNITY): Payer: Self-pay | Admitting: *Deleted

## 2013-10-13 ENCOUNTER — Ambulatory Visit (HOSPITAL_COMMUNITY)
Admission: RE | Admit: 2013-10-13 | Discharge: 2013-10-14 | Disposition: A | Discharge status: Home / Self Care | Payer: Medicare Other | Source: Ambulatory Visit | Attending: Cardiovascular Disease | Admitting: Cardiovascular Disease

## 2013-10-13 DIAGNOSIS — I200 Unstable angina: Secondary | ICD-10-CM

## 2013-10-13 DIAGNOSIS — K219 Gastro-esophageal reflux disease without esophagitis: Secondary | ICD-10-CM | POA: Diagnosis present

## 2013-10-13 DIAGNOSIS — I2589 Other forms of chronic ischemic heart disease: Secondary | ICD-10-CM | POA: Insufficient documentation

## 2013-10-13 DIAGNOSIS — I5022 Chronic systolic (congestive) heart failure: Secondary | ICD-10-CM | POA: Diagnosis present

## 2013-10-13 DIAGNOSIS — E119 Type 2 diabetes mellitus without complications: Secondary | ICD-10-CM | POA: Diagnosis present

## 2013-10-13 DIAGNOSIS — I129 Hypertensive chronic kidney disease with stage 1 through stage 4 chronic kidney disease, or unspecified chronic kidney disease: Secondary | ICD-10-CM | POA: Insufficient documentation

## 2013-10-13 DIAGNOSIS — E669 Obesity, unspecified: Secondary | ICD-10-CM | POA: Insufficient documentation

## 2013-10-13 DIAGNOSIS — I252 Old myocardial infarction: Secondary | ICD-10-CM | POA: Insufficient documentation

## 2013-10-13 DIAGNOSIS — I25810 Atherosclerosis of coronary artery bypass graft(s) without angina pectoris: Secondary | ICD-10-CM

## 2013-10-13 DIAGNOSIS — I209 Angina pectoris, unspecified: Secondary | ICD-10-CM | POA: Insufficient documentation

## 2013-10-13 DIAGNOSIS — N183 Chronic kidney disease, stage 3 unspecified: Secondary | ICD-10-CM

## 2013-10-13 DIAGNOSIS — R42 Dizziness and giddiness: Secondary | ICD-10-CM | POA: Insufficient documentation

## 2013-10-13 DIAGNOSIS — Z86711 Personal history of pulmonary embolism: Secondary | ICD-10-CM

## 2013-10-13 DIAGNOSIS — I2510 Atherosclerotic heart disease of native coronary artery without angina pectoris: Secondary | ICD-10-CM | POA: Diagnosis present

## 2013-10-13 DIAGNOSIS — I6529 Occlusion and stenosis of unspecified carotid artery: Secondary | ICD-10-CM | POA: Insufficient documentation

## 2013-10-13 DIAGNOSIS — Z95810 Presence of automatic (implantable) cardiac defibrillator: Secondary | ICD-10-CM | POA: Insufficient documentation

## 2013-10-13 DIAGNOSIS — I509 Heart failure, unspecified: Secondary | ICD-10-CM | POA: Insufficient documentation

## 2013-10-13 DIAGNOSIS — I255 Ischemic cardiomyopathy: Secondary | ICD-10-CM | POA: Diagnosis present

## 2013-10-13 DIAGNOSIS — Z7901 Long term (current) use of anticoagulants: Secondary | ICD-10-CM

## 2013-10-13 DIAGNOSIS — G4733 Obstructive sleep apnea (adult) (pediatric): Secondary | ICD-10-CM | POA: Diagnosis present

## 2013-10-13 DIAGNOSIS — G4730 Sleep apnea, unspecified: Secondary | ICD-10-CM | POA: Insufficient documentation

## 2013-10-13 DIAGNOSIS — E785 Hyperlipidemia, unspecified: Secondary | ICD-10-CM | POA: Diagnosis present

## 2013-10-13 DIAGNOSIS — N259 Disorder resulting from impaired renal tubular function, unspecified: Secondary | ICD-10-CM

## 2013-10-13 DIAGNOSIS — Z951 Presence of aortocoronary bypass graft: Secondary | ICD-10-CM | POA: Insufficient documentation

## 2013-10-13 DIAGNOSIS — Z9889 Other specified postprocedural states: Secondary | ICD-10-CM

## 2013-10-13 DIAGNOSIS — E782 Mixed hyperlipidemia: Secondary | ICD-10-CM | POA: Insufficient documentation

## 2013-10-13 HISTORY — DX: Cardiac murmur, unspecified: R01.1

## 2013-10-13 HISTORY — DX: Adverse effect of unspecified anesthetic, initial encounter: T41.45XA

## 2013-10-13 HISTORY — PX: CORONARY ANGIOPLASTY WITH STENT PLACEMENT: SHX49

## 2013-10-13 HISTORY — DX: Chronic kidney disease, stage 3 (moderate): N18.3

## 2013-10-13 HISTORY — PX: PERCUTANEOUS STENT INTERVENTION: SHX5500

## 2013-10-13 HISTORY — DX: Acute embolism and thrombosis of unspecified deep veins of unspecified lower extremity: I82.409

## 2013-10-13 HISTORY — DX: Ischemic cardiomyopathy: I25.5

## 2013-10-13 HISTORY — DX: Other complications of anesthesia, initial encounter: T88.59XA

## 2013-10-13 HISTORY — DX: Presence of automatic (implantable) cardiac defibrillator: Z95.810

## 2013-10-13 HISTORY — DX: Chronic systolic (congestive) heart failure: I50.22

## 2013-10-13 HISTORY — PX: LEFT HEART CATHETERIZATION WITH CORONARY/GRAFT ANGIOGRAM: SHX5450

## 2013-10-13 HISTORY — DX: Type 2 diabetes mellitus without complications: E11.9

## 2013-10-13 HISTORY — DX: Chronic kidney disease, stage 3 unspecified: N18.30

## 2013-10-13 LAB — POCT ACTIVATED CLOTTING TIME
Activated Clotting Time: 227 seconds
Activated Clotting Time: 304 seconds

## 2013-10-13 LAB — BASIC METABOLIC PANEL
BUN: 41 mg/dL — AB (ref 6–23)
CHLORIDE: 108 meq/L (ref 96–112)
CO2: 22 meq/L (ref 19–32)
CREATININE: 2.29 mg/dL — AB (ref 0.50–1.35)
Calcium: 9.3 mg/dL (ref 8.4–10.5)
GFR calc Af Amer: 31 mL/min — ABNORMAL LOW (ref >90)
GFR calc non Af Amer: 27 mL/min — ABNORMAL LOW (ref >90)
GLUCOSE: 132 mg/dL — AB (ref 70–99)
Potassium: 5.2 mEq/L (ref 3.7–5.3)
Sodium: 143 mEq/L (ref 137–147)

## 2013-10-13 LAB — PROTIME-INR
INR: 1.5 — AB (ref 0.00–1.49)
Prothrombin Time: 17.7 seconds — ABNORMAL HIGH (ref 11.6–15.2)

## 2013-10-13 LAB — GLUCOSE, CAPILLARY
Glucose-Capillary: 123 mg/dL — ABNORMAL HIGH (ref 70–99)
Glucose-Capillary: 93 mg/dL (ref 70–99)
Glucose-Capillary: 164 mg/dL — ABNORMAL HIGH (ref 70–99)

## 2013-10-13 SURGERY — PERCUTANEOUS STENT INTERVENTION

## 2013-10-13 MED ORDER — SODIUM CHLORIDE 0.9 % IJ SOLN: 3.0000 mL | Freq: Two times a day (BID) | INTRAMUSCULAR | Status: DC

## 2013-10-13 MED ORDER — SODIUM CHLORIDE 0.9 % IJ SOLN: 3.0000 mL | INTRAMUSCULAR | Status: DC | PRN

## 2013-10-13 MED ORDER — FENTANYL CITRATE 0.05 MG/ML IJ SOLN
INTRAMUSCULAR | Status: AC
  Filled 2013-10-13: qty 2

## 2013-10-13 MED ORDER — HEPARIN (PORCINE) IN NACL 2-0.9 UNIT/ML-% IJ SOLN
INTRAMUSCULAR | Status: AC
  Filled 2013-10-13: qty 1000

## 2013-10-13 MED ORDER — ENOXAPARIN SODIUM 100 MG/ML ~~LOC~~ SOLN
100.0000 mg | Freq: Two times a day (BID) | SUBCUTANEOUS | Status: DC
  Administered 2013-10-14: 100 mg via SUBCUTANEOUS
  Filled 2013-10-13 (×3): qty 1

## 2013-10-13 MED ORDER — DIAZEPAM 5 MG PO TABS
5.0000 mg | ORAL_TABLET | Freq: Once | ORAL | Status: AC
  Administered 2013-10-13: 5 mg via ORAL

## 2013-10-13 MED ORDER — SODIUM CHLORIDE 0.9 % IV SOLN
INTRAVENOUS | Status: DC
  Administered 2013-10-13: 100 mL/h via INTRAVENOUS

## 2013-10-13 MED ORDER — ONDANSETRON HCL 4 MG/2ML IJ SOLN: 4.0000 mg | Freq: Four times a day (QID) | INTRAMUSCULAR | Status: DC | PRN

## 2013-10-13 MED ORDER — DIAZEPAM 5 MG PO TABS
ORAL_TABLET | ORAL | Status: AC
  Administered 2013-10-13: 5 mg via ORAL
  Filled 2013-10-13: qty 1

## 2013-10-13 MED ORDER — ATORVASTATIN CALCIUM 40 MG PO TABS
40.0000 mg | ORAL_TABLET | Freq: Every day | ORAL | Status: DC
Start: 2013-10-13 — End: 2013-10-14
  Administered 2013-10-13: 18:00:00 40 mg via ORAL
  Filled 2013-10-13 (×2): qty 1

## 2013-10-13 MED ORDER — ZOLPIDEM TARTRATE 5 MG PO TABS
5.0000 mg | ORAL_TABLET | Freq: Every evening | ORAL | Status: DC | PRN
  Administered 2013-10-13: 23:00:00 5 mg via ORAL
  Filled 2013-10-13: qty 1

## 2013-10-13 MED ORDER — WARFARIN - PHARMACIST DOSING INPATIENT: Freq: Every day | Status: DC

## 2013-10-13 MED ORDER — FUROSEMIDE 40 MG PO TABS
40.0000 mg | ORAL_TABLET | Freq: Every day | ORAL | Status: DC
  Administered 2013-10-14: 10:00:00 40 mg via ORAL
  Filled 2013-10-13: qty 1

## 2013-10-13 MED ORDER — CLOPIDOGREL BISULFATE 300 MG PO TABS
ORAL_TABLET | ORAL | Status: AC
  Filled 2013-10-13: qty 1

## 2013-10-13 MED ORDER — NITROGLYCERIN 0.2 MG/ML ON CALL CATH LAB
INTRAVENOUS | Status: AC
  Filled 2013-10-13: qty 1

## 2013-10-13 MED ORDER — ASPIRIN 81 MG PO CHEW
81.0000 mg | CHEWABLE_TABLET | Freq: Every day | ORAL | Status: DC
  Administered 2013-10-14: 10:00:00 81 mg via ORAL
  Filled 2013-10-13: qty 1

## 2013-10-13 MED ORDER — OXYCODONE-ACETAMINOPHEN 5-325 MG PO TABS: 1.0000 | ORAL_TABLET | ORAL | Status: DC | PRN

## 2013-10-13 MED ORDER — NATEGLINIDE 60 MG PO TABS
60.0000 mg | ORAL_TABLET | Freq: Three times a day (TID) | ORAL | Status: DC
  Administered 2013-10-13 – 2013-10-14 (×2): 60 mg via ORAL
  Filled 2013-10-13 (×6): qty 1

## 2013-10-13 MED ORDER — SODIUM CHLORIDE 0.9 % IV SOLN
1.0000 mL/kg/h | INTRAVENOUS | Status: AC
  Administered 2013-10-13: 16:00:00 1 mL/kg/h via INTRAVENOUS

## 2013-10-13 MED ORDER — NITROGLYCERIN 0.4 MG SL SUBL: 0.4000 mg | SUBLINGUAL_TABLET | SUBLINGUAL | Status: DC | PRN

## 2013-10-13 MED ORDER — SODIUM CHLORIDE 0.9 % IV SOLN: 250.0000 mL | INTRAVENOUS | Status: DC | PRN

## 2013-10-13 MED ORDER — HEPARIN SODIUM (PORCINE) 1000 UNIT/ML IJ SOLN
INTRAMUSCULAR | Status: AC
  Filled 2013-10-13: qty 1

## 2013-10-13 MED ORDER — MIDAZOLAM HCL 2 MG/2ML IJ SOLN
INTRAMUSCULAR | Status: AC
  Filled 2013-10-13: qty 2

## 2013-10-13 MED ORDER — WARFARIN SODIUM 10 MG PO TABS
10.0000 mg | ORAL_TABLET | Freq: Once | ORAL | Status: AC
  Administered 2013-10-13: 18:00:00 10 mg via ORAL
  Filled 2013-10-13: qty 1

## 2013-10-13 MED ORDER — FUROSEMIDE 40 MG PO TABS: 40.0000 mg | ORAL_TABLET | ORAL | Status: DC

## 2013-10-13 MED ORDER — CARVEDILOL 12.5 MG PO TABS
12.5000 mg | ORAL_TABLET | Freq: Two times a day (BID) | ORAL | Status: DC
  Administered 2013-10-13 – 2013-10-14 (×2): 12.5 mg via ORAL
  Filled 2013-10-13 (×3): qty 2

## 2013-10-13 MED ORDER — ASPIRIN 81 MG PO CHEW
81.0000 mg | CHEWABLE_TABLET | ORAL | Status: AC
  Administered 2013-10-13: 81 mg via ORAL
  Filled 2013-10-13: qty 1

## 2013-10-13 MED ORDER — ACETAMINOPHEN 325 MG PO TABS
650.0000 mg | ORAL_TABLET | ORAL | Status: DC | PRN
Start: 2013-10-13 — End: 2013-10-14
  Administered 2013-10-13: 650 mg via ORAL
  Filled 2013-10-13: qty 2

## 2013-10-13 MED ORDER — ISOSORBIDE MONONITRATE ER 60 MG PO TB24
60.0000 mg | ORAL_TABLET | Freq: Every day | ORAL | Status: DC
  Administered 2013-10-14: 10:00:00 60 mg via ORAL
  Filled 2013-10-13: qty 1

## 2013-10-13 MED ORDER — CLOPIDOGREL BISULFATE 75 MG PO TABS
75.0000 mg | ORAL_TABLET | Freq: Every day | ORAL | Status: DC
  Administered 2013-10-14: 75 mg via ORAL
  Filled 2013-10-13: qty 1

## 2013-10-13 MED ORDER — LIDOCAINE HCL (PF) 1 % IJ SOLN
INTRAMUSCULAR | Status: AC
  Filled 2013-10-13: qty 30

## 2013-10-13 NOTE — Interval H&P Note (Signed)
History and Physical Interval Note:  10/13/2013 2:05 PM  Joseph Hamilton  has presented today for surgery, with the diagnosis of acm  The various methods of treatment have been discussed with the patient and family. After consideration of risks, benefits and other options for treatment, the patient has consented to  Procedure(s): LEFT HEART CATHETERIZATION WITH CORONARY ANGIOGRAM (N/A) as a surgical intervention .  The patient's history has been reviewed, patient examined, no change in status, stable for surgery.  I have reviewed the patient's chart and labs.  Questions were answered to the patient's satisfaction.    Cath Lab Visit (complete for each Cath Lab visit)  Clinical Evaluation Leading to the Procedure:   ACS: no  Non-ACS:    Anginal Classification: CCS III  Anti-ischemic medical therapy: Maximal Therapy (2 or more classes of medications)  Non-Invasive Test Results: No non-invasive testing performed  Prior CABG: Previous CABG       Tonny Bollman

## 2013-10-13 NOTE — CV Procedure (Addendum)
Cardiac Catheterization Procedure Note  Name: Joseph Hamilton MRN: [id number] DOB: 1941/04/30  Procedure: Left Heart Cath, Selective Coronary Angiography, saphenous vein graft angiography, LIMA angiography, PTCA and stenting of the saphenous vein graft RCA using distal embolic protection  Indication: CCS class III angina in patient with extensive CAD status post redo CABG. Also with ischemic cardiomyopathy and decline in left ventricular function.  Procedural Details:  The left wrist was prepped, draped, and anesthetized with 1% lidocaine. Using the modified Seldinger technique, a 5/6 French sheath was introduced into the right radial artery. 3 mg of verapamil was administered through the sheath, weight-based unfractionated heparin was administered intravenously. Standard Judkins catheters were used for selective coronary angiography, saphenous vein graft angiography, and LIMA angiography. Left ventriculography was deferred because of chronic kidney disease.  Catheter exchanges were performed over an exchange length guidewire.  PROCEDURAL FINDINGS Hemodynamics: AO 119/71 with a mean of 91 LV 122/19   Coronary angiography: Coronary dominance: right  Left mainstem: The left main is heavily calcified. It divides into the LAD and left circumflex. There is diffuse 50% stenosis.  Left anterior descending (LAD): The LAD is totally occluded at the first septal perforator.  Left circumflex (LCx): The circumflex is extensively diseased with heavy calcification. The vessel is totally occluded in its proximal aspect. There is an intermediate branch with continued patency and mild diffuse nonobstructive stenosis of 30-40%.  Right coronary artery (RCA): The RCA is severely calcified. There is 90% ostial stenosis. The mid vessel is totally occluded at the first RV marginal branch.  Saphenous vein graft obtuse marginal: Total occlusion at the aortic anastomosis unchanged from the previous  study  Saphenous vein graft to diagonal: Not selectively injected, known to be totally occluded  Saphenous vein graft to distal RCA: There is an eccentric 80% stenosis in the proximal body the graft. The distal insertion site is patent. The graft is moderately calcified throughout.  LIMA to LAD: The LIMA graft is tortuous, but widely patent without significant stenosis. The LAD after the LIMA insertion site is patent.  Left ventriculography: Deferred because of chronic kidney disease PCI Note:  Following the diagnostic procedure, the decision was made to proceed with PCI.  Additional heparin was given for anticoagulation. There was significant progression of the disease in the saphenous vein graft to RCA. I elected to proceed at high because we had used very little contrast during the procedure and the patient has had pulmonary emboli requiring long-term anticoagulation. I felt the safest way to proceed was with a single interruption in his warfarin rather than putting him through repeated invasive procedures. Once a therapeutic ACT was achieved, a 6 [place] multipurpose guide catheter was inserted.  A cougar coronary guidewire was used to cross the lesion. A 5 mm spider distal embolic protection device was then advanced into the distal body of the graft without difficulty. The lesion was predilated with a 3.0 mm balloon.  The lesion was then stented with a 4.0 x 23 mm Xience Alpine drug-eluting stent.  The distal embolic protection device was removed without difficulty.  Following PCI, there was 0% residual stenosis and TIMI-3 flow. Final angiography confirmed an excellent result. The patient tolerated the procedure well. There were no immediate procedural complications. A TR band was used for radial hemostasis. The patient was transferred to the post catheterization recovery area for further monitoring.  PCI Data: Vessel - distal RCA/Segment - proximal body of saphenous vein graft Percent Stenosis  (pre)  80 TIMI-flow 3  Stent 4.0x23 mm Xience Alpine Percent Stenosis (post) 0 TIMI-flow (post) 3  Final Conclusions:   1. Severe native three-vessel coronary artery disease 2. Status post 2 previous CABG surgeries with continued patency of the LIMA to LAD and severe stenosis in the saphenous vein graft to distal RCA treated successfully with PCI using a single drug-eluting stent. All other grafts occluded.   Recommendations:  Triple anticoagulant Rx for 3-6 months with warfarin, plavix, and ASA 81 mg, followed by continued treatment with low-dose ASA and warfarin. Will try to arrange for lovenox at discharge since the patient has had recurrent pulmonary emboli when off anticoagulation in the past.   Tonny BollmanMichael Landra Howze 10/13/2013, 3:33 PM

## 2013-10-13 NOTE — Progress Notes (Signed)
TR band removal process began at 1700.  At 1730 with 3 cc of air remaining in the TR Band, bleeding was noted from puncture site.  6 cc of air was incrementally added to the TR Band. Hemostasis was achieved.  Site remains a level 0

## 2013-10-13 NOTE — Progress Notes (Addendum)
ANTICOAGULATION CONSULT NOTE - Initial Consult  Pharmacy Consult for Lovenox/ Coumadin Indication: Hx PE  Allergies  Allergen Reactions  . Ezetimibe-Simvastatin     REACTION: myalgia: TOLERATES PRAVOCHOL OK    Patient Measurements: Height: 5' 9.5" (176.5 cm) Weight: 228 lb (103.42 kg) IBW/kg (Calculated) : 71.85 Heparin Dosing Weight: n/a  Vital Signs: Temp: 97.4 F (36.3 C) (01/19 1026) Temp src: Oral (01/19 1026) BP: 118/68 mmHg (01/19 1026) Pulse Rate: 72 (01/19 1414)  Labs:  Recent Labs  10/13/13 1057  LABPROT 17.7*  INR 1.50*  CREATININE 2.29*    Estimated Creatinine Clearance: 34.3 ml/min (by C-G formula based on Cr of 2.29).   Medical History: Past Medical History  Diagnosis Date  . Coronary artery disease     s/p MI X 2, REDO CABG 2003, MYOVIEW 12/09 EF 28% , EXTENSIVE ANTERIOR, INFERIOR AND LATERAL INFARCT. VERY MILD ANTERIOR ISCHEMIA  . CHF (congestive heart failure)     SECONDARY TO ISCHEMIC CARDIOMYOPATHY ; EF 28% BY MYOVIEW ; 50% BY ECHO 07/2008; ECHO 09/2009 EF "MILD TO MODERATELY REDUCED"  . Hypertension   . Hyperlipidemia   . Dizziness     CHRONIC; s/p EXTENSIVE EVAL, s/p EVAL @ [facility]; HEADACHE:  s/p EXTENSIVE EVAL; ON ELAVIL  . Sleep apnea 03/31/10    UNABLE TO TOLERATE CPAP; > HOME SLEEP TEST AHI 15.6  . Obesity   . Dupuytren contracture   . Nephrolithiasis   . CRI (chronic renal insufficiency)     BASELINE Cr. 1.7 ----DR. MATTINGLY  . Carotid artery occlusion     0-39% BILATERALLY W/CHRONICALLY OCCLUDED L VERTEBRAL  . Angina   . ICD (implantable cardiac defibrillator) in place   . Pacemaker   . PE (pulmonary embolism) 2005  . Myocardial infarction     x3  . Diabetes mellitus without complication   . GERD (gastroesophageal reflux disease)   . Arthritis     Medications:  Scheduled:   Assessment: 73 yo male on chronic Coumadin for hx PE admitted for cardiac cath, and Coumadin held.  Found with severe native 3-V CAD, and continued  patency of LIMA to LAD, and severe stenosis in the saphenous vein graft to distal RCA treated successfully with PCI using a single drug-eluting stent. All other grafts occluded.  Planning triple anticoagulants with warfarin, ASA and Plavix.  Pharmacy also asked to bridge with Lovenox while INR subtherapeutic (starting tomorrow).  Wt = 103.4 kg, and CrCl ~ 34 ml/min.  Goal of Therapy:  Anti-Xa level 0.6-1 units/ml 4hrs after LMWH dose given INR 2-3 Monitor platelets by anticoagulation protocol: Yes   Plan:  1. Lovenox 100 mg sq BID. 2. Coumadin 10 mg po x 1 tonight (per MD request). 3. CBC q 72 hrs while on Lovenox. 4. Daily PT/INR.  Tad Moore, BCPS  Clinical Pharmacist Pager [phone]  10/13/2013 4:07 PM

## 2013-10-13 NOTE — Care Management Note (Addendum)
    Page 1 of 1   10/14/2013     10:48:58 AM   CARE MANAGEMENT NOTE 10/14/2013  Patient:  SIRIUS, LUMPP   Account Number:  [id number]  Date Initiated:  10/13/2013  Documentation initiated by:  Oletta Cohn  Subjective/Objective Assessment:   73 y.o. male Seen in followup for ischemic heart disease with a previously implanted ICD for primary prevention.//Home alone     Action/Plan:   LEFT HEART CATHETERIZATION WITH CORONARY ANGIOGRAM as a surgical intervention //Home with self care; benefits check for Lovenox 100mg  sq BID.   Anticipated DC Date:  10/14/2013   Anticipated DC Plan:  HOME/SELF CARE      DC Planning Services  CM consult  Medication Assistance      Choice offered to / List presented to:             Status of service:   Medicare Important Message given?   (If response is "NO", the following Medicare IM given date fields will be blank) Date Medicare IM given:   Date Additional Medicare IM given:    Discharge Disposition:    Per UR Regulation:    If discussed at Long Length of Stay Meetings, dates discussed:    Comments:  10/14/13 1045 Omarii Scalzo, RN, BSN, [place] [phone] COPAY WILL WILL BE $69- GENERIC WILL BE $8  10/13/13 1630 Deira Shimer, RN, BSN, [employer] [phone] Obtaining benefits check for Lovenox 100 mg sq BID.  Pt utilizes [facility] in [place] for prescription needs.  NCM called pharmacy to confirm availability of medication.  Information relayed to pt.  Pt verbalizes importance of filling medication upon discharge. Awaitng results of benefits check Contact Information:  Apple,Jaime Daughter [phone]

## 2013-10-13 NOTE — H&P (View-Only) (Signed)
    Patient Care Team: George Osei-Bonsu, MD as PCP - General (Internal Medicine)   HPI  Joseph Hamilton is a 73 y.o. male Seen in followup for ischemic heart disease with a previously implanted ICD for primary prevention. He has had intercurrent normalization of LV systolic function in thus he declined device generator replacement at [facility] last year and the device generator/Leads were removed but not reimplanted    He has a history of coronary artery disease, status post previous myocardial infarction, as well as redo coronary bypass grafting in 2003. He also has congestive heart failure related to an ischemic cardiomyopathy  He suffered another non-STEMI in May 2013: Underwent catheterization demonstrating patent grafts to the distal RCA and the LIMA to the vein grafts occluded. Medical therapy was recommended. Ejection fraction by echo was 40-45%   Most recent ejection fraction was 40% measured by echo 10/14. Over read by Dr. DB suggested EF 25-30%.    He was is admitted for MRI scanning which confirmed an ejection fraction of about 30%.  He has had problems with intrascapular pain whihch is his anginal equivalent   Past Medical History  Diagnosis Date  . Coronary artery disease     s/p MI X 2, REDO CABG 2003, MYOVIEW 12/09 EF 28% , EXTENSIVE ANTERIOR, INFERIOR AND LATERAL INFARCT. VERY MILD ANTERIOR ISCHEMIA  . CHF (congestive heart failure)     SECONDARY TO ISCHEMIC CARDIOMYOPATHY ; EF 28% BY MYOVIEW ; 50% BY ECHO 07/2008; ECHO 09/2009 EF "MILD TO MODERATELY REDUCED"  . Hypertension   . Hyperlipidemia   . Dizziness     CHRONIC; s/p EXTENSIVE EVAL, s/p EVAL @ [facility]; HEADACHE:  s/p EXTENSIVE EVAL; ON ELAVIL  . Sleep apnea 03/31/10    UNABLE TO TOLERATE CPAP; > HOME SLEEP TEST AHI 15.6  . Obesity   . Dupuytren contracture   . Nephrolithiasis   . CRI (chronic renal insufficiency)     BASELINE Cr. 1.7 ----DR. MATTINGLY  . Carotid artery occlusion     0-39% BILATERALLY  W/CHRONICALLY OCCLUDED L VERTEBRAL  . Angina   . ICD (implantable cardiac defibrillator) in place   . Pacemaker   . PE (pulmonary embolism) 2005  . Myocardial infarction     x3  . Diabetes mellitus without complication   . GERD (gastroesophageal reflux disease)   . Arthritis     Past Surgical History  Procedure Laterality Date  . Insert / replace / remove pacemaker  2006    s/p GUIDANT IMPLANTABLE CARDIVERTER DEFIBRILLATOR  . Cataract extraction    . Tonsillectomy      AS A CHILD  . Kidney stone surgery    . Coronary artery bypass graft  1995     AND 2003  . Eye surgery    . Pacemaker lead removal  08/28/2012    Procedure: PACEMAKER LEAD REMOVAL;  Surgeon: Gregg W Taylor, MD;  Location: [facility] OR;  Service: Cardiovascular;  Laterality: N/A;    Current Outpatient Prescriptions  Medication Sig Dispense Refill  . aspirin EC 81 MG tablet Take 1 tablet (81 mg total) by mouth daily.  30 tablet  3  . carvedilol (COREG) 25 MG tablet Take 1 tablet in the morning and 1/2 tablet in the evening      . CRESTOR 40 MG tablet TAKE ONE (1) TABLET EACH DAY  30 tablet  2  . fish oil-omega-3 fatty acids 1000 MG capsule Take 1 g by mouth daily.      .   furosemide (LASIX) 40 MG tablet Take 40 mg by mouth See admin instructions. Patient takes 1 tablet every day with permission to take additional tablet if needed.      . isosorbide mononitrate (IMDUR) 60 MG 24 hr tablet Take 1 tablet (60 mg total) by mouth daily.  30 tablet  6  . nateglinide (STARLIX) 60 MG tablet Take 60 mg by mouth 3 (three) times daily with meals. Take with the two largest meals of the day. Pt sometimes forgets to take      . nitroGLYCERIN (NITROSTAT) 0.4 MG SL tablet Place 1 tablet (0.4 mg total) under the tongue every 5 (five) minutes x 3 doses as needed for chest pain.  25 tablet  3  . omeprazole (PRILOSEC) 40 MG capsule TAKE ONE (1) CAPSULE EACH DAY  30 capsule  5  . ramipril (ALTACE) 5 MG capsule Take 1 capsule (5 mg total) by mouth  daily.  30 capsule  6  . warfarin (COUMADIN) 3 MG tablet Take as directed by the coumadin clinic  45 tablet  3  . zolpidem (AMBIEN CR) 12.5 MG CR tablet Take 1 tablet (12.5 mg total) by mouth at bedtime as needed for sleep.  30 tablet  3   No current facility-administered medications for this visit.    Allergies  Allergen Reactions  . Ezetimibe-Simvastatin     REACTION: myalgia: TOLERATES PRAVOCHOL OK    Review of Systems negative except from HPI and PMH  Physical Exam BP 128/72  Pulse 51  Ht 5' 9.5" (1.765 m)  Wt 228 lb (103.42 kg)  BMI 33.20 kg/m2 Well developed and well nourished in no acute distress HENT normal E scleral and icterus clear Neck Supple JVP flat; carotids brisk and full Clear to ausculation  Regular rate and rhythm, no murmurs gallops or rub Soft with active bowel sounds No clubbing cyanosis no Edema Alert and oriented, grossly normal motor and sensory function Skin Warm and Dry    Assessment and  Plan

## 2013-10-14 ENCOUNTER — Encounter (HOSPITAL_COMMUNITY): Payer: Self-pay | Admitting: Nurse Practitioner

## 2013-10-14 DIAGNOSIS — N183 Chronic kidney disease, stage 3 unspecified: Secondary | ICD-10-CM

## 2013-10-14 DIAGNOSIS — I25810 Atherosclerosis of coronary artery bypass graft(s) without angina pectoris: Secondary | ICD-10-CM

## 2013-10-14 DIAGNOSIS — I200 Unstable angina: Secondary | ICD-10-CM

## 2013-10-14 DIAGNOSIS — Z86711 Personal history of pulmonary embolism: Secondary | ICD-10-CM

## 2013-10-14 LAB — CBC
HCT: 34 % — ABNORMAL LOW (ref 39.0–52.0)
HCT: 34.3 % — ABNORMAL LOW (ref 39.0–52.0)
HEMOGLOBIN: 11.2 g/dL — AB (ref 13.0–17.0)
Hemoglobin: 11.2 g/dL — ABNORMAL LOW (ref 13.0–17.0)
MCH: 29.1 pg (ref 26.0–34.0)
MCH: 29 pg (ref 26.0–34.0)
MCHC: 32.9 g/dL (ref 30.0–36.0)
MCHC: 32.7 g/dL (ref 30.0–36.0)
MCV: 88.9 fL (ref 78.0–100.0)
MCV: 88.3 fL (ref 78.0–100.0)
PLATELETS: 122 10*3/uL — AB (ref 150–400)
Platelets: 129 10*3/uL — ABNORMAL LOW (ref 150–400)
RBC: 3.86 MIL/uL — ABNORMAL LOW (ref 4.22–5.81)
RBC: 3.85 MIL/uL — AB (ref 4.22–5.81)
RDW: 14.6 % (ref 11.5–15.5)
RDW: 14.6 % (ref 11.5–15.5)
WBC: 5.8 10*3/uL (ref 4.0–10.5)
WBC: 5.9 10*3/uL (ref 4.0–10.5)

## 2013-10-14 LAB — PROTIME-INR
INR: 1.52 — ABNORMAL HIGH (ref 0.00–1.49)
Prothrombin Time: 17.9 seconds — ABNORMAL HIGH (ref 11.6–15.2)

## 2013-10-14 LAB — GLUCOSE, CAPILLARY: Glucose-Capillary: 87 mg/dL (ref 70–99)

## 2013-10-14 LAB — BASIC METABOLIC PANEL
BUN: 35 mg/dL — AB (ref 6–23)
CO2: 20 mEq/L (ref 19–32)
Calcium: 8.5 mg/dL (ref 8.4–10.5)
Chloride: 112 mEq/L (ref 96–112)
Creatinine, Ser: 1.91 mg/dL — ABNORMAL HIGH (ref 0.50–1.35)
GFR calc Af Amer: 38 mL/min — ABNORMAL LOW (ref >90)
GFR, EST NON AFRICAN AMERICAN: 33 mL/min — AB (ref >90)
GLUCOSE: 85 mg/dL (ref 70–99)
Potassium: 4.6 mEq/L (ref 3.7–5.3)
Sodium: 143 mEq/L (ref 137–147)

## 2013-10-14 MED ORDER — PANTOPRAZOLE SODIUM 40 MG PO TBEC: 40.0000 mg | DELAYED_RELEASE_TABLET | Freq: Every day | ORAL | Status: DC

## 2013-10-14 MED ORDER — ENOXAPARIN SODIUM 100 MG/ML ~~LOC~~ SOLN: 100.0000 mg | Freq: Two times a day (BID) | SUBCUTANEOUS | Status: DC

## 2013-10-14 MED ORDER — CLOPIDOGREL BISULFATE 75 MG PO TABS: 75.0000 mg | ORAL_TABLET | Freq: Every day | ORAL | Status: DC

## 2013-10-14 NOTE — Progress Notes (Addendum)
Referral received this afternoon for education for DM management. Pt has been discharged.  Pt should get this education in cardiac rehab if he goes. Otherwise, pt can request his PCP or cardiologist for OP ed referral at [facility]. MD, please add DM to active in-hospital problem list. Thank you, Lenor Coffin, RN, CNS,Diabetes Coordinator  Noted referral for OP follow-up.  Will enter order for OP education per electronic order. OP [facility] will call patient at home to schedule first appt. Thank you, Lenor Coffin, RN, CNS, Diabetes Coordinator [phone])

## 2013-10-14 NOTE — Discharge Instructions (Signed)
**  PLEASE REMEMBER TO BRING ALL OF YOUR MEDICATIONS TO EACH OF YOUR FOLLOW-UP OFFICE VISITS.  **We have replaced Prilosec (Omeprazole) with Protonix due to possible interaction between Plavix and Prilosec.  NO HEAVY LIFTING OR SEXUAL ACTIVITY X 7 DAYS. NO DRIVING X 3-5 DAYS. NO SOAKING BATHS, HOT TUBS, POOLS, ETC., X 7 DAYS.  Radial Site Care Refer to this sheet in the next few weeks. These instructions provide you with information on caring for yourself after your procedure. Your caregiver may also give you more specific instructions. Your treatment has been planned according to current medical practices, but problems sometimes occur. Call your caregiver if you have any problems or questions after your procedure. HOME CARE INSTRUCTIONS  You may shower the day after the procedure.Remove the bandage (dressing) and gently wash the site with plain soap and water.Gently pat the site dry.   Do not apply powder or lotion to the site.   Do not submerge the affected site in water for 3 to 5 days.   Inspect the site at least twice daily.   Do not flex or bend the affected arm for 24 hours.   No lifting over 5 pounds (2.3 kg) for 5 days after your procedure.   What to expect:  Any bruising will usually fade within 1 to 2 weeks.   Blood that collects in the tissue (hematoma) may be painful to the touch. It should usually decrease in size and tenderness within 1 to 2 weeks.  SEEK IMMEDIATE MEDICAL CARE IF:  You have unusual pain at the radial site.   You have redness, warmth, swelling, or pain at the radial site.   You have drainage (other than a small amount of blood on the dressing).   You have chills.   You have a fever or persistent symptoms for more than 72 hours.   You have a fever and your symptoms suddenly get worse.   Your arm becomes pale, cool, tingly, or numb.   You have heavy bleeding from the site. Hold pressure on the site.

## 2013-10-14 NOTE — Discharge Summary (Signed)
Discharge Summary   Patient ID: Joseph Hamilton,  MRN: [id number], DOB/AGE: 74-Jan-1942 74 y.o.  Admit date: 10/13/2013 Discharge date: 10/14/2013  Primary Care Provider: OSEI-BONSU,GEORGE Primary Cardiologist: D. Bensimhon, MD / S. Graciela Husbands, MD   Discharge Diagnoses Principal Problem:   Unstable angina Active Problems:   CORONARY ATHEROSCLEROSIS NATIVE CORONARY ARTERY   DIABETES MELLITUS, TYPE II   SYSTOLIC HEART FAILURE, CHRONIC   Ischemic cardiomyopathy   CKD (chronic kidney disease), stage III   History of pulmonary embolus (PE)   HYPERLIPIDEMIA-MIXED   OSA (obstructive sleep apnea)   GERD   Long term (current) use of anticoagulants  Allergies Allergies  Allergen Reactions  . Ezetimibe-Simvastatin     REACTION: myalgia: TOLERATES PRAVOCHOL OK   Procedures  Cardiac Catheterization and Percutaneous Coronary Intervention 1.19.2015  PROCEDURAL FINDINGS Hemodynamics: AO 119/71 with a mean of 91 LV 122/19              Coronary angiography: Coronary dominance: right  Left mainstem: The left main is heavily calcified. It divides into the LAD and left circumflex. There is diffuse 50% stenosis. Left anterior descending (LAD): The LAD is totally occluded at the first septal perforator. Left circumflex (LCx): The circumflex is extensively diseased with heavy calcification. The vessel is totally occluded in its proximal aspect. There is an intermediate branch with continued patency and mild diffuse nonobstructive stenosis of 30-40%. Right coronary artery (RCA): The RCA is severely calcified. There is 90% ostial stenosis. The mid vessel is totally occluded at the first RV marginal branch.  Saphenous vein graft obtuse marginal: Total occlusion at the aortic anastomosis unchanged from the previous study Saphenous vein graft to diagonal: Not selectively injected, known to be totally occluded Saphenous vein graft to distal RCA: There is an eccentric 80% stenosis in the proximal body the  graft. The distal insertion site is patent. The graft is moderately calcified throughout.   **The VG->Distal RCA was successfully stented using a 4.0x23 mm Xience Alpine drug-eluting stent.**  LIMA to LAD: The LIMA graft is tortuous, but widely patent without significant stenosis. The LAD after the LIMA insertion site is patent. Left ventriculography: Deferred because of chronic kidney disease  Final Conclusions:    1. Severe native three-vessel coronary artery disease 2. Status post 2 previous CABG surgeries with continued patency of the LIMA to LAD and severe stenosis in the saphenous vein graft to distal RCA treated successfully with PCI using a single drug-eluting stent. All other grafts occluded.   Recommendations:  Triple anticoagulant Rx for 3-6 months with warfarin, plavix, and ASA 81 mg, followed by continued treatment with low-dose ASA and warfarin. Will try to arrange for lovenox at discharge since the patient has had recurrent pulmonary emboli when off anticoagulation in the past.   _____________   History of Present Illness  73 year old male with prior history of coronary artery disease status post coronary artery bypass grafting with redo grafting in 2003. He also has a history of an ischemic cardiomyopathy status post prior AICD placement and subsequent removal in 2013, in the setting of slightly improved LV function. He was recently seen in clinic with complaints of upper back pain similar to his prior angina. Decision was made to pursue diagnostic catheterization.   Hospital Course  Patient presented to the [facility] on 10/13/2013 and underwent diagnostic cardiac catheterization revealing severe native multivessel coronary artery disease with known occlusions of the vein graft to the obtuse marginal and the vein graft to the diagonal. The  LIMA to the LAD was patent. The vein graft to the distal RCA had a new 80% stenosis. This was felt to be  the culprit for his symptoms and was successfully treated using a 4.0 x 23 mm Xience Alpine drug-eluting stent. Patient tolerated procedure well and post procedure, following adequate time for hemostasis after sheath removal, Lovenox and Coumadin therapy were resumed. He has been ambulating without difficulty, recurrent symptoms, or limitations. He will be discharged home today in good condition. Given history of recurrent pulmonary embolus when off of anticoagulation, we have opted to bridge him with Lovenox until his INR is therapeutic. We'll plan to keep him on triple anticoagulant therapy for a period of 3-6 months with continuation of aspirin and warfarin beyond that timeframe.   We have arranged for Coumadin clinic followup on Friday, January 23.  Discharge Vitals Blood pressure 155/83, pulse 65, temperature 97.4 F (36.3 C), temperature source Oral, resp. rate 18, height 5' 9.5" (1.765 m), weight 220 lb 14.4 oz (100.2 kg), SpO2 99.00%.  Filed Weights   10/13/13 1026 10/14/13 0543  Weight: 228 lb (103.42 kg) 220 lb 14.4 oz (100.2 kg)    Labs  CBC  Recent Labs  10/14/13 0058 10/14/13 0445  WBC 5.8 5.9  HGB 11.2* 11.2*  HCT 34.0* 34.3*  MCV 88.3 88.9  PLT 122* 129*   Basic Metabolic Panel  Recent Labs  10/13/13 1057 10/14/13 0445  NA 143 143  K 5.2 4.6  CL 108 112  CO2 22 20  GLUCOSE 132* 85  BUN 41* 35*  CREATININE 2.29* 1.91*  CALCIUM 9.3 8.5   Disposition  Pt is being discharged home today in good condition.  Follow-up Plans & Appointments      Follow-up Information   Follow up with OSEI-BONSU,GEORGE, MD. (as scheduled.)    Specialty:  Internal Medicine   Contact information:   [address] [place] [address] [phone]       Follow up with Aundria Rud, NP On 10/21/2013. (1:40 PM - Dr. Prescott Gum Nurse Practitioner)    Specialty:  Nurse Practitioner   Contact information:   [address] [place]  [address] [phone]       Follow up with [facility] On 10/17/2013. (8:15 AM)    Contact information:   [address] [place] [address]      Discharge Medications    Medication List    STOP taking these medications       omeprazole 40 MG capsule  Commonly known as:  PRILOSEC      TAKE these medications       aspirin EC 81 MG tablet  Take 1 tablet (81 mg total) by mouth daily.     carvedilol 25 MG tablet  Commonly known as:  COREG  Take 12.5-25 mg by mouth 2 (two) times daily with a meal. Take 1 tablet in the morning and 1/2 tablet in the evening     clopidogrel 75 MG tablet  Commonly known as:  PLAVIX  Take 1 tablet (75 mg total) by mouth daily with breakfast.     enoxaparin 100 MG/ML injection  Commonly known as:  LOVENOX  Inject 1 mL (100 mg total) into the skin every 12 (twelve) hours.     fish oil-omega-3 fatty acids 1000 MG capsule  Take 1 g by mouth daily.     furosemide 40 MG tablet  Commonly known as:  LASIX  Take  40 mg by mouth See admin instructions. Patient takes 1 tablet every day with permission to take additional tablet if needed.     isosorbide mononitrate 60 MG 24 hr tablet  Commonly known as:  IMDUR  Take 1 tablet (60 mg total) by mouth daily.     nateglinide 60 MG tablet  Commonly known as:  STARLIX  Take 60 mg by mouth 3 (three) times daily with meals. Take with the two largest meals of the day. Pt sometimes forgets to take     nitroGLYCERIN 0.4 MG SL tablet  Commonly known as:  NITROSTAT  Place 1 tablet (0.4 mg total) under the tongue every 5 (five) minutes x 3 doses as needed for chest pain.     pantoprazole 40 MG tablet  Commonly known as:  PROTONIX  Take 1 tablet (40 mg total) by mouth daily.     ramipril 5 MG capsule  Commonly known as:  ALTACE  Take 1 capsule (5 mg total) by mouth daily.     rosuvastatin 40 MG tablet  Commonly known as:  CRESTOR  Take 40 mg by mouth daily.     warfarin 3 MG  tablet  Commonly known as:  COUMADIN  Take 3-4.5 mg by mouth daily. Takes 4.5 mg on Tuesday and Saturday. 3 mg on all other days.     zolpidem 12.5 MG CR tablet  Commonly known as:  AMBIEN CR  Take 12.5 mg by mouth at bedtime.       Outstanding Labs/Studies  F/U INR on 10/17/2013.  Duration of Discharge Encounter   Greater than 30 minutes including physician time.  Signed, Nicolasa Duckinghristopher Theoren Palka NP 10/14/2013, 11:28 AM

## 2013-10-14 NOTE — Progress Notes (Signed)
TR BAND REMOVAL  LOCATION:    right radial  DEFLATED PER PROTOCOL:    yes  TIME BAND OFF / DRESSING APPLIED:    2200  SITE UPON ARRIVAL:    Level 0  SITE AFTER BAND REMOVAL:    Level 0  REVERSE ALLEN'S TEST:     Positive per pleth rt thumb  CIRCULATION SENSATION AND MOVEMENT:    Within Normal Limits   yes  COMMENTS:  Gabe RN attempted to deflate but had to reinstill air due to bleeding.  2nd attempt to deflate successful.  Post radial cath instruction sheet given and reviewed w/ pt.  Questions answered.  Tylenol and elevation effective for right wrist discomfort.

## 2013-10-14 NOTE — Progress Notes (Addendum)
    Subjective:  Feels good this am. No complaints. No chest pain or dyspnea.  Objective:  Vital Signs in the last 24 hours: Temp:  [97.4 F (36.3 C)-98.3 F (36.8 C)] 97.4 F (36.3 C) (01/20 0734) Pulse Rate:  [60-100] 65 (01/20 0734) Resp:  [16-18] 18 (01/20 0734) BP: (104-155)/(65-84) 155/83 mmHg (01/20 0734) SpO2:  [94 %-99 %] 99 % (01/20 0734) Weight:  [220 lb 14.4 oz (100.2 kg)-228 lb (103.42 kg)] 220 lb 14.4 oz (100.2 kg) (01/20 0543)  Intake/Output from previous day: 01/19 0701 - 01/20 0700 In: 774.2 [P.O.:240; I.V.:534.2] Out: 450 [Urine:450]  Physical Exam: Pt is alert and oriented, NAD HEENT: normal Neck: JVP - normal Lungs: CTA bilaterally CV: RRR without murmur or gallop Abd: soft, NT, Positive BS, no hepatomegaly Ext: no C/C/E, distal pulses intact and equal, left radial site clear Skin: warm/dry no rash  Lab Results:  Recent Labs  10/14/13 0058 10/14/13 0445  WBC 5.8 5.9  HGB 11.2* 11.2*  PLT 122* 129*    Recent Labs  10/13/13 1057 10/14/13 0445  NA 143 143  K 5.2 4.6  CL 108 112  CO2 22 20  GLUCOSE 132* 85  BUN 41* 35*  CREATININE 2.29* 1.91*   No results found for this basename: TROPONINI, CK, MB,  in the last 72 hours  Assessment/Plan:  CAD s/p CABG with CCS Class 3 angina: now s/p PCI of the SVG-RCA with a single drug-eluting stent. Procedure was uncomplicated, creatinine down to 1.9 with hydration. Pt stable for discharge.   Recommend:  Back on warfarin (recurrent PE) should follow-up in Coumadin Clinic Friday  Lovenox 100 mg subcutaneous BID (arranged for home through his pharmacy)  ASA 81 mg, plavix 75 mg, and warfarin x 6 months, then STOP plavix at 6 months  Resume ramipril at home dose tomorrow  Follow-up Dr Gala Romney or APP in 2 weeks  Pt educated about signs/symptoms of bleeding and importance of immediate medical attention if this occurs    Tonny Bollman, M.D. 10/14/2013, 7:53 AM

## 2013-10-14 NOTE — Progress Notes (Signed)
CARDIAC REHAB PHASE I   PRE:  Rate/Rhythm: 57 SB    BP: sitting 161/76    SaO2:   MODE:  Ambulation: 650 ft   POST:  Rate/Rhythm: 96 SR    BP: sitting 169/71     SaO2:   Pt with some SOB walking, sts he hasn't had his Imdur and this is typical if he hasn't had it. BP elevated. Ed completed, pt sts diet change is difficult for him. Encouraged DM classes, to discuss with his PCP. Not interested in [facility] again but is a member of their Maintenance program. Will resume this. Encouraged daily ex. [phone]   Elissa Lovett [place] CES, ACSM 10/14/2013 9:02 AM

## 2013-10-17 ENCOUNTER — Ambulatory Visit (INDEPENDENT_AMBULATORY_CARE_PROVIDER_SITE_OTHER): Payer: Medicare Other | Admitting: *Deleted

## 2013-10-17 DIAGNOSIS — I82409 Acute embolism and thrombosis of unspecified deep veins of unspecified lower extremity: Secondary | ICD-10-CM

## 2013-10-17 DIAGNOSIS — I82A11 Acute embolism and thrombosis of right axillary vein: Secondary | ICD-10-CM

## 2013-10-17 DIAGNOSIS — I82A19 Acute embolism and thrombosis of unspecified axillary vein: Secondary | ICD-10-CM

## 2013-10-17 DIAGNOSIS — Z7901 Long term (current) use of anticoagulants: Secondary | ICD-10-CM

## 2013-10-17 DIAGNOSIS — Z5181 Encounter for therapeutic drug level monitoring: Secondary | ICD-10-CM

## 2013-10-17 DIAGNOSIS — I2699 Other pulmonary embolism without acute cor pulmonale: Secondary | ICD-10-CM

## 2013-10-17 LAB — POCT INR: INR: 2

## 2013-10-21 ENCOUNTER — Encounter (HOSPITAL_COMMUNITY): Payer: Self-pay

## 2013-10-21 ENCOUNTER — Ambulatory Visit (HOSPITAL_COMMUNITY)
Admit: 2013-10-21 | Discharge: 2013-10-21 | Disposition: A | Discharge status: Home / Self Care | Payer: Medicare Other | Attending: Internal Medicine | Admitting: Internal Medicine

## 2013-10-21 VITALS — BP 121/72 | HR 54 | Resp 16 | Wt 219.5 lb

## 2013-10-21 DIAGNOSIS — I5022 Chronic systolic (congestive) heart failure: Secondary | ICD-10-CM

## 2013-10-21 DIAGNOSIS — I252 Old myocardial infarction: Secondary | ICD-10-CM | POA: Insufficient documentation

## 2013-10-21 DIAGNOSIS — Z951 Presence of aortocoronary bypass graft: Secondary | ICD-10-CM | POA: Insufficient documentation

## 2013-10-21 DIAGNOSIS — E785 Hyperlipidemia, unspecified: Secondary | ICD-10-CM

## 2013-10-21 DIAGNOSIS — I509 Heart failure, unspecified: Secondary | ICD-10-CM | POA: Insufficient documentation

## 2013-10-21 DIAGNOSIS — G4730 Sleep apnea, unspecified: Secondary | ICD-10-CM | POA: Insufficient documentation

## 2013-10-21 DIAGNOSIS — N183 Chronic kidney disease, stage 3 unspecified: Secondary | ICD-10-CM | POA: Insufficient documentation

## 2013-10-21 DIAGNOSIS — I25810 Atherosclerosis of coronary artery bypass graft(s) without angina pectoris: Secondary | ICD-10-CM

## 2013-10-21 DIAGNOSIS — I129 Hypertensive chronic kidney disease with stage 1 through stage 4 chronic kidney disease, or unspecified chronic kidney disease: Secondary | ICD-10-CM | POA: Insufficient documentation

## 2013-10-21 DIAGNOSIS — E119 Type 2 diabetes mellitus without complications: Secondary | ICD-10-CM | POA: Insufficient documentation

## 2013-10-21 DIAGNOSIS — G4700 Insomnia, unspecified: Secondary | ICD-10-CM | POA: Insufficient documentation

## 2013-10-21 DIAGNOSIS — Z86718 Personal history of other venous thrombosis and embolism: Secondary | ICD-10-CM | POA: Insufficient documentation

## 2013-10-21 DIAGNOSIS — I2699 Other pulmonary embolism without acute cor pulmonale: Secondary | ICD-10-CM

## 2013-10-21 DIAGNOSIS — I2510 Atherosclerotic heart disease of native coronary artery without angina pectoris: Secondary | ICD-10-CM | POA: Insufficient documentation

## 2013-10-21 DIAGNOSIS — I2589 Other forms of chronic ischemic heart disease: Secondary | ICD-10-CM | POA: Insufficient documentation

## 2013-10-21 DIAGNOSIS — Z86711 Personal history of pulmonary embolism: Secondary | ICD-10-CM | POA: Insufficient documentation

## 2013-10-21 MED ORDER — SPIRONOLACTONE 25 MG PO TABS: 12.5000 mg | ORAL_TABLET | Freq: Every day | ORAL | Status: DC

## 2013-10-21 NOTE — Patient Instructions (Addendum)
Start taking spironolactone 12.5 mg (1/2 tablet) daily.  Follow up in 3 months with ECHO.  Go to [facility] for labs next week.  Do the following things EVERYDAY: 1) Weigh yourself in the morning before breakfast. Write it down and keep it in a log. 2) Take your medicines as prescribed 3) Eat low salt foods-Limit salt (sodium) to 2000 mg per day.  4) Stay as active as you can everyday 5) Limit all fluids for the day to less than 2 liters

## 2013-10-22 NOTE — Progress Notes (Signed)
Patient ID: Joseph Hamilton, male   DOB: 11/04/1940, 73 y.o.   MRN: [id number] Hematologist: Dr Joseph Hamilton Pulmonary: Dr Joseph Hamilton  HPI:  Joseph Hamilton is a 73 year old male with a history of coronary artery disease, status post previous myocardial infarction, as well as redo coronary bypass grafting in 2003.  He also has chronic systolic HF due to ICM with EF 35% (02/18/13).   The remainder of his medical history is notable for a history of pulmonary emboli in 2005 (which recurred after stopping anti-coagulation), CRI baseline 2.0-2.5, hypertension, hyperlipidemia, OSA, and chronic dizziness/headaches, which have been evaluated extensively.ICD reached EOL in 12/13 and was extracted and not re-implanted.   [facility] with [facility] 02/18/13 after coumadin stopped (remote PE > 9 years ago). D-Dimer 11.96. Lower extremity dopplers + DVT RLE popliteal vein. ECHO  EF 35% no evidence of RV strain. Discharged on Lovenox/coumadin until INR > 2.  Since last appointment in clinic, had echo in 10/14 that was reviewed by Dr. Gala Hamilton and thought to show EF around 25-30%.  Cardiac MRI in 12/14 showed EF 30% with inferior and lateral hypokinesis.  Prior to replacing ICD, patient had LHC in 1/15 showing patent LIMA-LAD, severe stenosis SVG-RCA, other SVGs totally occluded.  He had DES to SVG-RCA.    Since intervention, central scapular pain (his prior anginal equivalent) seems to have resolved.  He can walk on a treadmill without dyspnea. He has some dyspnea walking up steps.  He is not using CPAP.  He props himself up at night but this seems more to do with OSA than orthopnea.  No PND.  He occasionally takes an extra 40 mg po Lasix when weight rises.   Labs 04/28/13: K 4.9, creatinine 1.86 Labs 9/14: LDL 46, HDL 30 Labs 1/15: K 4.6, creatinine 1.9  ROS: All systems negative except as listed in HPI, PMH and Problem List.  Past Medical History  Diagnosis Date  . Coronary artery disease     a. s/p MI 1995 Y 2002;  b.  s/p remote CABG;  c. 2003: s/p redo CABG x 3;  d. 09/2013 Cath: LM 50, LAD 100, LCX 100, RI 30-40, RCA 90ost/12414m, Vg->OM 100, VG->Diag 100, VG->dRCA 80p (4.0x23 Xience DES), LIMA->LAD ok.  . Chronic systolic CHF (congestive heart failure)     a. 06/2013 Echo: EF 40%.  . Hypertension   . Hyperlipidemia   . Dizziness     CHRONIC; s/p EXTENSIVE EVAL, s/p EVAL @ [facility]; HEADACHE:  s/p EXTENSIVE EVAL; ON ELAVIL  . Obesity   . Ischemic cardiomyopathy     a. 06/2013 Echo: EF 40%, mod LVH, DD, mild AI, mild biatrial enlargement.  . Carotid artery occlusion     0-39% BILATERALLY W/CHRONICALLY OCCLUDED L VERTEBRAL  . PE (pulmonary embolism) 2005; 2013    a. chronic coumadin.  Marland Kitchen. GERD (gastroesophageal reflux disease)   . Arthritis   . Complication of anesthesia     "when they took ICD out 09/01/2012, I had a great deal of trouble waking up"  . Automatic implantable cardioverter-defibrillator in situ     a. s/p removal in setting of improved LV fxn-  08/2012.  Marland Kitchen. Heart murmur     "as a kid"  . DVT (deep venous thrombosis) 2013    RLE  . Type II diabetes mellitus   . Sleep apnea 03/31/10    UNABLE TO TOLERATE CPAP; > HOME SLEEP TEST AHI 15.6  . Nephrolithiasis   . CKD (chronic kidney  disease), stage III     BASELINE Cr. 1.7 ----DR. MATTINGLY  . Dupuytren contracture     Current Outpatient Prescriptions  Medication Sig Dispense Refill  . aspirin EC 81 MG tablet Take 1 tablet (81 mg total) by mouth daily.  30 tablet  3  . carvedilol (COREG) 25 MG tablet Take 12.5-25 mg by mouth 2 (two) times daily with a meal. Take 1 tablet in the morning and 1/2 tablet in the evening      . clopidogrel (PLAVIX) 75 MG tablet Take 1 tablet (75 mg total) by mouth daily with breakfast.  30 tablet  5  . fish oil-omega-3 fatty acids 1000 MG capsule Take 1 g by mouth daily.      . furosemide (LASIX) 40 MG tablet Take 40 mg by mouth See admin instructions. Patient takes 1 tablet every day with permission to take  additional tablet if needed.      . isosorbide mononitrate (IMDUR) 60 MG 24 hr tablet Take 1 tablet (60 mg total) by mouth daily.  30 tablet  6  . nateglinide (STARLIX) 60 MG tablet Take 60 mg by mouth 3 (three) times daily with meals. Take with the two largest meals of the day. Pt sometimes forgets to take      . nitroGLYCERIN (NITROSTAT) 0.4 MG SL tablet Place 1 tablet (0.4 mg total) under the tongue every 5 (five) minutes x 3 doses as needed for chest pain.  25 tablet  3  . pantoprazole (PROTONIX) 40 MG tablet Take 1 tablet (40 mg total) by mouth daily.  30 tablet  6  . ramipril (ALTACE) 5 MG capsule Take 1 capsule (5 mg total) by mouth daily.  30 capsule  6  . rosuvastatin (CRESTOR) 40 MG tablet Take 40 mg by mouth daily.      Marland Kitchen warfarin (COUMADIN) 3 MG tablet Take 3-4.5 mg by mouth daily. Takes 4.5 mg on Tuesday and Saturday. 3 mg on all other days.      Marland Kitchen zolpidem (AMBIEN CR) 12.5 MG CR tablet Take 12.5 mg by mouth at bedtime.      Marland Kitchen spironolactone (ALDACTONE) 25 MG tablet Take 0.5 tablets (12.5 mg total) by mouth daily.  15 tablet  3   No current facility-administered medications for this encounter.     Filed Vitals:   10/21/13 1354  BP: 121/72  Pulse: 54  Resp: 16  Weight: 219 lb 8 oz (99.565 kg)  SpO2: 98%    PHYSICAL EXAM: General:  No acute distress HEENT: normal Neck: supple. JVP 5-6  Carotids 2+ bilaterally; no bruits. No lymphadenopathy or thryomegaly appreciated. Cor: PMI normal. Regular. No rubs, gallops or murmurs. Lungs: clear. Decreased at bases.  Abdomen: soft, nontender, distended. No hepatosplenomegaly. No bruits or masses. Good bowel sounds. Extremities: no cyanosis, clubbing, rash.  1+ ankle edema.  Neuro: alert & orientedx3, cranial nerves grossly intact. Moves all 4 extremities w/o difficulty. Affect pleasant. Skin: Chest keloid present central aspect.   ASSESSMENT & PLAN:  1) Chronic systolic HF: Ischemic cardiomyopathy, EF 30% on 12/14 cMRI.  In 12/13,  his ICD reached EOL and the ICD was explanted and leads were extracted.  NYHA II symptoms and volume status stable.  He had recent PCI to SVG-RCA with hope to improve LV function and avoid replacing ICD.  - He is taking Coreg 25 qam, 12.5 qpm.  HR 54 so will not adjust today.  - Continue altace 5 mg daily.  Will not adjust given CKD.  -  Will try spironolactone 12.5 mg daily (last K 4.6).  Will need to follow K closely, will get BMET in 1 week.  If K rises significantly, will have to stop.  - Continue Lasix 40 mg daily, may use additional 40 mg with rising weight (as he has been doing).  - Repeat echo in 5/15 (>3 mos post-PCI).  If EF remains <35%, will need ICD.  - Reinforced the need and importance of daily weights, a low sodium diet, and fluid restriction (less than 2 L a day). Instructed to call the HF clinic if weight increases more than 3 lbs overnight or 5 lbs in a week.  2) CAD: Scapular pain (anginal equivalent) has resolved post-PCI to SVG-RCA.  Continue ASA 81 + Plavix + warfarin x 6 months.  After 6 months, continue ASA 81 + warfarin.  Continue statin.  3) HLD: Check lipids with labs in 1 week. 4) ?OSA: Does not think he can tolerate CPAP.  5) Insomnia: Ambien not working well.  Would followup with PCP.   6) PE, recurrent: Needs lifelong anti-coagulation 7) CKD: Stable.  Repeat BMET 1 week after spironolactone added.   Followup in 5/15.   Marca Ancona MD  8:23 AM      -

## 2013-10-31 ENCOUNTER — Ambulatory Visit (INDEPENDENT_AMBULATORY_CARE_PROVIDER_SITE_OTHER): Payer: Medicare Other

## 2013-10-31 DIAGNOSIS — I82A19 Acute embolism and thrombosis of unspecified axillary vein: Secondary | ICD-10-CM

## 2013-10-31 DIAGNOSIS — Z5181 Encounter for therapeutic drug level monitoring: Secondary | ICD-10-CM

## 2013-10-31 DIAGNOSIS — I82409 Acute embolism and thrombosis of unspecified deep veins of unspecified lower extremity: Secondary | ICD-10-CM

## 2013-10-31 DIAGNOSIS — I2699 Other pulmonary embolism without acute cor pulmonale: Secondary | ICD-10-CM

## 2013-10-31 DIAGNOSIS — I82A11 Acute embolism and thrombosis of right axillary vein: Secondary | ICD-10-CM

## 2013-10-31 LAB — POCT INR: INR: 3.1

## 2013-11-05 ENCOUNTER — Telehealth (HOSPITAL_COMMUNITY): Payer: Self-pay | Admitting: Cardiology

## 2013-11-05 NOTE — Telephone Encounter (Signed)
WAS TOLD BY DR. MATTINGLEY TO D/C A MED THAT DR Gala Romney PRESCRIBED  PLEASE ADVISE

## 2013-11-07 ENCOUNTER — Telehealth: Payer: Self-pay | Admitting: Pulmonary Disease

## 2013-11-07 DIAGNOSIS — G4733 Obstructive sleep apnea (adult) (pediatric): Secondary | ICD-10-CM

## 2013-11-07 NOTE — Addendum Note (Signed)
Addended by: Noralee Space on: 11/07/2013 02:46 PM   Modules accepted: Orders, Medications

## 2013-11-07 NOTE — Telephone Encounter (Signed)
Pt is aware of download results. Order has been placed for pressure change.

## 2013-11-07 NOTE — Telephone Encounter (Signed)
Called and spoke with pt and he stated that [facility] told him it was time for his 60 day download.   [facility] will send this over to [facility].   Pt stated that he needs advise on the cpap machine.  He stated that  The machine is not working well for him.  He stated that the cpap pressure was reduced 30 days ago and he stated that since then the mask comes off at night while he is sleeping, and he is not able to get back to sleep and he is unable to get the mask to fit back properly and ends up taking the mask off and not wearing the cpap.  He has not been using the cpap for a while.  Pt is getting frustrated and not sure if the pressure needs to be lowered.    FYI---Pt had a cath done and stent placed about 3 weeks ago.   [facility] please advise. Thanks  Allergies  Allergen Reactions  . Ezetimibe-Simvastatin     REACTION: myalgia: TOLERATES PRAVOCHOL OK

## 2013-11-07 NOTE — Telephone Encounter (Signed)
CPAP 10/08/13 to 11/06/13 >> Used on 4 of 30 nights with average 4 hrs 44 min.  Average AHI 18.3 with CPAP 10 cm H2O.  Will have my nurse inform pt that sleep apnea number is still high.  He is probably taking his mask off because his pressure has been too low.  Will increase CPAP to 12 cm H2O, and repeat download 2 weeks after pressure change.

## 2013-11-07 NOTE — Telephone Encounter (Signed)
Received notes and labs from Dr Merrily Brittle, he stopped Cleda Daub to due K 5.4 on labs ok per Dr Gala Romney to remain off, cont to monitor BP pt aware and agreeable, he is sch for recheck labs in 2 weeks with Dr Merrily Brittle

## 2013-11-12 ENCOUNTER — Encounter: Payer: Medicare Other | Attending: Cardiovascular Disease

## 2013-11-12 ENCOUNTER — Ambulatory Visit (INDEPENDENT_AMBULATORY_CARE_PROVIDER_SITE_OTHER): Payer: Medicare Other

## 2013-11-12 VITALS — Ht 69.0 in | Wt 218.1 lb

## 2013-11-12 DIAGNOSIS — I82A19 Acute embolism and thrombosis of unspecified axillary vein: Secondary | ICD-10-CM

## 2013-11-12 DIAGNOSIS — I2699 Other pulmonary embolism without acute cor pulmonale: Secondary | ICD-10-CM

## 2013-11-12 DIAGNOSIS — Z5181 Encounter for therapeutic drug level monitoring: Secondary | ICD-10-CM

## 2013-11-12 DIAGNOSIS — I82A11 Acute embolism and thrombosis of right axillary vein: Secondary | ICD-10-CM

## 2013-11-12 DIAGNOSIS — E119 Type 2 diabetes mellitus without complications: Secondary | ICD-10-CM

## 2013-11-12 DIAGNOSIS — I82409 Acute embolism and thrombosis of unspecified deep veins of unspecified lower extremity: Secondary | ICD-10-CM

## 2013-11-12 LAB — POCT INR: INR: 2.6

## 2013-11-12 NOTE — Progress Notes (Signed)
Patient was seen on 11/12/2013 for the first of a series of three diabetes self-management courses at the [facility].  Current HbA1c: 6.6% on 09/17/13  The following learning objectives were met by the patient during this class:  Describe diabetes  State some common risk factors for diabetes  Defines the role of glucose and insulin  Identifies type of diabetes and pathophysiology  Describe the relationship between diabetes and cardiovascular risk  State the members of the Healthcare Team  States the rationale for glucose monitoring  State when to test glucose  State their individual Target Range  State the importance of logging glucose readings  Describe how to interpret glucose readings  Identifies A1C target  Explain the correlation between A1c and eAG values  State symptoms and treatment of high blood glucose  State symptoms and treatment of low blood glucose  Explain proper technique for glucose testing  Identifies proper sharps disposal  Handouts given during class include:  Living Well with Diabetes book  Carb Counting and Meal Planning book  Meal Plan Card  Carbohydrate guide  Meal planning worksheet  Low Sodium Flavoring Tips  The diabetes portion plate  Z6X to eAG Conversion Chart  Diabetes Medications  Diabetes Recommended Care Schedule  Support Group  Diabetes Success Plan  Core Class Satisfaction Survey  Follow-Up Plan:  Attend core 2

## 2013-11-19 DIAGNOSIS — E119 Type 2 diabetes mellitus without complications: Secondary | ICD-10-CM

## 2013-11-19 NOTE — Progress Notes (Signed)

## 2013-11-26 ENCOUNTER — Encounter: Payer: Medicare Other | Attending: Cardiovascular Disease

## 2013-11-26 DIAGNOSIS — E119 Type 2 diabetes mellitus without complications: Secondary | ICD-10-CM

## 2013-11-26 NOTE — Progress Notes (Signed)
Patient was seen on 11/26/13 for the third of a series of three diabetes self-management courses at the [facility]. The following learning objectives were met by the patient during this class:    State the amount of activity recommended for healthy living   Describe activities suitable for individual needs   Identify ways to regularly incorporate activity into daily life   Identify barriers to activity and ways to over come these barriers  Identify diabetes medications being personally used and their primary action for lowering glucose and possible side effects   Describe role of stress on blood glucose and develop strategies to address psychosocial issues   Identify diabetes complications and ways to prevent them  Explain how to manage diabetes during illness   Evaluate success in meeting personal goal   Establish 2-3 goals that they will plan to diligently work on until they return for the  66-month follow-up visit  Goals:  Follow Diabetes Meal Plan as instructed  Aim for 15-30 mins of physical activity daily as tolerated  Bring food record and glucose log to your follow up visit  Your patient has established the following 4 month goals in their individualized success plan: I will count my carb choices at most meals and snacks I will increase my activity level at least 3 days a week  Your patient has identified these potential barriers to change:  My motivation  Your patient has identified their diabetes self-care support plan as  [facility] awareness

## 2013-12-03 ENCOUNTER — Ambulatory Visit (INDEPENDENT_AMBULATORY_CARE_PROVIDER_SITE_OTHER): Payer: Medicare Other | Admitting: *Deleted

## 2013-12-03 DIAGNOSIS — I82A19 Acute embolism and thrombosis of unspecified axillary vein: Secondary | ICD-10-CM

## 2013-12-03 DIAGNOSIS — Z5181 Encounter for therapeutic drug level monitoring: Secondary | ICD-10-CM

## 2013-12-03 DIAGNOSIS — I82409 Acute embolism and thrombosis of unspecified deep veins of unspecified lower extremity: Secondary | ICD-10-CM

## 2013-12-03 DIAGNOSIS — I2699 Other pulmonary embolism without acute cor pulmonale: Secondary | ICD-10-CM

## 2013-12-03 DIAGNOSIS — I82A11 Acute embolism and thrombosis of right axillary vein: Secondary | ICD-10-CM

## 2013-12-03 LAB — POCT INR: INR: 2.3

## 2013-12-14 ENCOUNTER — Telehealth: Payer: Self-pay | Admitting: Pulmonary Disease

## 2013-12-14 NOTE — Telephone Encounter (Signed)
CPAP 11/08/13 to 12/07/13 >> used on 20 of 30 nights with average 5 hrs 37 min.  Average AHI 9.2 with CPAP 12 cm H2O.  Will have my nurse inform pt that CPAP reports looks better.  Can continue CPAP 12 cm H2O if he is doing well, but could increase to 13 cm H2O if he is still having difficulty with CPAP.

## 2013-12-18 ENCOUNTER — Telehealth (HOSPITAL_COMMUNITY): Payer: Self-pay | Admitting: Cardiology

## 2013-12-18 NOTE — Telephone Encounter (Signed)
Pt called to request refill of Ambien Per Ulla Potash, NP rx was given only until pt has established with PCP No refills for Ambien will be given from the CHF team  Per pt he has established with PCP and will discuss refills with them

## 2013-12-22 NOTE — Telephone Encounter (Signed)
Pt is aware of CPAP results.

## 2013-12-31 ENCOUNTER — Ambulatory Visit (INDEPENDENT_AMBULATORY_CARE_PROVIDER_SITE_OTHER): Payer: Medicare Other | Admitting: Pharmacist

## 2013-12-31 DIAGNOSIS — I82A19 Acute embolism and thrombosis of unspecified axillary vein: Secondary | ICD-10-CM

## 2013-12-31 DIAGNOSIS — Z5181 Encounter for therapeutic drug level monitoring: Secondary | ICD-10-CM

## 2013-12-31 DIAGNOSIS — I2699 Other pulmonary embolism without acute cor pulmonale: Secondary | ICD-10-CM

## 2013-12-31 DIAGNOSIS — I82A11 Acute embolism and thrombosis of right axillary vein: Secondary | ICD-10-CM

## 2013-12-31 DIAGNOSIS — I82409 Acute embolism and thrombosis of unspecified deep veins of unspecified lower extremity: Secondary | ICD-10-CM

## 2013-12-31 LAB — POCT INR: INR: 2.5

## 2014-01-07 ENCOUNTER — Encounter (HOSPITAL_COMMUNITY): Payer: Self-pay

## 2014-01-07 ENCOUNTER — Ambulatory Visit (HOSPITAL_COMMUNITY)
Admission: RE | Admit: 2014-01-07 | Discharge: 2014-01-07 | Disposition: A | Discharge status: Home / Self Care | Payer: Medicare Other | Source: Ambulatory Visit | Attending: Internal Medicine | Admitting: Internal Medicine

## 2014-01-07 VITALS — BP 110/62 | HR 52 | Ht 69.0 in | Wt 216.1 lb

## 2014-01-07 DIAGNOSIS — I5022 Chronic systolic (congestive) heart failure: Secondary | ICD-10-CM | POA: Insufficient documentation

## 2014-01-07 DIAGNOSIS — I2589 Other forms of chronic ischemic heart disease: Secondary | ICD-10-CM | POA: Insufficient documentation

## 2014-01-07 DIAGNOSIS — I255 Ischemic cardiomyopathy: Secondary | ICD-10-CM

## 2014-01-07 DIAGNOSIS — Z86711 Personal history of pulmonary embolism: Secondary | ICD-10-CM | POA: Insufficient documentation

## 2014-01-07 DIAGNOSIS — I2510 Atherosclerotic heart disease of native coronary artery without angina pectoris: Secondary | ICD-10-CM | POA: Insufficient documentation

## 2014-01-07 DIAGNOSIS — N183 Chronic kidney disease, stage 3 unspecified: Secondary | ICD-10-CM | POA: Insufficient documentation

## 2014-01-07 MED ORDER — RAMIPRIL 2.5 MG PO CAPS: 2.5000 mg | ORAL_CAPSULE | Freq: Every day | ORAL | Status: DC

## 2014-01-07 NOTE — Telephone Encounter (Signed)
Opened in error

## 2014-01-07 NOTE — Progress Notes (Signed)
Patient ID: Joseph Hamilton, male   DOB: 01/23/1941, 73 y.o.   MRN: [id number] Hematologist: Dr Myna Hidalgo Pulmonary: Dr Craige Cotta  HPI:  Joseph Hamilton is a 73 year old male with a history of coronary artery disease, status post previous myocardial infarction, as well as redo coronary bypass grafting in 2003.  He also has chronic systolic HF due to ICM with EF 35% (02/18/13).   The remainder of his medical history is notable for a history of pulmonary emboli in 2005 (which recurred after stopping anti-coagulation), CRI baseline 2.0-2.5, hypertension, hyperlipidemia, OSA, and chronic dizziness/headaches, which have been evaluated extensively.ICD reached EOL in 12/13 and was extracted and not re-implanted.   He was admitted to [facility] with acute PE 02/18/13 after coumadin stopped (remote PE > 9 years ago). D-Dimer 11.96. Lower extremity dopplers + DVT RLE popliteal vein. ECHO  EF 35% no evidence of RV strain. Discharged on Lovenox/coumadin until INR > 2.  He had an echo in 10/14 that was reviewed by Dr. Gala Romney and thought to show EF around 25-30%.  Cardiac MRI in 12/14 showed EF 30% with inferior and lateral hypokinesis.  Prior to replacing ICD, patient had LHC in 1/15 showing patent LIMA-LAD, severe stenosis SVG-RCA, other SVGs totally occluded.  He had DES to SVG-RCA.    Since intervention, central scapular pain (his prior anginal equivalent) seems to have resolved.  He can walk on a treadmill without dyspnea. He has some dyspnea walking up steps or up an incline.  He is having trouble tolerating CPAP but continues to try.  He takes an extra Lasix 40 mg about twice a week.  Of note, creatinine was recently up to 2.6 with K 5.4.  It appears that spironolactone was stopped during recent nephrology visit.  He has some balance difficulty that may be related to diabetic peripheral neuropathy and loss of position sense.     Labs 04/28/13: K 4.9, creatinine 1.86 Labs 9/14: LDL 46, HDL 30 Labs 1/15: K 4.6, creatinine 1.9 Labs  2/15: K 5.9, creatinine 2.6  ROS: All systems negative except as listed in HPI, PMH and Problem List.  Past Medical History  Diagnosis Date  . Coronary artery disease     a. s/p MI 1995 Y 2002;  b. s/p remote CABG;  c. 2003: s/p redo CABG x 3;  d. 09/2013 Cath: LM 50, LAD 100, LCX 100, RI 30-40, RCA 90ost/144m, Vg->OM 100, VG->Diag 100, VG->dRCA 80p (4.0x23 Xience DES), LIMA->LAD ok.  . Chronic systolic CHF (congestive heart failure)     a. 06/2013 Echo: EF 40%.  . Hypertension   . Hyperlipidemia   . Dizziness     CHRONIC; s/p EXTENSIVE EVAL, s/p EVAL @ [facility]; HEADACHE:  s/p EXTENSIVE EVAL; ON ELAVIL  . Obesity   . Ischemic cardiomyopathy     a. 06/2013 Echo: EF 40%, mod LVH, DD, mild AI, mild biatrial enlargement.  . Carotid artery occlusion     0-39% BILATERALLY W/CHRONICALLY OCCLUDED L VERTEBRAL  . PE (pulmonary embolism) 2005; 2013    a. chronic coumadin.  Marland Kitchen GERD (gastroesophageal reflux disease)   . Arthritis   . Complication of anesthesia     "when they took ICD out 09/01/2012, I had a great deal of trouble waking up"  . Automatic implantable cardioverter-defibrillator in situ     a. s/p removal in setting of improved LV fxn-  08/2012.  Marland Kitchen Heart murmur     "as a kid"  . DVT (deep venous thrombosis) 2013  RLE  . Type II diabetes mellitus   . Sleep apnea 03/31/10    UNABLE TO TOLERATE CPAP; > HOME SLEEP TEST AHI 15.6  . Nephrolithiasis   . CKD (chronic kidney disease), stage III     BASELINE Cr. 1.7 ----DR. MATTINGLY  . Dupuytren contracture     Current Outpatient Prescriptions  Medication Sig Dispense Refill  . aspirin EC 81 MG tablet Take 1 tablet (81 mg total) by mouth daily.  30 tablet  3  . carvedilol (COREG) 25 MG tablet Take 12.5-25 mg by mouth 2 (two) times daily with a meal. Take 1 tablet in the morning and 1/2 tablet in the evening      . clopidogrel (PLAVIX) 75 MG tablet Take 1 tablet (75 mg total) by mouth daily with breakfast.  30 tablet  5  . fish  oil-omega-3 fatty acids 1000 MG capsule Take 1 g by mouth daily.      . furosemide (LASIX) 40 MG tablet Take 40 mg by mouth See admin instructions. Patient takes 1 tablet every day with permission to take additional tablet if needed.      . gabapentin (NEURONTIN) 300 MG capsule Take 300 mg by mouth 3 (three) times daily.      . isosorbide mononitrate (IMDUR) 60 MG 24 hr tablet Take 1 tablet (60 mg total) by mouth daily.  30 tablet  6  . loratadine (CLARITIN) 10 MG tablet Take 10 mg by mouth daily.      . Multiple Vitamin (MULTIVITAMIN) tablet Take 1 tablet by mouth daily.      . nateglinide (STARLIX) 60 MG tablet Take 60 mg by mouth 3 (three) times daily with meals. Take with the two largest meals of the day. Pt sometimes forgets to take      . nitroGLYCERIN (NITROSTAT) 0.4 MG SL tablet Place 1 tablet (0.4 mg total) under the tongue every 5 (five) minutes x 3 doses as needed for chest pain.  25 tablet  3  . pantoprazole (PROTONIX) 40 MG tablet Take 1 tablet (40 mg total) by mouth daily.  30 tablet  6  . ramipril (ALTACE) 2.5 MG capsule Take 1 capsule (2.5 mg total) by mouth daily.  30 capsule  6  . rosuvastatin (CRESTOR) 40 MG tablet Take 40 mg by mouth daily.      Marland Kitchen warfarin (COUMADIN) 3 MG tablet Take 3-4.5 mg by mouth daily. Takes 4.5 mg on Tuesday and Saturday. 3 mg on all other days.      Marland Kitchen zolpidem (AMBIEN CR) 12.5 MG CR tablet Take 12.5 mg by mouth at bedtime.       No current facility-administered medications for this encounter.     Filed Vitals:   01/07/14 1149  BP: 110/62  Pulse: 52  Height: 5\' 9"  (1.753 m)  Weight: 216 lb 1.9 oz (98.031 kg)  SpO2: 99%    PHYSICAL EXAM: General:  No acute distress HEENT: normal Neck: supple. JVP 5-6  Carotids 2+ bilaterally; no bruits. No lymphadenopathy or thryomegaly appreciated. Cor: PMI normal. Regular. No rubs, gallops or murmurs. Lungs: clear. Decreased at bases.  Abdomen: soft, nontender, distended. No hepatosplenomegaly. No bruits  or masses. Good bowel sounds. Extremities: no cyanosis, clubbing, rash.  1+ ankle edema.  Neuro: alert & orientedx3, cranial nerves grossly intact. Moves all 4 extremities w/o difficulty. Affect pleasant. Skin: Chest keloid present central aspect.   ASSESSMENT & PLAN:  1) Chronic systolic HF: Ischemic cardiomyopathy, EF 30% on 12/14 cMRI.  In  12/13, his ICD reached EOL and the ICD was explanted and leads were extracted.  NYHA II symptoms and volume status stable.  He had recent PCI to SVG-RCA with hope to improve LV function and avoid replacing ICD.  - He is taking Coreg 25 qam, 12.5 qpm.  HR 52 so will not adjust today.  - With rise in creatinine to 2.6, I am going to decrease ramipril to 2.5 mg daily.  I will have him get a BMET in 1 week after lowering ramipril dose.  - Spironolactone stopped with rise in K and rise in creatinine.   - Continue Lasix 40 mg daily, may use additional 40 mg with rising weight (as he has been doing).  - Repeat echo in 5/15 (>3 mos post-PCI).  If EF remains <35%, will need ICD.  - Reinforced the need and importance of daily weights, a low sodium diet, and fluid restriction (less than 2 L a day). Instructed to call the HF clinic if weight increases more than 3 lbs overnight or 5 lbs in a week.  2) CAD: Scapular pain (anginal equivalent) has resolved post-PCI to SVG-RCA.  Can stop aspirin since > 1 month post-PCI.  If no bleeding complications, would continue Plavix + warfarin x 1 year total, stopping Plavix at that time.  Continue statin.  3) Hyperlipidemia: With the patient's current low GFR, 10 mg daily is the maximum dose of Crestor and he is taking 40 mg daily.  I am going to have him stop Crestor and start atorvastatin 40 mg daily with lipids/LFTs in 2 months.   4) OSA: Trying CPAP but difficult.  5) PE, recurrent: Needs lifelong anti-coagulation 6) CKD: Creatinine higher recently.  Follow closely.    Laurey Moralealton S Yong Wahlquist MD       -

## 2014-01-07 NOTE — Patient Instructions (Signed)
Decrease Ramipril to 2.5 mg daily  Lab in 1 week  Your physician has requested that you have an echocardiogram. Echocardiography is a painless test that uses sound waves to create images of your heart. It provides your doctor with information about the size and shape of your heart and how well your heart's chambers and valves are working. This procedure takes approximately one hour. There are no restrictions for this procedure. IN MAY  We will contact you in 2 months to schedule your next appointment.

## 2014-01-14 MED ORDER — ATORVASTATIN CALCIUM 40 MG PO TABS: 40.0000 mg | ORAL_TABLET | Freq: Every day | ORAL | Status: DC

## 2014-01-14 NOTE — Addendum Note (Signed)
Encounter addended by: Noralee Space, RN on: 01/14/2014  1:39 PM<BR>     Documentation filed: Medications, Orders

## 2014-01-16 ENCOUNTER — Telehealth (HOSPITAL_COMMUNITY): Payer: Self-pay | Admitting: *Deleted

## 2014-01-16 MED ORDER — ROSUVASTATIN CALCIUM 20 MG PO TABS: 20.0000 mg | ORAL_TABLET | Freq: Every day | ORAL | Status: DC

## 2014-01-16 NOTE — Telephone Encounter (Signed)
Pt called to states he is unable to take the Atorvastatin, he states he has back pain and pain in his wrist and just generally doesn't feel well since starting med, discussed w/Dr Shirlee Latch will have pt stop Atorvastatin and take Crestor 20 mg daily, pt aware and agreeable

## 2014-01-19 ENCOUNTER — Telehealth (HOSPITAL_COMMUNITY): Payer: Self-pay | Admitting: *Deleted

## 2014-01-19 NOTE — Telephone Encounter (Signed)
Called pt with lab results from 4/24 bun 50 cr 2.26 K 4.9 pt aware

## 2014-01-21 ENCOUNTER — Encounter: Payer: Medicare Other | Attending: Cardiovascular Disease | Admitting: *Deleted

## 2014-01-21 ENCOUNTER — Telehealth: Payer: Self-pay | Admitting: Pulmonary Disease

## 2014-01-21 DIAGNOSIS — G4733 Obstructive sleep apnea (adult) (pediatric): Secondary | ICD-10-CM

## 2014-01-21 DIAGNOSIS — E119 Type 2 diabetes mellitus without complications: Secondary | ICD-10-CM

## 2014-01-22 NOTE — Telephone Encounter (Signed)
Spoke with Joseph Hamilton  She states that she needs order placed for new CPAP  It is time to replace pt's current machine Order was sent to [facility]

## 2014-01-23 ENCOUNTER — Encounter: Payer: Self-pay | Admitting: *Deleted

## 2014-01-23 NOTE — Progress Notes (Signed)
Blood Glucose Monitoring Instruction  Appointment Start Time: 1545  Appointment End Time: 1600   Assessment:  Primary concerns today: Patient here for instruction on Blood Glucose Monitoring. He presents with his own monitor and testing supplies.  Medications: See List: Starlix with each meal     Intervention:    Explained rationale of testing BG to obtain data as to how their diabetes is being managed.  Provided Target Ranges for both pre and post meals  Explained factors that effect BG including food (carbohydrate), stress, activity level and insulin availability in the body including diabetes medications  Taught patient techniques for using BG monitor and lancing device  Discussed need for Rx for strips and lancets   Explained rationale of recording BG both for patient and MD to assess patterns as needed.  Follow Up: Patient offered follow up as needed.

## 2014-02-03 ENCOUNTER — Other Ambulatory Visit: Payer: Self-pay | Admitting: Internal Medicine

## 2014-02-05 ENCOUNTER — Other Ambulatory Visit: Payer: Self-pay | Admitting: *Deleted

## 2014-02-05 MED ORDER — WARFARIN SODIUM 3 MG PO TABS: ORAL_TABLET | ORAL | Status: DC

## 2014-02-05 NOTE — Telephone Encounter (Signed)
Talked with pt and with his Pharmacy regarding his refill Pt informed that [facility] states has only 1 month of [name] left and they are going to stop carrying [name] Pt instructed to talk with the Pharmacist at [facility]  and will discuss this at his next visit in the clinic and pt states understanding.

## 2014-02-10 ENCOUNTER — Ambulatory Visit (HOSPITAL_COMMUNITY)
Admission: RE | Admit: 2014-02-10 | Discharge: 2014-02-10 | Disposition: A | Discharge status: Home / Self Care | Payer: Medicare Other | Source: Ambulatory Visit | Attending: Internal Medicine | Admitting: Internal Medicine

## 2014-02-10 ENCOUNTER — Ambulatory Visit (INDEPENDENT_AMBULATORY_CARE_PROVIDER_SITE_OTHER): Payer: Medicare Other | Admitting: Pharmacist

## 2014-02-10 DIAGNOSIS — I2699 Other pulmonary embolism without acute cor pulmonale: Secondary | ICD-10-CM

## 2014-02-10 DIAGNOSIS — Z5181 Encounter for therapeutic drug level monitoring: Secondary | ICD-10-CM

## 2014-02-10 DIAGNOSIS — I255 Ischemic cardiomyopathy: Secondary | ICD-10-CM

## 2014-02-10 DIAGNOSIS — I2589 Other forms of chronic ischemic heart disease: Secondary | ICD-10-CM | POA: Insufficient documentation

## 2014-02-10 DIAGNOSIS — I82A19 Acute embolism and thrombosis of unspecified axillary vein: Secondary | ICD-10-CM

## 2014-02-10 DIAGNOSIS — I82A11 Acute embolism and thrombosis of right axillary vein: Secondary | ICD-10-CM

## 2014-02-10 DIAGNOSIS — I359 Nonrheumatic aortic valve disorder, unspecified: Secondary | ICD-10-CM

## 2014-02-10 DIAGNOSIS — I82409 Acute embolism and thrombosis of unspecified deep veins of unspecified lower extremity: Secondary | ICD-10-CM

## 2014-02-10 LAB — POCT INR: INR: 1.7

## 2014-02-10 NOTE — Progress Notes (Signed)
*  PRELIMINARY RESULTS* Echocardiogram 2D Echocardiogram has been performed.  Joseph Hamilton 02/10/2014, 4:05 PM

## 2014-02-16 IMAGING — CR DG CHEST 2V
2 series · 2 of 2 positions shown · non-contrast
Comparison: 08/28/2012

CLINICAL DATA: Post ICD extraction

CHEST - 2 VIEW

[w chest pa]
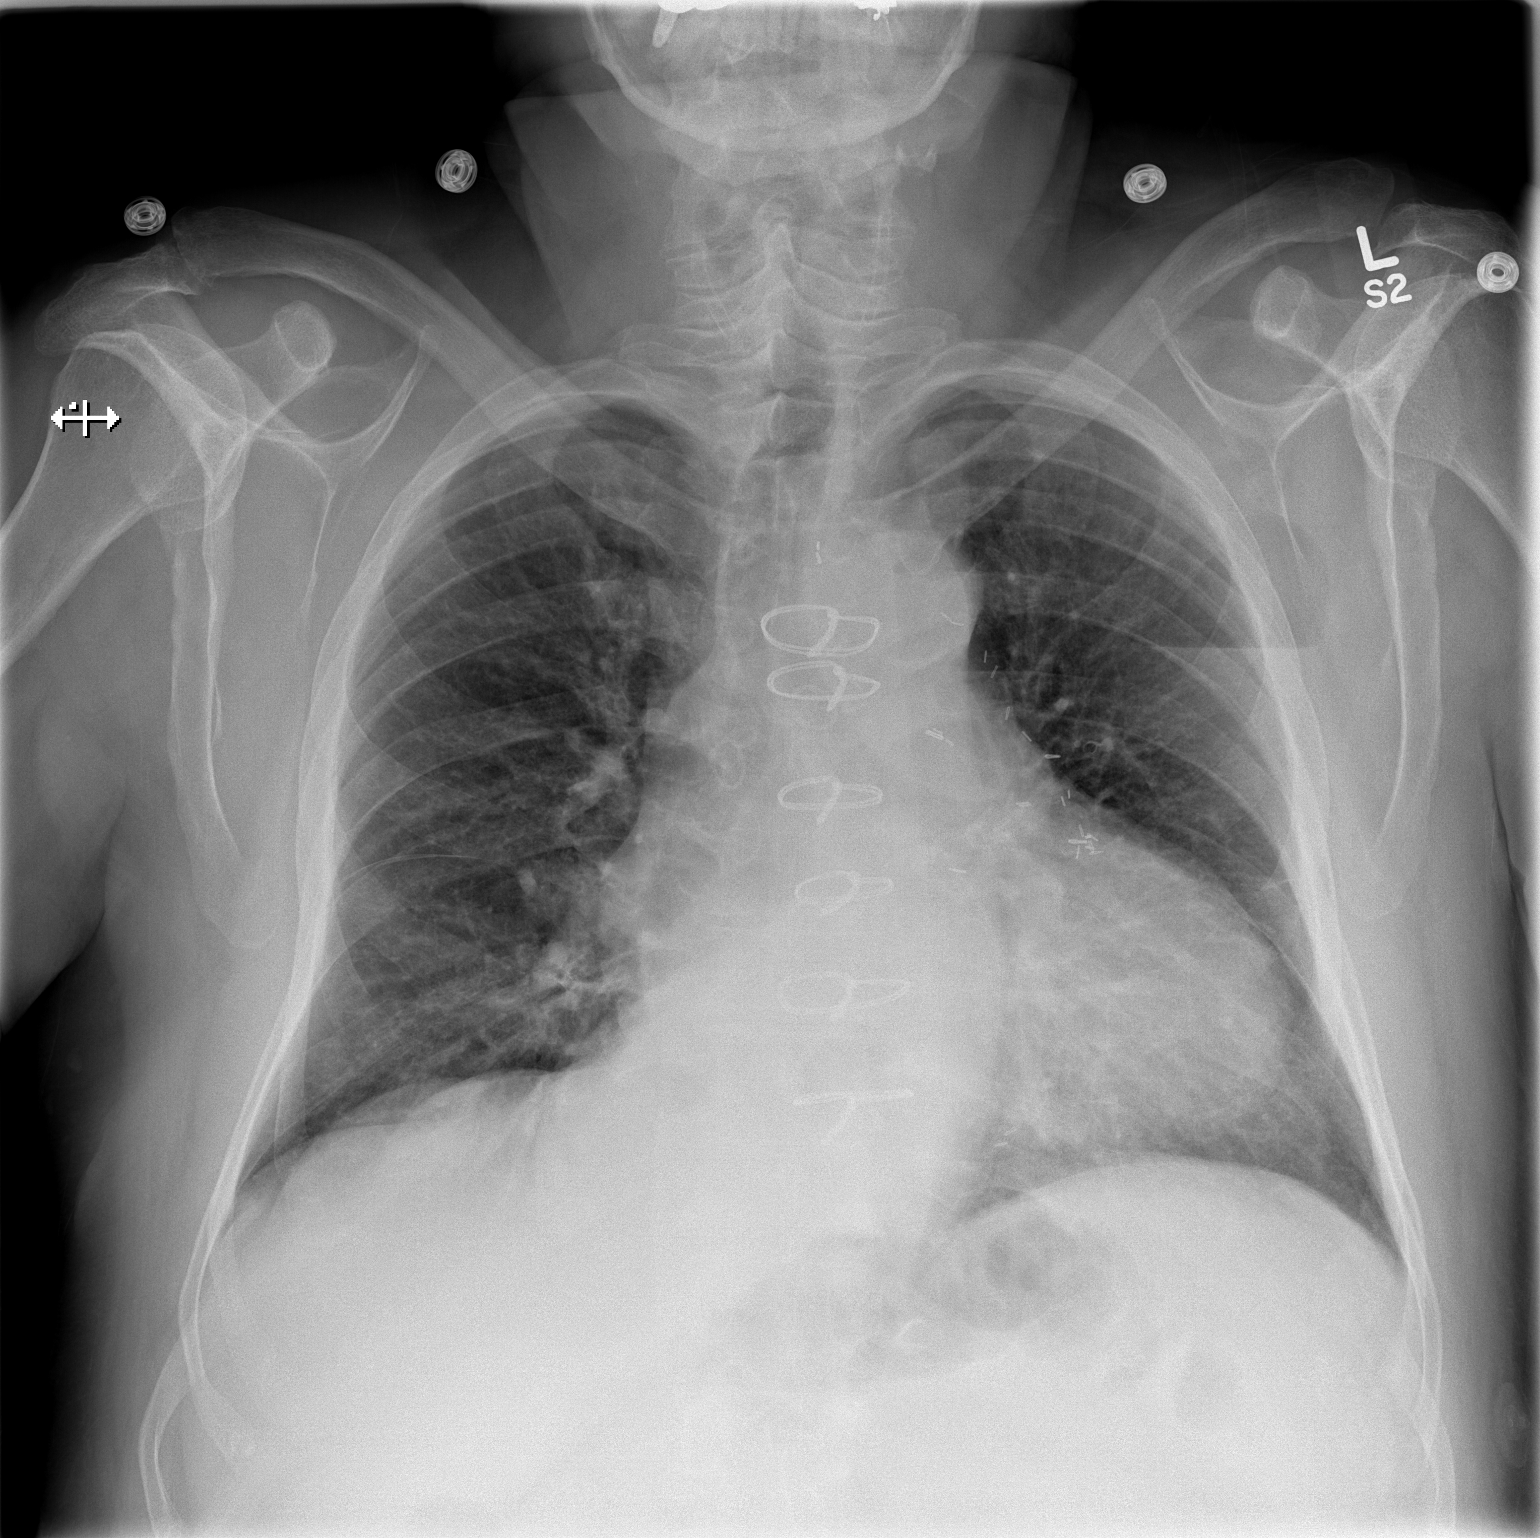

[w chest lat]
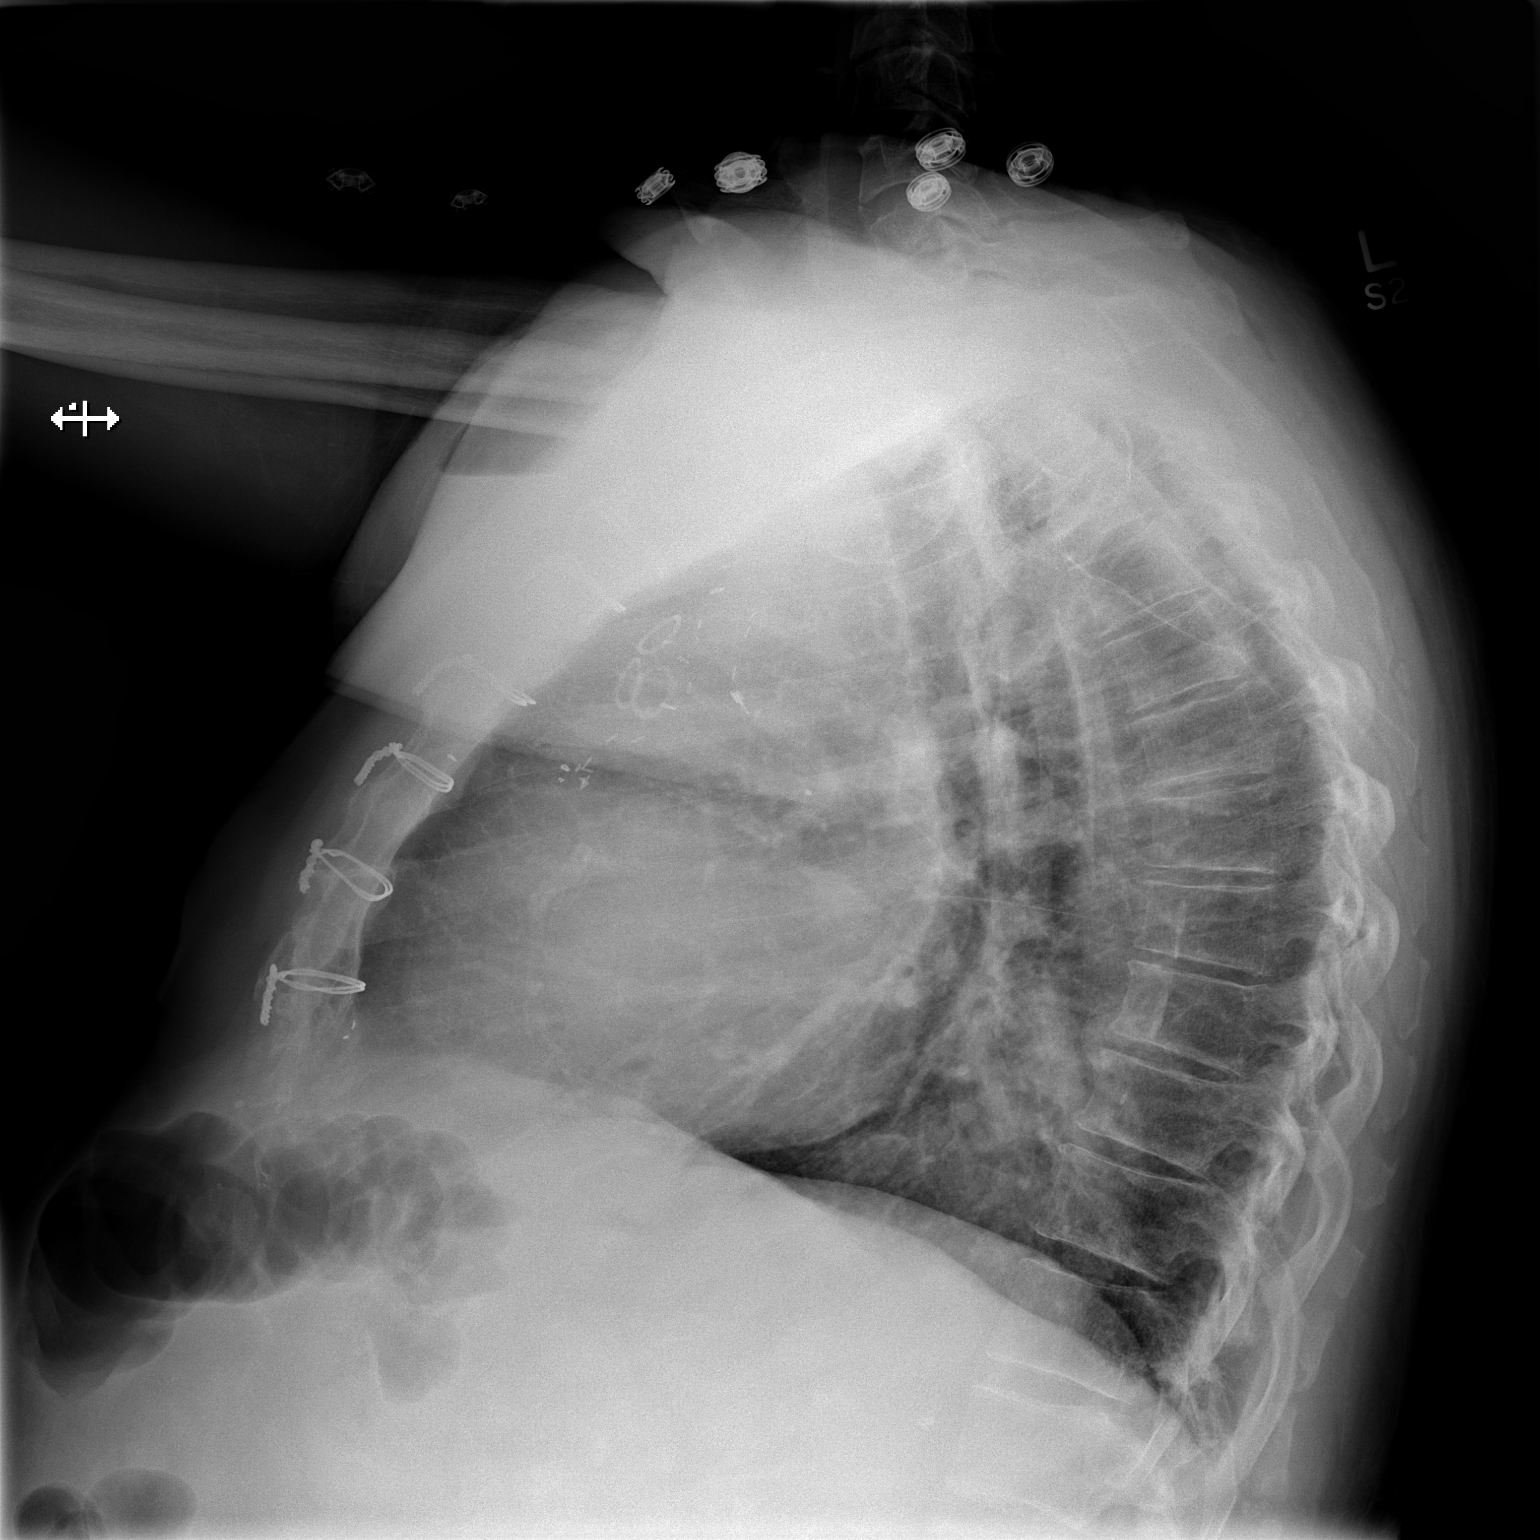

[2 of 2 positions shown; findings below may reference images not displayed]

FINDINGS: Enlargement of cardiac silhouette post CABG with slight pulmonary
vascular congestion.
Tortuous aorta with atherosclerotic calcification.
Large hiatal hernia.
Pulmonary vascularity normal.
Minimal peribronchial thickening and accentuation of perihilar
markings increased since previous exam.
No segmental consolidation, pleural effusion or pneumothorax.
Bones appear demineralized.
Air fluid level identified at the upper left chest laterally within
the pocket of the previously identified AICD generator.
IMPRESSION: Enlargement of cardiac silhouette post CABG and AICD removal.
Slight pulmonary vascular congestion and question minimal perihilar
edema.

## 2014-03-17 ENCOUNTER — Ambulatory Visit (INDEPENDENT_AMBULATORY_CARE_PROVIDER_SITE_OTHER): Payer: Medicare Other | Admitting: Pharmacist Clinician (PhC)/ Clinical Pharmacy Specialist

## 2014-03-17 DIAGNOSIS — I82A19 Acute embolism and thrombosis of unspecified axillary vein: Secondary | ICD-10-CM

## 2014-03-17 DIAGNOSIS — Z5181 Encounter for therapeutic drug level monitoring: Secondary | ICD-10-CM

## 2014-03-17 DIAGNOSIS — I2699 Other pulmonary embolism without acute cor pulmonale: Secondary | ICD-10-CM

## 2014-03-17 DIAGNOSIS — I82A11 Acute embolism and thrombosis of right axillary vein: Secondary | ICD-10-CM

## 2014-03-17 LAB — POCT INR: INR: 2.1

## 2014-03-30 ENCOUNTER — Encounter: Payer: Medicare Other | Attending: Cardiovascular Disease

## 2014-03-30 DIAGNOSIS — E119 Type 2 diabetes mellitus without complications: Secondary | ICD-10-CM | POA: Insufficient documentation

## 2014-03-31 NOTE — Progress Notes (Signed)
Patient was seen on 03/31/2014 for a review of the series of three diabetes self-management courses at the [facility]. The following learning objectives were met by the patient during this class:    Reviewed blood glucose monitoring and interpretation including the recommended target ranges and Hgb A1c.    Reviewed on carb counting, importance of regularly scheduled meals/snacks, and meal planning.    Reviewed the effects of physical activity on glucose levels and long-term glucose control.  Recommended goal of 150 minutes of physical activity/week.   Reviewed patient medications and discussed role of medication on blood glucose and possible side effects.   Discussed strategies to manage stress, psychosocial issues, and other obstacles to diabetes management.   Encouraged moderate weight reduction to improve glucose levels.     Reviewed short-term complications: hyper- and hypo-glycemia.  Discussed causes, symptoms, and treatment options.   Reviewed prevention, detection, and treatment of long-term complications.  Discussed the role of prolonged elevated glucose levels on body systems.  Goals:  Follow Diabetes Meal Plan as instructed  Eat 3 meals and 2 snacks, every 3-5 hrs  Limit carbohydrate intake to 45 grams carbohydrate/meal Limit carbohydrate intake to 15 grams carbohydrate/snack Add lean protein foods to meals/snacks  Monitor glucose levels as instructed by your doctor  Aim for goal of 15-30 mins of physical activity daily as tolerated  Bring food record and glucose log to your next nutrition visit   

## 2014-04-01 ENCOUNTER — Telehealth (HOSPITAL_COMMUNITY): Payer: Self-pay | Admitting: Cardiology

## 2014-04-01 NOTE — Telephone Encounter (Signed)
Pts PCP would like to start him on testosterone Pt would like to make sure this would not interact with the meds he is currently taking  Please advise

## 2014-04-01 NOTE — Telephone Encounter (Addendum)
Ok per Homero Fellers, pharm D, no interaction ok to use, pt is aware

## 2014-04-15 ENCOUNTER — Other Ambulatory Visit (HOSPITAL_COMMUNITY): Payer: Self-pay | Admitting: *Deleted

## 2014-04-15 MED ORDER — CLOPIDOGREL BISULFATE 75 MG PO TABS: 75.0000 mg | ORAL_TABLET | Freq: Every day | ORAL | Status: DC

## 2014-04-28 ENCOUNTER — Ambulatory Visit (INDEPENDENT_AMBULATORY_CARE_PROVIDER_SITE_OTHER): Payer: Medicare Other

## 2014-04-28 DIAGNOSIS — I82A11 Acute embolism and thrombosis of right axillary vein: Secondary | ICD-10-CM

## 2014-04-28 DIAGNOSIS — Z5181 Encounter for therapeutic drug level monitoring: Secondary | ICD-10-CM

## 2014-04-28 DIAGNOSIS — I82A19 Acute embolism and thrombosis of unspecified axillary vein: Secondary | ICD-10-CM

## 2014-04-28 DIAGNOSIS — I2699 Other pulmonary embolism without acute cor pulmonale: Secondary | ICD-10-CM

## 2014-04-28 LAB — POCT INR: INR: 2

## 2014-05-08 ENCOUNTER — Telehealth (HOSPITAL_COMMUNITY): Payer: Self-pay | Admitting: Vascular Surgery

## 2014-05-08 ENCOUNTER — Other Ambulatory Visit: Payer: Self-pay

## 2014-05-08 MED ORDER — PANTOPRAZOLE SODIUM 40 MG PO TBEC: 40.0000 mg | DELAYED_RELEASE_TABLET | Freq: Every day | ORAL | Status: AC

## 2014-05-08 MED ORDER — PANTOPRAZOLE SODIUM 40 MG PO TBEC: 40.0000 mg | DELAYED_RELEASE_TABLET | Freq: Every day | ORAL | Status: DC

## 2014-05-08 NOTE — Telephone Encounter (Signed)
As requested med refill sent

## 2014-05-08 NOTE — Telephone Encounter (Signed)
Refill Pantoprazole °

## 2014-05-12 ENCOUNTER — Ambulatory Visit (HOSPITAL_COMMUNITY)
Admission: RE | Admit: 2014-05-12 | Discharge: 2014-05-12 | Disposition: A | Discharge status: Home / Self Care | Payer: Medicare Other | Source: Ambulatory Visit | Attending: Internal Medicine | Admitting: Internal Medicine

## 2014-05-12 VITALS — BP 114/72 | HR 52 | Wt 218.0 lb

## 2014-05-12 DIAGNOSIS — I5022 Chronic systolic (congestive) heart failure: Secondary | ICD-10-CM

## 2014-05-12 DIAGNOSIS — Z951 Presence of aortocoronary bypass graft: Secondary | ICD-10-CM | POA: Insufficient documentation

## 2014-05-12 DIAGNOSIS — I2510 Atherosclerotic heart disease of native coronary artery without angina pectoris: Secondary | ICD-10-CM

## 2014-05-12 DIAGNOSIS — M720 Palmar fascial fibromatosis [Dupuytren]: Secondary | ICD-10-CM | POA: Diagnosis not present

## 2014-05-12 DIAGNOSIS — G4733 Obstructive sleep apnea (adult) (pediatric): Secondary | ICD-10-CM | POA: Diagnosis not present

## 2014-05-12 DIAGNOSIS — I25810 Atherosclerosis of coronary artery bypass graft(s) without angina pectoris: Secondary | ICD-10-CM

## 2014-05-12 DIAGNOSIS — N183 Chronic kidney disease, stage 3 unspecified: Secondary | ICD-10-CM

## 2014-05-12 DIAGNOSIS — Z7901 Long term (current) use of anticoagulants: Secondary | ICD-10-CM | POA: Insufficient documentation

## 2014-05-12 DIAGNOSIS — R51 Headache: Secondary | ICD-10-CM | POA: Insufficient documentation

## 2014-05-12 DIAGNOSIS — I2782 Chronic pulmonary embolism: Secondary | ICD-10-CM | POA: Diagnosis not present

## 2014-05-12 DIAGNOSIS — N200 Calculus of kidney: Secondary | ICD-10-CM | POA: Diagnosis not present

## 2014-05-12 DIAGNOSIS — M129 Arthropathy, unspecified: Secondary | ICD-10-CM | POA: Diagnosis not present

## 2014-05-12 DIAGNOSIS — E785 Hyperlipidemia, unspecified: Secondary | ICD-10-CM

## 2014-05-12 DIAGNOSIS — E119 Type 2 diabetes mellitus without complications: Secondary | ICD-10-CM | POA: Diagnosis not present

## 2014-05-12 DIAGNOSIS — E669 Obesity, unspecified: Secondary | ICD-10-CM | POA: Diagnosis not present

## 2014-05-12 DIAGNOSIS — R42 Dizziness and giddiness: Secondary | ICD-10-CM | POA: Diagnosis not present

## 2014-05-12 DIAGNOSIS — I129 Hypertensive chronic kidney disease with stage 1 through stage 4 chronic kidney disease, or unspecified chronic kidney disease: Secondary | ICD-10-CM | POA: Insufficient documentation

## 2014-05-12 DIAGNOSIS — Z86718 Personal history of other venous thrombosis and embolism: Secondary | ICD-10-CM | POA: Insufficient documentation

## 2014-05-12 DIAGNOSIS — K219 Gastro-esophageal reflux disease without esophagitis: Secondary | ICD-10-CM | POA: Diagnosis not present

## 2014-05-12 DIAGNOSIS — I509 Heart failure, unspecified: Secondary | ICD-10-CM | POA: Diagnosis not present

## 2014-05-12 DIAGNOSIS — Z86711 Personal history of pulmonary embolism: Secondary | ICD-10-CM

## 2014-05-12 DIAGNOSIS — I2589 Other forms of chronic ischemic heart disease: Secondary | ICD-10-CM | POA: Diagnosis not present

## 2014-05-12 DIAGNOSIS — I252 Old myocardial infarction: Secondary | ICD-10-CM | POA: Diagnosis not present

## 2014-05-12 NOTE — Patient Instructions (Signed)
Labs tomorrow at [facility]  Your physician recommends that you schedule a follow-up appointment in: 4 months with echo

## 2014-05-12 NOTE — Progress Notes (Signed)
Patient ID: Joseph Hamilton, male   DOB: 03-29-1941, 73 y.o.   MRN: [id number] Hematologist: Dr Myna Hidalgo Pulmonary: Dr Craige Cotta PCP: Julio Sicks Renal: Briant Cedar  HPI:  Joseph Hamilton is a 73 year old male with a history of coronary artery disease, status post previous myocardial infarction, as well as redo coronary bypass grafting in 2003.  He also has chronic systolic HF due to ICM with EF 35% (02/18/13).   The remainder of his medical history is notable for a history of pulmonary emboli in 2005 (which recurred after stopping anti-coagulation), CRI baseline 2.0-2.5, hypertension, hyperlipidemia, OSA, and chronic dizziness/headaches, which have been evaluated extensively.ICD reached EOL in 12/13 and was extracted and not re-implanted.   He was admitted to [facility] with acute PE 02/18/13 after coumadin stopped (remote PE > 9 years ago). D-Dimer 11.96. Lower extremity dopplers + DVT RLE popliteal vein. ECHO  EF 35% no evidence of RV strain. Discharged on Lovenox/coumadin until INR > 2.  He had an echo in 10/14 that was reviewed by Dr. Gala Romney and thought to show EF around 25-30%.  Cardiac MRI in 12/14 showed EF 30% with inferior and lateral hypokinesis.  Prior to replacing ICD, patient had LHC in 1/15 showing patent LIMA-LAD, severe stenosis SVG-RCA, other SVGs totally occluded.  He had DES to SVG-RCA.    Echo 5/15: EF 35-40%  Follow-up: He says he continue to get older - slowly. Still going to gym 3-4 days per week. Working out on bike, treadmill and [employer]. No angina (scapular pain).  He can walk on a treadmill without dyspnea. He has some dyspnea walking up steps or up an incline. He takes an extra Lasix 40 mg about once a week.  Of note, creatinine was recently up to 2.6 with K 5.4. Spironolactone was stopped by Nephrology. Ramipril decreased to 2.5 daily.  Weight stable.   Labs 04/28/13: K 4.9, creatinine 1.86 Labs 9/14: LDL 46, HDL 30 Labs 1/15: K 4.6, creatinine 1.9 Labs 2/15: K 5.9, creatinine  2.6  ROS: All systems negative except as listed in HPI, PMH and Problem List.  Past Medical History  Diagnosis Date  . Coronary artery disease     a. s/p MI 1995 Y 2002;  b. s/p remote CABG;  c. 2003: s/p redo CABG x 3;  d. 09/2013 Cath: LM 50, LAD 100, LCX 100, RI 30-40, RCA 90ost/1105m, Vg->OM 100, VG->Diag 100, VG->dRCA 80p (4.0x23 Xience DES), LIMA->LAD ok.  . Chronic systolic CHF (congestive heart failure)     a. 06/2013 Echo: EF 40%.  . Hypertension   . Hyperlipidemia   . Dizziness     CHRONIC; s/p EXTENSIVE EVAL, s/p EVAL @ [facility]; HEADACHE:  s/p EXTENSIVE EVAL; ON ELAVIL  . Obesity   . Ischemic cardiomyopathy     a. 06/2013 Echo: EF 40%, mod LVH, DD, mild AI, mild biatrial enlargement.  . Carotid artery occlusion     0-39% BILATERALLY W/CHRONICALLY OCCLUDED L VERTEBRAL  . PE (pulmonary embolism) 2005; 2013    a. chronic coumadin.  Marland Kitchen GERD (gastroesophageal reflux disease)   . Arthritis   . Complication of anesthesia     "when they took ICD out 09/01/2012, I had a great deal of trouble waking up"  . Automatic implantable cardioverter-defibrillator in situ     a. s/p removal in setting of improved LV fxn-  08/2012.  Marland Kitchen Heart murmur     "as a kid"  . DVT (deep venous thrombosis) 2013    RLE  . Type II  diabetes mellitus   . Sleep apnea 03/31/10    UNABLE TO TOLERATE CPAP; > HOME SLEEP TEST AHI 15.6  . Nephrolithiasis   . CKD (chronic kidney disease), stage III     BASELINE Cr. 1.7 ----DR. MATTINGLY  . Dupuytren contracture     Current Outpatient Prescriptions  Medication Sig Dispense Refill  . carvedilol (COREG) 25 MG tablet Take 1 tablet in the morning and 1/2 tablet in the evening      . clopidogrel (PLAVIX) 75 MG tablet Take 1 tablet (75 mg total) by mouth daily with breakfast.  30 tablet  6  . fish oil-omega-3 fatty acids 1000 MG capsule Take 1 g by mouth daily.      . furosemide (LASIX) 40 MG tablet Take 40 mg by mouth See admin instructions. Patient takes 1 tablet  every day with permission to take additional tablet if needed.      . gabapentin (NEURONTIN) 300 MG capsule Take 300 mg by mouth 3 (three) times daily.      . isosorbide mononitrate (IMDUR) 60 MG 24 hr tablet Take 1 tablet (60 mg total) by mouth daily.  30 tablet  6  . montelukast (SINGULAIR) 10 MG tablet Take 10 mg by mouth at bedtime.      . Multiple Vitamin (MULTIVITAMIN) tablet Take 1 tablet by mouth daily.      . nateglinide (STARLIX) 60 MG tablet Take 60 mg by mouth 3 (three) times daily with meals. Take with the two largest meals of the day. Pt sometimes forgets to take      . nitroGLYCERIN (NITROSTAT) 0.4 MG SL tablet Place 1 tablet (0.4 mg total) under the tongue every 5 (five) minutes x 3 doses as needed for chest pain.  25 tablet  3  . pantoprazole (PROTONIX) 40 MG tablet Take 1 tablet (40 mg total) by mouth daily.  30 tablet  2  . ramipril (ALTACE) 2.5 MG capsule Take 1 capsule (2.5 mg total) by mouth daily.  30 capsule  6  . rosuvastatin (CRESTOR) 20 MG tablet Take 10 mg by mouth daily.      Marland Kitchen. testosterone (ANDROGEL) 50 MG/5GM (1%) GEL Place 5 g onto the skin daily.      Marland Kitchen. warfarin (COUMADIN) 3 MG tablet Take as directed by coumadin clinic.  40 tablet  3  . zolpidem (AMBIEN CR) 12.5 MG CR tablet Take 12.5 mg by mouth at bedtime.       No current facility-administered medications for this encounter.     Filed Vitals:   05/12/14 1356  BP: 114/72  Pulse: 52  Weight: 218 lb (98.884 kg)  SpO2: 98%    PHYSICAL EXAM: General:  No acute distress HEENT: normal Neck: supple. JVP 7 Carotids 2+ bilaterally; no bruits. No lymphadenopathy or thryomegaly appreciated. Cor: PMI normal. Regular. Bradycardic. No rubs, gallops or murmurs. Lungs: clear. Decreased at bases.  Abdomen: soft, nontender, distended. No hepatosplenomegaly. No bruits or masses. Good bowel sounds. Extremities: no cyanosis, clubbing, rash.  Tr-1+ LE edema L>R.  Neuro: alert & orientedx3, cranial nerves grossly  intact. Moves all 4 extremities w/o difficulty. Affect pleasant. Skin: Chest keloid present central aspect.   ASSESSMENT & PLAN:  1) Chronic systolic HF: Ischemic cardiomyopathy, EF 30% on 12/14 cMRI.  In 12/13, his ICD reached EOL and the ICD was explanted and leads were extracted.  NYHA II symptoms and volume status stable.  He had recent PCI to SVG-RCA with hope to improve LV function and avoid  replacing ICD.  - He is taking Coreg 25 qam, 12.5 qpm.  HR 52 so will not adjust today.  - On ramipril to 2.5 mg daily. Will continue this dose.  - Spironolactone stopped with rise in K and rise in creatinine.   - Continue Lasix 40 mg daily, may use additional 40 mg with rising weight (as he has been doing).  - Echo EF 35-40% hold off on ICD. Repeat next year.  - Reinforced the need and importance of daily weights, a low sodium diet, and fluid restriction (less than 2 L a day). Instructed to call the HF clinic if weight increases more than 3 lbs overnight or 5 lbs in a week.  2) CAD: Scapular pain (anginal equivalent) has resolved post-PCI to SVG-RCA 1/15. If no bleeding complications, would continue Plavix + warfarin x 1 year total, stopping Plavix at that time.  Continue statin.  3) Hyperlipidemia: Crestor reduced to 20 due to low GFR. Will recheck lipids and CMET. 4) OSA: Trying CPAP but difficult.  5) PE, recurrent: Needs lifelong anti-coagulation 6) CKD: Followed by Nephrology.   Arvilla Meres MD       -

## 2014-05-12 NOTE — Addendum Note (Signed)
Encounter addended by: Noralee Space, RN on: 05/12/2014  2:43 PM<BR>     Documentation filed: Visit Diagnoses, Patient Instructions Section, Orders

## 2014-05-13 ENCOUNTER — Other Ambulatory Visit (INDEPENDENT_AMBULATORY_CARE_PROVIDER_SITE_OTHER): Payer: Medicare Other

## 2014-05-13 DIAGNOSIS — I5022 Chronic systolic (congestive) heart failure: Secondary | ICD-10-CM

## 2014-05-13 DIAGNOSIS — I25810 Atherosclerosis of coronary artery bypass graft(s) without angina pectoris: Secondary | ICD-10-CM

## 2014-05-13 LAB — LIPID PANEL
CHOLESTEROL: 121 mg/dL (ref 0–200)
HDL: 28.6 mg/dL — ABNORMAL LOW (ref >39.00)
NonHDL: 92.4
Total CHOL/HDL Ratio: 4
Triglycerides: 209 mg/dL — ABNORMAL HIGH (ref 0.0–149.0)
VLDL: 41.8 mg/dL — ABNORMAL HIGH (ref 0.0–40.0)

## 2014-05-13 LAB — HEPATIC FUNCTION PANEL
ALBUMIN: 4 g/dL (ref 3.5–5.2)
ALT: 16 U/L (ref 0–53)
AST: 13 U/L (ref 0–37)
Alkaline Phosphatase: 33 U/L — ABNORMAL LOW (ref 39–117)
Bilirubin, Direct: 0 mg/dL (ref 0.0–0.3)
Total Bilirubin: 0.5 mg/dL (ref 0.2–1.2)
Total Protein: 7.1 g/dL (ref 6.0–8.3)

## 2014-05-13 LAB — BASIC METABOLIC PANEL
BUN: 45 mg/dL — AB (ref 6–23)
CHLORIDE: 104 meq/L (ref 96–112)
CO2: 28 mEq/L (ref 19–32)
Calcium: 9 mg/dL (ref 8.4–10.5)
Creatinine, Ser: 2.4 mg/dL — ABNORMAL HIGH (ref 0.4–1.5)
GFR: 27.72 mL/min — ABNORMAL LOW (ref >60.00)
Glucose, Bld: 104 mg/dL — ABNORMAL HIGH (ref 70–99)
POTASSIUM: 4.5 meq/L (ref 3.5–5.1)
Sodium: 137 mEq/L (ref 135–145)

## 2014-05-13 LAB — LDL CHOLESTEROL, DIRECT: Direct LDL: 65.3 mg/dL

## 2014-06-09 ENCOUNTER — Ambulatory Visit (INDEPENDENT_AMBULATORY_CARE_PROVIDER_SITE_OTHER): Payer: Medicare Other | Admitting: *Deleted

## 2014-06-09 DIAGNOSIS — I2699 Other pulmonary embolism without acute cor pulmonale: Secondary | ICD-10-CM

## 2014-06-09 DIAGNOSIS — I82A19 Acute embolism and thrombosis of unspecified axillary vein: Secondary | ICD-10-CM

## 2014-06-09 DIAGNOSIS — I82A11 Acute embolism and thrombosis of right axillary vein: Secondary | ICD-10-CM

## 2014-06-09 DIAGNOSIS — Z5181 Encounter for therapeutic drug level monitoring: Secondary | ICD-10-CM

## 2014-06-09 LAB — POCT INR: INR: 2.1

## 2014-07-21 ENCOUNTER — Ambulatory Visit (INDEPENDENT_AMBULATORY_CARE_PROVIDER_SITE_OTHER): Payer: Medicare Other | Admitting: Pharmacist

## 2014-07-21 DIAGNOSIS — Z5181 Encounter for therapeutic drug level monitoring: Secondary | ICD-10-CM

## 2014-07-21 DIAGNOSIS — I82A11 Acute embolism and thrombosis of right axillary vein: Secondary | ICD-10-CM

## 2014-07-21 DIAGNOSIS — I808 Phlebitis and thrombophlebitis of other sites: Secondary | ICD-10-CM

## 2014-07-21 DIAGNOSIS — I2699 Other pulmonary embolism without acute cor pulmonale: Secondary | ICD-10-CM

## 2014-07-21 DIAGNOSIS — I82401 Acute embolism and thrombosis of unspecified deep veins of right lower extremity: Secondary | ICD-10-CM

## 2014-07-21 LAB — POCT INR: INR: 2.2

## 2014-08-04 ENCOUNTER — Other Ambulatory Visit (HOSPITAL_COMMUNITY): Payer: Self-pay | Admitting: Internal Medicine

## 2014-08-04 ENCOUNTER — Other Ambulatory Visit (HOSPITAL_COMMUNITY): Payer: Self-pay | Admitting: Cardiology

## 2014-08-08 IMAGING — CR DG CHEST 1V
2 series · 2 of 2 positions shown · non-contrast
Comparison: Plain film 08/29/2012

CLINICAL DATA: Pulmonary embolism.  Short of breath

CHEST - 1 VIEW

[view not recorded (1 of 2)]
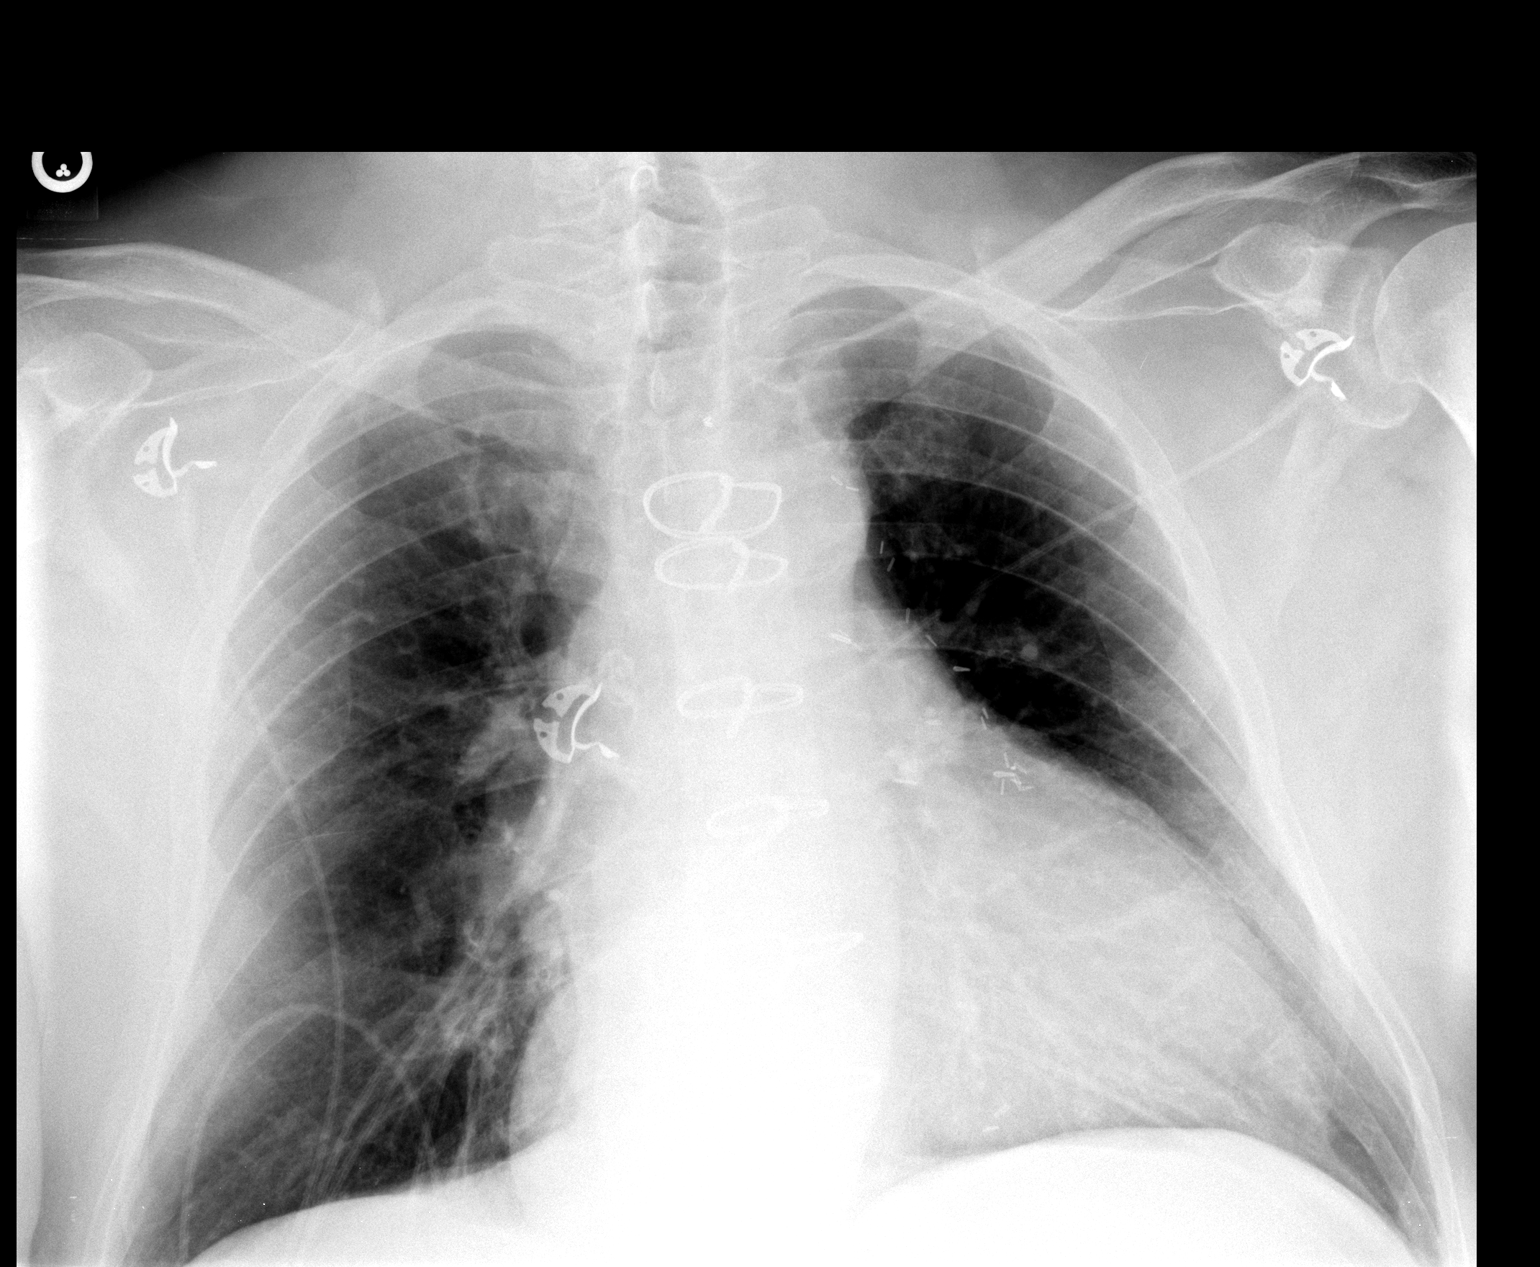

[view not recorded (2 of 2)]
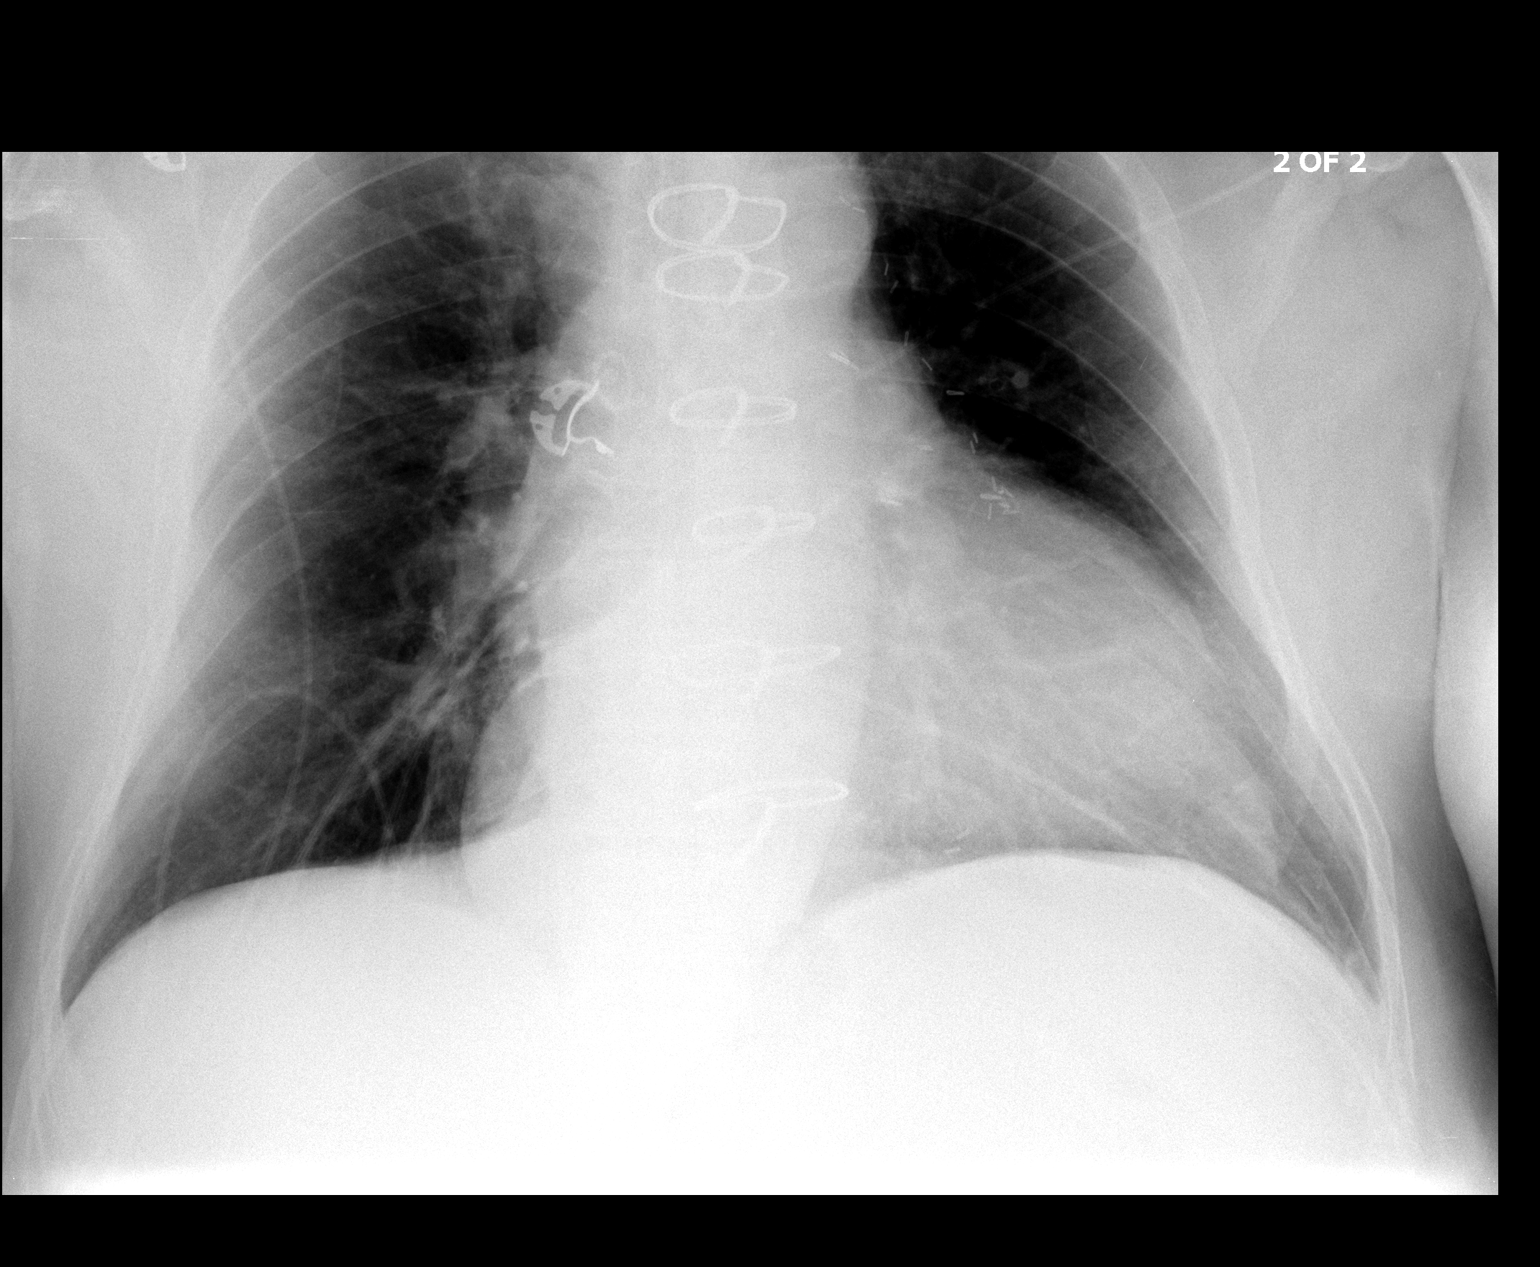

[2 of 2 positions shown; findings below may reference images not displayed]

FINDINGS: Sternal wires overlie stable enlarged heart silhouette.
No effusion, infiltrate, or pneumothorax.
IMPRESSION: Cardiomegaly without acute cardiopulmonary process.

## 2014-08-17 ENCOUNTER — Telehealth: Payer: Self-pay | Admitting: Pulmonary Disease

## 2014-08-17 NOTE — Telephone Encounter (Signed)
ATC fast busy signal WCB 

## 2014-08-18 NOTE — Telephone Encounter (Signed)
LMOMTCB x 1 

## 2014-08-18 NOTE — Telephone Encounter (Signed)
CPAP 05/19/14 to 08/16/14 >> used on 88 of 90 nights with average 7 hrs and 47 min.  Average AHI is 5.7 with CPAP 12 cm H2O.  Please inform pt that CPAP report shows good control of sleep apnea.  He is to continue CPAP 12 cm H2O.

## 2014-08-18 NOTE — Telephone Encounter (Signed)
Called and spoke to pt. Pt is requesting a download from CPAP to make sure everything looks ok. 90 compliance report printed and placed in VS look ats. Pt was last seen in 08/2013. Pt doesn't have f/u appt--offered pt appt in January but pt stated he would call back to schedule f/u d/t upcoming vacation.   VS please advise on download.

## 2014-08-19 NOTE — Telephone Encounter (Signed)
Called and spoke to pt. Informed pt of the results per VS. Pt verbalized understanding and denied any further questions or concerns at this time.  

## 2014-09-01 ENCOUNTER — Ambulatory Visit (INDEPENDENT_AMBULATORY_CARE_PROVIDER_SITE_OTHER): Payer: Medicare Other | Admitting: *Deleted

## 2014-09-01 DIAGNOSIS — I2699 Other pulmonary embolism without acute cor pulmonale: Secondary | ICD-10-CM

## 2014-09-01 DIAGNOSIS — Z5181 Encounter for therapeutic drug level monitoring: Secondary | ICD-10-CM

## 2014-09-01 DIAGNOSIS — I82A11 Acute embolism and thrombosis of right axillary vein: Secondary | ICD-10-CM

## 2014-09-01 DIAGNOSIS — I808 Phlebitis and thrombophlebitis of other sites: Secondary | ICD-10-CM

## 2014-09-01 LAB — POCT INR: INR: 2.5

## 2014-09-03 ENCOUNTER — Encounter (HOSPITAL_COMMUNITY): Payer: Self-pay | Admitting: Cardiology

## 2014-09-14 ENCOUNTER — Ambulatory Visit (HOSPITAL_COMMUNITY): Payer: Medicare Other

## 2014-09-14 ENCOUNTER — Encounter (HOSPITAL_COMMUNITY): Payer: Medicare Other

## 2014-10-13 ENCOUNTER — Ambulatory Visit (INDEPENDENT_AMBULATORY_CARE_PROVIDER_SITE_OTHER): Payer: Medicare Other | Admitting: *Deleted

## 2014-10-13 DIAGNOSIS — I808 Phlebitis and thrombophlebitis of other sites: Secondary | ICD-10-CM

## 2014-10-13 DIAGNOSIS — I82A11 Acute embolism and thrombosis of right axillary vein: Secondary | ICD-10-CM

## 2014-10-13 DIAGNOSIS — I2699 Other pulmonary embolism without acute cor pulmonale: Secondary | ICD-10-CM

## 2014-10-13 DIAGNOSIS — Z5181 Encounter for therapeutic drug level monitoring: Secondary | ICD-10-CM

## 2014-10-13 LAB — POCT INR: INR: 2.3

## 2014-10-15 ENCOUNTER — Ambulatory Visit (HOSPITAL_COMMUNITY)
Admission: RE | Admit: 2014-10-15 | Discharge: 2014-10-15 | Disposition: A | Discharge status: Home / Self Care | Payer: Medicare Other | Source: Ambulatory Visit | Attending: Internal Medicine | Admitting: Internal Medicine

## 2014-10-15 ENCOUNTER — Ambulatory Visit (HOSPITAL_BASED_OUTPATIENT_CLINIC_OR_DEPARTMENT_OTHER)
Admission: RE | Admit: 2014-10-15 | Discharge: 2014-10-15 | Disposition: A | Discharge status: Home / Self Care | Payer: Medicare Other | Source: Ambulatory Visit | Attending: Internal Medicine | Admitting: Internal Medicine

## 2014-10-15 ENCOUNTER — Encounter (HOSPITAL_COMMUNITY): Payer: Self-pay

## 2014-10-15 VITALS — BP 114/68 | HR 50 | Wt 222.8 lb

## 2014-10-15 DIAGNOSIS — I2699 Other pulmonary embolism without acute cor pulmonale: Secondary | ICD-10-CM

## 2014-10-15 DIAGNOSIS — I255 Ischemic cardiomyopathy: Secondary | ICD-10-CM | POA: Insufficient documentation

## 2014-10-15 DIAGNOSIS — N183 Chronic kidney disease, stage 3 unspecified: Secondary | ICD-10-CM

## 2014-10-15 DIAGNOSIS — I5022 Chronic systolic (congestive) heart failure: Secondary | ICD-10-CM

## 2014-10-15 LAB — BASIC METABOLIC PANEL
ANION GAP: 10 (ref 5–15)
BUN: 50 mg/dL — ABNORMAL HIGH (ref 6–23)
CALCIUM: 9.8 mg/dL (ref 8.4–10.5)
CO2: 23 mmol/L (ref 19–32)
Chloride: 108 mEq/L (ref 96–112)
Creatinine, Ser: 2.44 mg/dL — ABNORMAL HIGH (ref 0.50–1.35)
GFR calc Af Amer: 28 mL/min — ABNORMAL LOW (ref >90)
GFR calc non Af Amer: 25 mL/min — ABNORMAL LOW (ref >90)
GLUCOSE: 89 mg/dL (ref 70–99)
Potassium: 5.1 mmol/L (ref 3.5–5.1)
SODIUM: 141 mmol/L (ref 135–145)

## 2014-10-15 MED ORDER — ASPIRIN EC 81 MG PO TBEC: 81.0000 mg | DELAYED_RELEASE_TABLET | Freq: Every day | ORAL | Status: AC

## 2014-10-15 MED ORDER — METOLAZONE 2.5 MG PO TABS: 2.5000 mg | ORAL_TABLET | ORAL | Status: AC | PRN

## 2014-10-15 MED ORDER — NITROGLYCERIN 0.4 MG SL SUBL: 0.4000 mg | SUBLINGUAL_TABLET | SUBLINGUAL | Status: AC | PRN

## 2014-10-15 NOTE — Patient Instructions (Signed)
Stop Plavix  Start Aspirin 81 mg daily  Take Metolazone 2.5 mg as needed, DO NOT TAKE MORE THAN ONCE A WEEK  Your physician recommends that you schedule a follow-up appointment in: 3 months

## 2014-10-15 NOTE — Progress Notes (Signed)
  Echocardiogram 2D Echocardiogram has been performed.  Arvil Chaco 10/15/2014, 3:11 PM

## 2014-10-15 NOTE — Progress Notes (Signed)
Patient ID: Joseph Hamilton, male   DOB: 07-18-1941, 74 y.o.   MRN: [id number] Hematologist: Joseph Hamilton Pulmonary: Joseph Hamilton PCP: Joseph Hamilton Renal: Joseph Hamilton  HPI:  Joseph Hamilton is a 74 year old male with a history of coronary artery disease, status post previous myocardial infarction, as well as redo coronary bypass grafting in 2003.  He also has chronic systolic HF due to ICM with EF 35% (02/18/13).   The remainder of his medical history is notable for a history of pulmonary emboli in 2005 (which recurred after stopping anti-coagulation), CRI baseline 2.0-2.5, hypertension, hyperlipidemia, OSA, and chronic dizziness/headaches, which have been evaluated extensively.ICD reached EOL in 12/13 and was extracted and not re-implanted.   He was admitted to [facility] with acute PE 02/18/13 after coumadin stopped (remote PE > 9 years ago). D-Dimer 11.96. Lower extremity dopplers + DVT RLE popliteal vein. ECHO  EF 35% no evidence of RV strain. Discharged on Lovenox/coumadin until INR > 2.  He had an echo in 10/14 that was reviewed by Joseph. Gala Hamilton and thought to show EF around 25-30%.  Cardiac MRI in 12/14 showed EF 30% with inferior and lateral hypokinesis.  Prior to replacing ICD, patient had LHC in 1/15 showing patent LIMA-LAD, severe stenosis SVG-RCA, other SVGs totally occluded.  He had DES to SVG-RCA.    Echo 5/15: EF 35-40% ECHO Today:  EF 30-35% moderate RV dysfunction.   Follow-up: He returns for follow up. Denies SOB/PND/Orthopnea. Still going to gym 3-4 days per week. Working out on bike, treadmill and [employer]. No angina (scapular pain).  He can walk on a treadmill without dyspnea. Weight at home trending up 216-222 pounds. Taking extra lasix about 3 days a week.  Taking all medications. No bleeding problems reported. Using CPAP nightly.   Labs 04/28/13: K 4.9, creatinine 1.86 Labs 9/14: LDL 46, HDL 30 Labs 1/15: K 4.6, creatinine 1.9 Labs 2/15: K 5.9, creatinine 2.6 Labs 05/13/2014: K 4.5 Creatinine 2.4    ROS: All systems negative except as listed in HPI, PMH and Problem List.  Past Medical History  Diagnosis Date  . Coronary artery disease     a. s/p MI 1995 Y 2002;  b. s/p remote CABG;  c. 2003: s/p redo CABG x 3;  d. 09/2013 Cath: LM 50, LAD 100, LCX 100, RI 30-40, RCA 90ost/142m, Vg->OM 100, VG->Diag 100, VG->dRCA 80p (4.0x23 Xience DES), LIMA->LAD ok.  . Chronic systolic CHF (congestive heart failure)     a. 06/2013 Echo: EF 40%.  . Hypertension   . Hyperlipidemia   . Dizziness     CHRONIC; s/p EXTENSIVE EVAL, s/p EVAL @ [facility]; HEADACHE:  s/p EXTENSIVE EVAL; ON ELAVIL  . Obesity   . Ischemic cardiomyopathy     a. 06/2013 Echo: EF 40%, mod LVH, DD, mild AI, mild biatrial enlargement.  . Carotid artery occlusion     0-39% BILATERALLY W/CHRONICALLY OCCLUDED L VERTEBRAL  . PE (pulmonary embolism) 2005; 2013    a. chronic coumadin.  Marland Kitchen GERD (gastroesophageal reflux disease)   . Arthritis   . Complication of anesthesia     "when they took ICD out 09/01/2012, I had a great deal of trouble waking up"  . Automatic implantable cardioverter-defibrillator in situ     a. s/p removal in setting of improved LV fxn-  08/2012.  Marland Kitchen Heart murmur     "as a kid"  . DVT (deep venous thrombosis) 2013    RLE  . Type II diabetes mellitus   . Sleep apnea  03/31/10    UNABLE TO TOLERATE CPAP; > HOME SLEEP TEST AHI 15.6  . Nephrolithiasis   . CKD (chronic kidney disease), stage III     BASELINE Cr. 1.7 ----Joseph Hamilton  . Dupuytren contracture     Current Outpatient Prescriptions  Medication Sig Dispense Refill  . carvedilol (COREG) 25 MG tablet Take 1 tablet in the morning and 1/2 tablet in the evening    . clopidogrel (PLAVIX) 75 MG tablet Take 1 tablet (75 mg total) by mouth daily with breakfast. 30 tablet 6  . furosemide (LASIX) 40 MG tablet Take 40 mg by mouth See admin instructions. Patient takes 1 tablet every day with permission to take additional tablet if needed.    . gabapentin  (NEURONTIN) 300 MG capsule Take 300 mg by mouth at bedtime.    . isosorbide mononitrate (IMDUR) 60 MG 24 hr tablet Take 1 tablet (60 mg total) by mouth daily. 30 tablet 6  . Multiple Vitamin (MULTIVITAMIN) tablet Take 1 tablet by mouth daily.    . nateglinide (STARLIX) 60 MG tablet Take 60 mg by mouth 3 (three) times daily with meals. Take with the two largest meals of the day. Pt sometimes forgets to take    . nitroGLYCERIN (NITROSTAT) 0.4 MG SL tablet Place 1 tablet (0.4 mg total) under the tongue every 5 (five) minutes x 3 doses as needed for chest pain. 25 tablet 3  . pantoprazole (PROTONIX) 40 MG tablet Take 1 tablet (40 mg total) by mouth daily. 30 tablet 2  . ramipril (ALTACE) 2.5 MG capsule TAKE ONE CAPSULE BY MOUTH DAILY 30 capsule 6  . rosuvastatin (CRESTOR) 20 MG tablet Take 10 mg by mouth daily.    Marland Kitchen testosterone (ANDROGEL) 50 MG/5GM (1%) GEL Place 5 g onto the skin daily.    Marland Kitchen warfarin (COUMADIN) 3 MG tablet TAKE AS DIRECTED BY COUMADIN CLINIC 40 tablet 6  . zolpidem (AMBIEN CR) 12.5 MG CR tablet Take 12.5 mg by mouth at bedtime.     No current facility-administered medications for this encounter.     Filed Vitals:   10/15/14 1514  BP: 114/68  Pulse: 50  Weight: 222 lb 12.8 oz (101.061 kg)  SpO2: 99%    PHYSICAL EXAM: General:  No acute distress HEENT: normal Neck: supple. JVP ~8-9 Carotids 2+ bilaterally; no bruits. No lymphadenopathy or thryomegaly appreciated. Cor: PMI normal. Regular. Bradycardic. No rubs, gallops or murmurs. Lungs: clear. Decreased at bases.  Abdomen: soft, nontender, distended. No hepatosplenomegaly. No bruits or masses. Good bowel sounds. Extremities: no cyanosis, clubbing, rash.  1-2+ LE edema L>R.  Neuro: alert & orientedx3, cranial nerves grossly intact. Moves all 4 extremities w/o difficulty. Affect pleasant. Skin: Chest keloid present central aspect.   ASSESSMENT & PLAN:  1) Chronic systolic HF: Ischemic cardiomyopathy, EF 30% on 12/14  cMRI.  In 12/13, his ICD reached EOL and the ICD was explanted and leads were extracted.   - NYHA II symptoms and volume status slightly elevated - Continue current dose of coreg will not up titrate as he his hear rate is 50. He is taking Coreg 25 qam, 12.5 qpm.  HR 52 so will not adjust today.  - On ramipril to 2.5 mg daily. Will continue this dose.  - He is not spiro due to hyperkalemia.   - Volume status mildly elevated. Continue Lasix 40 mg daily, may use additional 40 mg with rising weight (as he has been doing). Add 2.5 mg metolazone as needed no more  than once a week.  - Echo today EF 30-35%  - He is a bit reluctant about having ICD replaced. But may consider S-ICD. Will refer back to Joseph. Graciela Husbands to discuss.  - Reinforced the need and importance of daily weights, a low sodium diet, and fluid restriction (less than 2 L a day). Instructed to call the HF clinic if weight increases more than 3 lbs overnight or 5 lbs in a week.  2) CAD: Scapular pain (anginal equivalent) has resolved post-PCI to SVG-RCA 1/15. He has had no bleeding complications in the last year.  Can stop plavix and continue coumadin and will restart baby aspirin. Marland Kitchen He will be life long coumadin.  Continue statin.  3) Hyperlipidemia: Crestor reduced to 20 due to low GFR.  4) OSA: Using CPAP. Marland Kitchen  5) PE, recurrent: Needs lifelong anti-coagulation 6) CKD: Followed by Nephrology.   Follow up 3 months.   CLEGG,AMY NP-C   Patient seen and examined with Tonye Becket, NP. We discussed all aspects of the encounter. I agree with the assessment and plan as stated above.   Overall doing well. NYHA II. Echo reviewed today EF 30-35% with moderate RV dysfunction. Volume status slightly elevated. Not responding very well to additional lasix will give metolazone 2.5 mg PRN for persistent fluid overload. CAD is stable. Can stop Plavix. Use ASA/warfarin. Refer to Joseph. Graciela Husbands re: S-ICD.   Eliaz Fout,MD 4:24 PM

## 2014-11-24 ENCOUNTER — Ambulatory Visit (INDEPENDENT_AMBULATORY_CARE_PROVIDER_SITE_OTHER): Payer: Medicare Other | Admitting: *Deleted

## 2014-11-24 DIAGNOSIS — I2699 Other pulmonary embolism without acute cor pulmonale: Secondary | ICD-10-CM

## 2014-11-24 DIAGNOSIS — Z5181 Encounter for therapeutic drug level monitoring: Secondary | ICD-10-CM

## 2014-11-24 DIAGNOSIS — I82401 Acute embolism and thrombosis of unspecified deep veins of right lower extremity: Secondary | ICD-10-CM

## 2014-11-24 DIAGNOSIS — I82A11 Acute embolism and thrombosis of right axillary vein: Secondary | ICD-10-CM

## 2014-11-24 DIAGNOSIS — I808 Phlebitis and thrombophlebitis of other sites: Secondary | ICD-10-CM

## 2014-11-24 LAB — POCT INR: INR: 2

## 2014-12-29 ENCOUNTER — Ambulatory Visit (INDEPENDENT_AMBULATORY_CARE_PROVIDER_SITE_OTHER): Payer: Medicare Other | Admitting: *Deleted

## 2014-12-29 DIAGNOSIS — I82A11 Acute embolism and thrombosis of right axillary vein: Secondary | ICD-10-CM

## 2014-12-29 DIAGNOSIS — I808 Phlebitis and thrombophlebitis of other sites: Secondary | ICD-10-CM | POA: Diagnosis not present

## 2014-12-29 DIAGNOSIS — I2699 Other pulmonary embolism without acute cor pulmonale: Secondary | ICD-10-CM

## 2014-12-29 DIAGNOSIS — Z5181 Encounter for therapeutic drug level monitoring: Secondary | ICD-10-CM | POA: Diagnosis not present

## 2014-12-29 LAB — POCT INR: INR: 2.4

## 2015-02-03 ENCOUNTER — Encounter (HOSPITAL_COMMUNITY): Payer: Self-pay

## 2015-02-08 ENCOUNTER — Ambulatory Visit: Payer: Self-pay | Admitting: Cardiology

## 2015-02-08 DIAGNOSIS — Z5181 Encounter for therapeutic drug level monitoring: Secondary | ICD-10-CM

## 2015-02-24 NOTE — Progress Notes (Signed)
[facility] called to report patient found deceased in home.  Dr. Shirlee Latch on phone to take report.  Ave Filter

## 2015-02-24 DEATH — deceased
